# Patient Record
Sex: Female | Born: 1955 | Race: White | Hispanic: No | Marital: Married | Smoking: Former smoker
Health system: Southern US, Community
[De-identification: ages and names within clinical notes are randomized; demographics above are authoritative.]

## PROBLEM LIST (undated history)

## (undated) DIAGNOSIS — R911 Solitary pulmonary nodule: Secondary | ICD-10-CM

## (undated) DIAGNOSIS — C801 Malignant (primary) neoplasm, unspecified: Secondary | ICD-10-CM

## (undated) HISTORY — DX: Solitary pulmonary nodule: R91.1

---

## 2021-03-26 ENCOUNTER — Encounter: Payer: Self-pay | Admitting: Emergency Medicine

## 2021-03-26 ENCOUNTER — Ambulatory Visit
Admission: EM | Admit: 2021-03-26 | Discharge: 2021-03-26 | Disposition: A | Discharge status: Home / Self Care | Payer: 59 | Attending: Family Medicine | Admitting: Family Medicine

## 2021-03-26 ENCOUNTER — Ambulatory Visit (INDEPENDENT_AMBULATORY_CARE_PROVIDER_SITE_OTHER): Payer: 59

## 2021-03-26 DIAGNOSIS — J189 Pneumonia, unspecified organism: Secondary | ICD-10-CM

## 2021-03-26 DIAGNOSIS — R059 Cough, unspecified: Secondary | ICD-10-CM | POA: Diagnosis not present

## 2021-03-26 MED ORDER — AZITHROMYCIN 250 MG PO TABS: ORAL_TABLET | ORAL | 0 refills | Status: DC

## 2021-03-26 MED ORDER — AMOXICILLIN-POT CLAVULANATE 875-125 MG PO TABS: 1.0000 | ORAL_TABLET | Freq: Two times a day (BID) | ORAL | 0 refills | Status: DC

## 2021-03-26 MED ORDER — BENZONATATE 200 MG PO CAPS: 200.0000 mg | ORAL_CAPSULE | Freq: Three times a day (TID) | ORAL | 0 refills | Status: DC | PRN

## 2021-03-26 NOTE — Discharge Instructions (Signed)
Antibiotic as prescribed.  Recommend follow up in 3-4 weeks.  Take care  Dr. Lacinda Axon

## 2021-03-26 NOTE — ED Triage Notes (Signed)
Patient c/o cough that started 1 month ago. She denies any other symptoms.

## 2021-03-28 NOTE — ED Provider Notes (Signed)
[facility]    CSN: [id number] Arrival date & time: 03/26/21  1544      History   Chief Complaint Chief Complaint  Patient presents with  . Cough   HPI  65 year old female presents with cough.  Patient reports a 1 month history of cough.  She states that she produces some clear sputum.  No fever.  No shortness of breath.  She states that she has tried over-the-counter medications and has had no relief in her cough.  She cannot seem to get it to go away.  She is a smoker.  No documented history of COPD.  Does not use any inhalers currently.  Denies any other symptoms.  No other complaints.  Home Medications    Prior to Admission medications   Medication Sig Start Date End Date Taking? Authorizing Provider  amoxicillin-clavulanate (AUGMENTIN) 875-125 MG tablet Take 1 tablet by mouth 2 (two) times daily. 03/26/21  Yes Ewing Fandino G, DO  azithromycin (ZITHROMAX) 250 MG tablet 2 tablets on day 1, then 1 tablet daily on days 2-5. 03/26/21  Yes Tequan Redmon G, DO  benzonatate (TESSALON) 200 MG capsule Take 1 capsule (200 mg total) by mouth 3 (three) times daily as needed for cough. 03/26/21  Yes Coral Spikes, DO   Social History Social History   Tobacco Use  . Smoking status: Current Every Day Smoker  . Smokeless tobacco: Never Used  Vaping Use  . Vaping Use: Never used  Substance Use Topics  . Alcohol use: Never  . Drug use: Never     Allergies   Patient has no known allergies.   Review of Systems Review of Systems  Constitutional: Negative for fever.  Respiratory: Positive for cough.    Physical Exam Triage Vital Signs ED Triage Vitals  Enc Vitals Group     BP 03/26/21 1707 (!) 155/70     Pulse Rate 03/26/21 1707 86     Resp 03/26/21 1707 18     Temp 03/26/21 1707 98.3 F (36.8 C)     Temp Source 03/26/21 1707 Oral     SpO2 03/26/21 1707 97 %     Weight --      Height 03/26/21 1705 4\' 11"  (1.499 m)     Head Circumference --      Peak Flow --       Pain Score 03/26/21 1705 0     Pain Loc --      Pain Edu? --      Excl. in [place]? --    Updated Vital Signs BP (!) 155/70 (BP Location: Left Arm)   Pulse 86   Temp 98.3 F (36.8 C) (Oral)   Resp 18   Ht 4\' 11"  (1.499 m)   SpO2 97%   Visual Acuity Right Eye Distance:   Left Eye Distance:   Bilateral Distance:    Right Eye Near:   Left Eye Near:    Bilateral Near:     Physical Exam Constitutional:      General: She is not in acute distress.    Appearance: Normal appearance. She is not ill-appearing.  HENT:     Head: Normocephalic and atraumatic.  Eyes:     General:        Right eye: No discharge.        Left eye: No discharge.     Conjunctiva/sclera: Conjunctivae normal.  Cardiovascular:     Rate and Rhythm: Normal rate and regular rhythm.  Pulmonary:  Effort: Pulmonary effort is normal.     Breath sounds: No wheezing.  Neurological:     Mental Status: She is alert.  Psychiatric:        Mood and Affect: Mood normal.        Behavior: Behavior normal.    UC Treatments / Results  Labs (all labs ordered are listed, but only abnormal results are displayed) Labs Reviewed - No data to display  EKG   Radiology DG Chest 2 View  Result Date: 03/26/2021 CLINICAL DATA:  Cough for 1 month. EXAM: CHEST - 2 VIEW COMPARISON:  None. FINDINGS: Heart size and mediastinal contours appear normal. There is airspace opacification within the right middle lobe concerning for pneumonia. Left lung is clear. Visualized osseous structures are unremarkable. IMPRESSION: Right middle lobe pneumonia. Followup PA and lateral chest X-ray is recommended in 3-4 weeks following trial of antibiotic therapy to ensure resolution and exclude underlying malignancy. Electronically Signed   By: Kerby Moors M.D.   On: 03/26/2021 17:56    Procedures Procedures (including critical care time)  Medications Ordered in UC Medications - No data to display  Initial Impression / Assessment and Plan / UC  Course  I have reviewed the triage vital signs and the nursing notes.  Pertinent labs & imaging results that were available during my care of the patient were reviewed by me and considered in my medical decision making (see chart for details).    65 year old female presents with cough.  Chest x-ray obtained today.  Independent interpretation: Right middle lobe pneumonia.  Patient placed on Augmentin and azithromycin.  Tessalon Perles for cough.  Advised her that she needs follow-up chest x-ray in 3 to 4 weeks to ensure resolution.  Final Clinical Impressions(s) / UC Diagnoses   Final diagnoses:  Community acquired pneumonia of right middle lobe of lung     Discharge Instructions     Antibiotic as prescribed.  Recommend follow up in 3-4 weeks.  Take care  Dr. Lacinda Axon    ED Prescriptions    Medication Sig Dispense Auth. Provider   amoxicillin-clavulanate (AUGMENTIN) 875-125 MG tablet Take 1 tablet by mouth 2 (two) times daily. 20 tablet Xzayvier Fagin G, DO   azithromycin (ZITHROMAX) 250 MG tablet 2 tablets on day 1, then 1 tablet daily on days 2-5. 6 tablet Braylea Brancato G, DO   benzonatate (TESSALON) 200 MG capsule Take 1 capsule (200 mg total) by mouth 3 (three) times daily as needed for cough. 30 capsule Coral Spikes, DO     PDMP not reviewed this encounter.   Coral Spikes, [place] 03/28/21 [phone]

## 2021-04-12 ENCOUNTER — Ambulatory Visit
Admission: RE | Admit: 2021-04-12 | Discharge: 2021-04-12 | Disposition: A | Discharge status: Home / Self Care | Payer: 59 | Source: Ambulatory Visit | Attending: Physician Assistant | Admitting: Physician Assistant

## 2021-04-12 ENCOUNTER — Ambulatory Visit (INDEPENDENT_AMBULATORY_CARE_PROVIDER_SITE_OTHER): Payer: 59

## 2021-04-12 ENCOUNTER — Ambulatory Visit (INDEPENDENT_AMBULATORY_CARE_PROVIDER_SITE_OTHER)
Admission: RE | Admit: 2021-04-12 | Discharge: 2021-04-12 | Disposition: A | Discharge status: Home / Self Care | Payer: 59 | Source: Ambulatory Visit | Attending: Emergency Medicine | Admitting: Emergency Medicine

## 2021-04-12 ENCOUNTER — Other Ambulatory Visit: Payer: Self-pay

## 2021-04-12 VITALS — BP 137/54 | HR 92 | Temp 98.4°F | Resp 22 | Ht 59.0 in

## 2021-04-12 DIAGNOSIS — R053 Chronic cough: Secondary | ICD-10-CM

## 2021-04-12 DIAGNOSIS — R059 Cough, unspecified: Secondary | ICD-10-CM

## 2021-04-12 DIAGNOSIS — Z720 Tobacco use: Secondary | ICD-10-CM | POA: Diagnosis not present

## 2021-04-12 DIAGNOSIS — R918 Other nonspecific abnormal finding of lung field: Secondary | ICD-10-CM

## 2021-04-12 DIAGNOSIS — J438 Other emphysema: Secondary | ICD-10-CM | POA: Diagnosis not present

## 2021-04-12 MED ORDER — LEVOFLOXACIN 750 MG PO TABS: 750.0000 mg | ORAL_TABLET | Freq: Every day | ORAL | 0 refills | Status: AC

## 2021-04-12 MED ORDER — ALBUTEROL SULFATE HFA 108 (90 BASE) MCG/ACT IN AERS: 1.0000 | INHALATION_SPRAY | Freq: Four times a day (QID) | RESPIRATORY_TRACT | 0 refills | Status: DC | PRN

## 2021-04-12 NOTE — ED Notes (Signed)
PA# for CT scan [id number]

## 2021-04-12 NOTE — Discharge Instructions (Signed)
You have a lung mass.  This may be infectious but could be something else such as a cancer.  Additionally you do have emphysema.  This is a lung disease due to smoking.  You need to stop smoking.  You need to see a pulmonologist.  I have placed a stat referral for you to see a pulmonologist.  There is a contact number on there.  If you have not heard anything by Wednesday you should call that number.  I have sent in a stronger antibiotic to treat for potential infection.  Have also sent an inhaler for you to use if you feel short of breath.  If any severe acute worsening of symptoms such as acute worsening of your cough, blood in the sputum, chest pain, shortness of breath or wheezing or fatigue you need to call 911 or go to the ER.

## 2021-04-12 NOTE — ED Provider Notes (Signed)
[facility]    CSN: [id number] Arrival date & time: 04/12/21  1457      History   Chief Complaint Chief Complaint  Patient presents with  . Cough    HPI Cathy Glover is a 65 y.o. female presenting for approximately 1.9-month history of cough.  Cough is occasionally productive.  Patient seen at [facility] urgent care on 03/26/2021 and had a chest x-ray which showed right middle lobe consolidation.  Treated with Augmentin and azithromycin for suspected pneumonia.  Patient says that she does not feel any better after completing antibiotics and does feel little worse.  Over the past couple of days she is started to have some voice hoarseness.  She denies any fevers or fatigue.  No chest pain or tightness.  No wheezing or breathing difficulty.  Patient denies any history of COPD or asthma.  No history of heart disease.  Patient is a current longtime smoker.  States that she smoked about half a pack a day for nearly 30 years.  She never had any COVID exposure.  Patient says she does not have any known medical problems, but she also does not have a PCP or received regular physicals.  She denies any other symptoms or complaints at this time.  HPI  History reviewed. No pertinent past medical history.  There are no problems to display for this patient.   History reviewed. No pertinent surgical history.  OB History   No obstetric history on file.      Home Medications    Prior to Admission medications   Medication Sig Start Date End Date Taking? Authorizing Provider  albuterol (VENTOLIN HFA) 108 (90 Base) MCG/ACT inhaler Inhale 1-2 puffs into the lungs every 6 (six) hours as needed for wheezing or shortness of breath. 04/12/21 04/12/22 Yes Danton Clap, PA-C  levofloxacin (LEVAQUIN) 750 MG tablet Take 1 tablet (750 mg total) by mouth daily for 5 days. 04/12/21 04/17/21 Yes Danton Clap, PA-C  benzonatate (TESSALON) 200 MG capsule Take 1 capsule (200 mg total) by mouth 3 (three)  times daily as needed for cough. 03/26/21   Coral Spikes, DO    Family History No family history on file.  Social History Social History   Tobacco Use  . Smoking status: Current Every Day Smoker  . Smokeless tobacco: Never Used  Vaping Use  . Vaping Use: Never used  Substance Use Topics  . Alcohol use: Never  . Drug use: Never     Allergies   Patient has no known allergies.   Review of Systems Review of Systems  Constitutional: Negative for chills, diaphoresis, fatigue and fever.  HENT: Positive for voice change. Negative for congestion, ear pain, rhinorrhea, sinus pressure, sinus pain and sore throat.   Respiratory: Positive for cough. Negative for shortness of breath and wheezing.   Cardiovascular: Negative for chest pain.  Gastrointestinal: Negative for abdominal pain, nausea and vomiting.  Musculoskeletal: Negative for arthralgias and myalgias.  Skin: Negative for rash.  Neurological: Negative for weakness and headaches.  Hematological: Negative for adenopathy.     Physical Exam Triage Vital Signs ED Triage Vitals  Enc Vitals Group     BP 04/12/21 1514 (!) 137/54     Pulse Rate 04/12/21 1514 92     Resp 04/12/21 1514 (!) 22     Temp 04/12/21 1514 98.4 F (36.9 C)     Temp Source 04/12/21 1514 Oral     SpO2 04/12/21 1514 93 %  Weight --      Height 04/12/21 1512 4\' 11"  (1.499 m)     Head Circumference --      Peak Flow --      Pain Score 04/12/21 1512 0     Pain Loc --      Pain Edu? --      Excl. in [place]? --    No data found.  Updated Vital Signs BP (!) 137/54 (BP Location: Right Arm)   Pulse 92   Temp 98.4 F (36.9 C) (Oral)   Resp (!) 22   Ht 4\' 11"  (1.499 m)   SpO2 93%       Physical Exam Vitals and nursing note reviewed.  Constitutional:      General: She is not in acute distress.    Appearance: Normal appearance. She is not ill-appearing or toxic-appearing.  HENT:     Head: Normocephalic and atraumatic.     Nose: Nose normal.      Mouth/Throat:     Mouth: Mucous membranes are moist.     Pharynx: Oropharynx is clear.  Eyes:     General: No scleral icterus.       Right eye: No discharge.        Left eye: No discharge.     Conjunctiva/sclera: Conjunctivae normal.  Cardiovascular:     Rate and Rhythm: Normal rate and regular rhythm.     Heart sounds: Normal heart sounds.  Pulmonary:     Effort: Pulmonary effort is normal. No respiratory distress.     Breath sounds: Wheezing (few scattered wheezes throughout) present.  Musculoskeletal:     Cervical back: Neck supple.  Skin:    General: Skin is dry.  Neurological:     General: No focal deficit present.     Mental Status: She is alert. Mental status is at baseline.     Motor: No weakness.     Gait: Gait normal.  Psychiatric:        Mood and Affect: Mood normal.        Behavior: Behavior normal.        Thought Content: Thought content normal.      UC Treatments / Results  Labs (all labs ordered are listed, but only abnormal results are displayed) Labs Reviewed - No data to display  EKG   Radiology DG Chest 2 View  Result Date: 04/12/2021 CLINICAL DATA:  Chronic cough. EXAM: CHEST - 2 VIEW COMPARISON:  Chest x-ray dated Mar 26, 2021. FINDINGS: The heart size and mediastinal contours are within normal limits. Normal pulmonary vascularity. Persistent and mildly worsened consolidation in the right middle lobe. No pleural effusion or pneumothorax. No acute osseous abnormality. IMPRESSION: 1. Mildly worsened consolidation in the right middle lobe. Given persistence, further evaluation with chest CT is recommended. Electronically Signed   By: Titus Dubin M.D.   On: 04/12/2021 15:44   CT Chest Wo Contrast  Result Date: 04/12/2021 CLINICAL DATA:  Respiratory illness. Right middle lobe consolidation. EXAM: CT CHEST WITHOUT CONTRAST TECHNIQUE: Multidetector CT imaging of the chest was performed following the standard protocol without IV contrast. COMPARISON:   None. FINDINGS: Cardiovascular: The heart size is normal. Heart size is within normal limits. Small pericardial effusion noted. Coronary artery calcification is evident. Atherosclerotic calcification is noted in the wall of the thoracic aorta. Mediastinum/Nodes: Mediastinal lymphadenopathy evident including 17 mm right paratracheal node on 47/2. Bulky 2.8 cm short axis subcarinal lymph node evident. Right hilar lymphadenopathy noted. There may be some  lymphadenopathy in left hilum is well. There is no axillary lymphadenopathy. Lungs/Pleura: Centrilobular emphsyema noted. Multifocal ill-defined nodular consolidative disease is identified in the central right upper lobe, central right middle lobe, and left lower lobe. There is associated volume loss and consolidative opacity in the right middle and lower lobes. Index right lower lobe nodular component measures 2.0 x 1.6 cm on image 77/3. No pleural effusion. Upper Abdomen: 19 mm low-density lesion upper pole right kidney measures water density, likely a cyst. 2.1 cm left adrenal nodule has attenuation too high to be characterized as an adenoma. Musculoskeletal: No worrisome lytic or sclerotic osseous abnormality. IMPRESSION: 1. Multifocal ill-defined nodular consolidative disease in the central right upper lobe, central right middle lobe, and left lower lobe. Index right lower lobe nodular component measures up to 2.0 cm. Findings are associated with bulky mediastinal and right hilar lymphadenopathy. Given persistence, multifocal neoplasm (adenocarcinoma) is a concern. Infectious etiology not excluded. Pulmonary consultation recommended and close follow-up warranted. 2. 2.1 cm left adrenal nodule has attenuation too high to be characterized as an adenoma. Close follow-up recommended. 3. Aortic Atherosclerosis (ICD10-I70.0) and Emphysema (ICD10-J43.9). Electronically Signed   By: Misty Stanley M.D.   On: 04/12/2021 17:29    Procedures Procedures (including critical  care time)  Medications Ordered in UC Medications - No data to display  Initial Impression / Assessment and Plan / UC Course  I have reviewed the triage vital signs and the nursing notes.  Pertinent labs & imaging results that were available during my care of the patient were reviewed by me and considered in my medical decision making (see chart for details).    65 year old female returning for continued cough.  Patient seen about 2 weeks ago and diagnosed with pneumonia based on consolidation of the right middle lobe.  Patient says she has not gotten any better and feels little worse.  New onset of voice hoarseness started yesterday.  No other new symptoms.  She is denying any shortness of breath or breathing difficulty.  Oxygen at last visit was 97% oxygen saturation today is 93%.  She is not in any respiratory distress.  Exam reveals a few wheezes here and there throughout chest.  Heart regular rate and rhythm.  She is not ill-appearing.  Repeat chest x-ray today shows mildly worsening consolidation in the right middle lobe.  Radiologist recommends chest CT.  I spoke with radiologist Dr. Nelia Shi and he stated that chest CT without contrast would be a fine test ordered.  I have order chest CT without contrast to further characterize this area of consolidation to assess if this looks like pneumonia, abscess, or possible malignancy.  CT today shows evidence of emphysema and multifocal ill-defined nodular consolidative disease in the right upper, right middle and left lower lobes.  Radiologist states that this is concerning for malignant neoplasm specifically adenocarcinoma.  Infectious etiology is not excluded either.  They recommend follow-up with pulmonologist.  I have reviewed the CT results with patient.  Advised her this could be infection versus malignancy.  I have placed a stat referral to pulmonologist for further work-up.  I have sent in Levaquin in case of infectious etiology and also  albuterol as needed for any shortness of breath but she is denying shortness of breath.  Reviewed ED red flag signs and symptoms with patient.  Encouraged her to stop smoking.  Advised her that it is very important that she follow through with the pulmonology visit.  Advised her to contact pulmonologist next  week if she has not heard anything to let us know so we can place another referral if needed.  Patient is agreeable.   Final Clinical Impressions(s) / UC Diagnoses   Final diagnoses:  Lung mass  Cough  Other emphysema (HCC)  Tobacco abuse disorder     Discharge Instructions     You have a lung mass.  This may be infectious but could be something else such as a cancer.  Additionally you do have emphysema.  This is a lung disease due to smoking.  You need to stop smoking.  You need to see a pulmonologist.  I have placed a stat referral for you to see a pulmonologist.  There is a contact number on there.  If you have not heard anything by Wednesday you should call that number.  I have sent in a stronger antibiotic to treat for potential infection.  Have also sent an inhaler for you to use if you feel short of breath.  If any severe acute worsening of symptoms such as acute worsening of your cough, blood in the sputum, chest pain, shortness of breath or wheezing or fatigue you need to call 911 or go to the ER.    ED Prescriptions    Medication Sig Dispense Auth. Provider   levofloxacin (LEVAQUIN) 750 MG tablet Take 1 tablet (750 mg total) by mouth daily for 5 days. 5 tablet Laurene Footman B, PA-C   albuterol (VENTOLIN HFA) 108 (90 Base) MCG/ACT inhaler Inhale 1-2 puffs into the lungs every 6 (six) hours as needed for wheezing or shortness of breath. 1 g Danton Clap, PA-C     PDMP not reviewed this encounter.   Danton Clap, PA-C 04/12/21 [phone]

## 2021-04-12 NOTE — ED Triage Notes (Signed)
Patient c/o cough that started 1.5 months ago. Reports she was diagnosed with pneumonia on 05/16 and given Augmentin. She states she completed her antibiotic and still continues to cough, no improvement.

## 2021-04-17 ENCOUNTER — Telehealth: Payer: Self-pay | Admitting: Physician Assistant

## 2021-04-17 ENCOUNTER — Encounter: Payer: Self-pay | Admitting: *Deleted

## 2021-04-17 DIAGNOSIS — R918 Other nonspecific abnormal finding of lung field: Secondary | ICD-10-CM

## 2021-04-17 NOTE — Progress Notes (Signed)
Referral received for patient to establish care with oncology for lung mass seen on recent CT scan. Pt has an appt with pulmonary in July. Spoke with Dr. Patsey Berthold who recommends for patient to come in sooner. Appt rescheduled to 6/8 at 1:30pm and pt made aware by clinic staff. Pt will need PET scan and authorization with insurance is currently pending. Once approved, will schedule patient for PET scan and oncology consult.   Attempted to contact pt to update but pt did not answer and there was not any voicemail set up to leave a message. Will try again.

## 2021-04-17 NOTE — Telephone Encounter (Signed)
Patient awaiting pulmonology appointment in July 2022. Placing urgent referral to heme/onc as well.

## 2021-04-18 ENCOUNTER — Other Ambulatory Visit: Payer: Self-pay

## 2021-04-18 ENCOUNTER — Ambulatory Visit (INDEPENDENT_AMBULATORY_CARE_PROVIDER_SITE_OTHER): Payer: 59 | Admitting: Pulmonary Disease

## 2021-04-18 ENCOUNTER — Encounter: Payer: Self-pay | Admitting: Pulmonary Disease

## 2021-04-18 ENCOUNTER — Telehealth: Payer: Self-pay | Admitting: Pulmonary Disease

## 2021-04-18 VITALS — BP 128/60 | HR 82 | Temp 98.4°F | Ht 59.0 in | Wt 111.0 lb

## 2021-04-18 DIAGNOSIS — R590 Localized enlarged lymph nodes: Secondary | ICD-10-CM | POA: Diagnosis not present

## 2021-04-18 DIAGNOSIS — Z87891 Personal history of nicotine dependence: Secondary | ICD-10-CM

## 2021-04-18 DIAGNOSIS — R058 Other specified cough: Secondary | ICD-10-CM | POA: Diagnosis not present

## 2021-04-18 DIAGNOSIS — J181 Lobar pneumonia, unspecified organism: Secondary | ICD-10-CM | POA: Diagnosis not present

## 2021-04-18 NOTE — H&P (View-Only) (Signed)
Subjective:    Patient ID: Cathy Glover, female    DOB: 1956/09/14, 65 y.o.   MRN: [id number] Chief Complaint  Patient presents with   pulmonary consult    CT 04/12/2021. C/o occ sob with exertion and prod cough with clear sputum and wheezing with coughing spell.    HPI  Patient is a 65 year old recent former smoker (quit 1 week ago) with a 25-pack-year history of smoking who presents for evaluation of a lung mass with mediastinal adenopathy.  The patient is referred by the [facility] and the [place].  The patient presents today with her husband who is present during the entirety of the visit.  Per the patient and the husband she developed a cough approximately 2 years ago.  Mostly in the mornings.  They attributed this to "smoker's cough".  The cough has been for the most part nonproductive, no hemoptysis.  Occasionally she will have production of whitish to grayish sputum.  Approximately 6 months ago her cough got worse.  It has not been associated with fevers, chills or sweats.  She has not had any weight loss.  No anorexia.  She was evaluated at the urgent care on May 16 and at that time a chest x-ray showed consolidative process on the right lower lobe.  She was treated with Augmentin and azithromycin as an outpatient and was also given Tessalon Perles.  She initially improved somewhat but then her symptoms worsened again and she was again evaluated on 2 June at the urgent care and [place].  By that time the patient had started to develop some hoarseness as well.  A chest x-ray at that time showed that the consolidation noted previously was more pronounced.  A CT was recommended, CT done the same day showed "ill-defined nodular consolidative disease in the central right upper lobe, middle lobe and left lower lobe.  Additionally the patient have bulky mediastinal adenopathy and right hilar adenopathy.  There is also evidence of an adrenal nodule that is not consistent with adenoma.   Given these findings the likelihood of carcinoma is high.  The patient was then seen at the [facility] emergency room on 7 June because of worsening cough.  She was instructed to keep up her follow-up appointment with [place] today.  The patient states that her cough actually does improve with Mucinex DM.  She is also using as needed albuterol and this helps as well.  She does not endorse any other symptomatology.  She does not have a regular physician and does not keep up with regular medical follow-ups.  We did show the patient and her husband all her imaging and the rationale of need for biopsy.  They are aware of the need for diagnosis ASAP.  Review of Systems A 10 point review of systems was performed and it is as noted above otherwise negative.  Past Medical History:  Diagnosis Date   Pulmonary nodule    No past surgical history on file.  Family History  Problem Relation Age of Onset   Cancer Sister    Social History   Tobacco Use   Smoking status: Former Smoker    Packs/day: 0.50    Years: 50.00    Pack years: 25.00    Quit date: 04/11/2021    Years since quitting: 0.0   Smokeless tobacco: Never Used  Substance Use Topics   Alcohol use: Never   No Known Allergies  Current Meds  Medication Sig   albuterol (VENTOLIN HFA)  108 (90 Base) MCG/ACT inhaler Inhale 1-2 puffs into the lungs every 6 (six) hours as needed for wheezing or shortness of breath.   dextromethorphan-guaiFENesin (MUCINEX DM) 30-600 MG 12hr tablet Take 1 tablet by mouth 2 (two) times daily.    There is no immunization history on file for this patient. We discussed Covid-19 precautions.  I reviewed the vaccine effectiveness and potential side effects in detail to include differences between mRNA vaccines and traditional vaccines (attenuated virus).  Discussion also offered of long-term effectiveness and safety profile which are unclear at this time.  Discussed current CDC guidance that all patients are recommended  COVID-19 vaccinations -as long as they do not have allergy to components of the vaccine.  .    Objective:   Physical Exam BP 128/60 (BP Location: Left Arm, Cuff Size: Normal)   Pulse 82   Temp 98.4 F (36.9 C) (Temporal)   Ht 4\' 11"  (1.499 m)   Wt 111 lb (50.3 kg)   SpO2 94%   BMI 22.42 kg/m  GENERAL: Well-developed, well-nourished woman, no acute distress.  Looks older than stated age.  Fully ambulatory.  Occasionally befuddled. HEAD: Normocephalic, atraumatic.  EYES: Pupils equal, round, reactive to light.  No scleral icterus.  MOUTH: Nose/mouth/throat not examined due to masking requirements for COVID 19. NECK: Supple. No thyromegaly. Trachea midline. No JVD.  No adenopathy. PULMONARY: Good air entry bilaterally.  Coarse, otherwise, no adventitious sounds. CARDIOVASCULAR: S1 and S2. Regular rate and rhythm.  No rubs, murmurs or gallops heard. ABDOMEN: Benign. MUSCULOSKELETAL: No joint deformity, no clubbing, no edema.  NEUROLOGIC: No focal deficit, no gait disturbance, speech is fluent. SKIN: Intact,warm,dry.  On limited exam no rashes. PSYCH: Flat affect, occasionally befuddled, normal behavior.  Representative images of the CT performed 12 April 2021:      Assessment & Plan:     ICD-10-CM   1. Mediastinal adenopathy  R59.0    Patient has bulky mediastinal adenopathy which is carcinoma until proven otherwise Will need diagnosis with biopsy Patient has been scheduled for EBUS/TBNA     2. 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 Cough present for greater than 3 weeks  R05.8    Likely related to chest process as above    4. Former moderate cigarette smoker (10-19 per day)  Z87.891    Recent former smoker Discussed strategies to avoid relapse     Discussion:   The patient has carcinoma until proven otherwise.  The benefits, limitations and complications of  endobronchial ultrasound with biopsy were explained to the patient and her husband.  They agree to go ahead with the procedure.  It has been scheduled for 25 April 2021.  Benefits, limitations and potential complications of the procedure were discussed with the patient/family  including, but not limited to bleeding, hemoptysis, respiratory failure requiring intubation and/or prolongued mechanical ventilation, infection, pneumothorax (collapse of lung) requiring chest tube placement, stroke or even death.  Patient and husband agree for the patient to procedure.   We will coordinate patient's follow-up with oncology.  Renold Don, MD [place] [facility]   *This note was dictated using voice recognition software/Dragon.  Despite best efforts to proofread, errors can occur which can change the meaning.  Any change was purely unintentional.

## 2021-04-18 NOTE — Telephone Encounter (Signed)
EBUS scheduled for 04/25/2021 at 12:30. [id number]  EB:[id number] adenopathy   Rodena Piety, please see bronch info

## 2021-04-18 NOTE — Progress Notes (Signed)
Subjective:    Patient ID: Cathy Glover, female    DOB: 05-23-1956, 65 y.o.   MRN: [id number] Chief Complaint  Patient presents with   pulmonary consult    CT 04/12/2021. C/o occ sob with exertion and prod cough with clear sputum and wheezing with coughing spell.    HPI  Patient is a 65 year old recent former smoker (quit 1 week ago) with a 25-pack-year history of smoking who presents for evaluation of a lung mass with mediastinal adenopathy.  The patient is referred by the [facility] and the [place].  The patient presents today with her husband who is present during the entirety of the visit.  Per the patient and the husband she developed a cough approximately 2 years ago.  Mostly in the mornings.  They attributed this to "smoker's cough".  The cough has been for the most part nonproductive, no hemoptysis.  Occasionally she will have production of whitish to grayish sputum.  Approximately 6 months ago her cough got worse.  It has not been associated with fevers, chills or sweats.  She has not had any weight loss.  No anorexia.  She was evaluated at the urgent care on May 16 and at that time a chest x-ray showed consolidative process on the right lower lobe.  She was treated with Augmentin and azithromycin as an outpatient and was also given Tessalon Perles.  She initially improved somewhat but then her symptoms worsened again and she was again evaluated on 2 June at the urgent care and [place].  By that time the patient had started to develop some hoarseness as well.  A chest x-ray at that time showed that the consolidation noted previously was more pronounced.  A CT was recommended, CT done the same day showed "ill-defined nodular consolidative disease in the central right upper lobe, middle lobe and left lower lobe.  Additionally the patient have bulky mediastinal adenopathy and right hilar adenopathy.  There is also evidence of an adrenal nodule that is not consistent with adenoma.   Given these findings the likelihood of carcinoma is high.  The patient was then seen at the [facility] emergency room on 7 June because of worsening cough.  She was instructed to keep up her follow-up appointment with [place] today.  The patient states that her cough actually does improve with Mucinex DM.  She is also using as needed albuterol and this helps as well.  She does not endorse any other symptomatology.  She does not have a regular physician and does not keep up with regular medical follow-ups.  We did show the patient and her husband all her imaging and the rationale of need for biopsy.  They are aware of the need for diagnosis ASAP.  Review of Systems A 10 point review of systems was performed and it is as noted above otherwise negative.  Past Medical History:  Diagnosis Date   Pulmonary nodule    No past surgical history on file.  Family History  Problem Relation Age of Onset   Cancer Sister    Social History   Tobacco Use   Smoking status: Former Smoker    Packs/day: 0.50    Years: 50.00    Pack years: 25.00    Quit date: 04/11/2021    Years since quitting: 0.0   Smokeless tobacco: Never Used  Substance Use Topics   Alcohol use: Never   No Known Allergies  Current Meds  Medication Sig   albuterol (VENTOLIN HFA)  108 (90 Base) MCG/ACT inhaler Inhale 1-2 puffs into the lungs every 6 (six) hours as needed for wheezing or shortness of breath.   dextromethorphan-guaiFENesin (MUCINEX DM) 30-600 MG 12hr tablet Take 1 tablet by mouth 2 (two) times daily.    There is no immunization history on file for this patient. We discussed Covid-19 precautions.  I reviewed the vaccine effectiveness and potential side effects in detail to include differences between mRNA vaccines and traditional vaccines (attenuated virus).  Discussion also offered of long-term effectiveness and safety profile which are unclear at this time.  Discussed current CDC guidance that all patients are recommended  COVID-19 vaccinations -as long as they do not have allergy to components of the vaccine.  .    Objective:   Physical Exam BP 128/60 (BP Location: Left Arm, Cuff Size: Normal)   Pulse 82   Temp 98.4 F (36.9 C) (Temporal)   Ht 4\' 11"  (1.499 m)   Wt 111 lb (50.3 kg)   SpO2 94%   BMI 22.42 kg/m  GENERAL: Well-developed, well-nourished woman, no acute distress.  Looks older than stated age.  Fully ambulatory.  Occasionally befuddled. HEAD: Normocephalic, atraumatic.  EYES: Pupils equal, round, reactive to light.  No scleral icterus.  MOUTH: Nose/mouth/throat not examined due to masking requirements for COVID 19. NECK: Supple. No thyromegaly. Trachea midline. No JVD.  No adenopathy. PULMONARY: Good air entry bilaterally.  Coarse, otherwise, no adventitious sounds. CARDIOVASCULAR: S1 and S2. Regular rate and rhythm.  No rubs, murmurs or gallops heard. ABDOMEN: Benign. MUSCULOSKELETAL: No joint deformity, no clubbing, no edema.  NEUROLOGIC: No focal deficit, no gait disturbance, speech is fluent. SKIN: Intact,warm,dry.  On limited exam no rashes. PSYCH: Flat affect, occasionally befuddled, normal behavior.  Representative images of the CT performed 12 April 2021:      Assessment & Plan:     ICD-10-CM   1. Mediastinal adenopathy  R59.0    Patient has bulky mediastinal adenopathy which is carcinoma until proven otherwise Will need diagnosis with biopsy Patient has been scheduled for EBUS/TBNA     2. 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 Cough present for greater than 3 weeks  R05.8    Likely related to chest process as above    4. Former moderate cigarette smoker (10-19 per day)  Z87.891    Recent former smoker Discussed strategies to avoid relapse     Discussion:   The patient has carcinoma until proven otherwise.  The benefits, limitations and complications of  endobronchial ultrasound with biopsy were explained to the patient and her husband.  They agree to go ahead with the procedure.  It has been scheduled for 25 April 2021.  Benefits, limitations and potential complications of the procedure were discussed with the patient/family  including, but not limited to bleeding, hemoptysis, respiratory failure requiring intubation and/or prolongued mechanical ventilation, infection, pneumothorax (collapse of lung) requiring chest tube placement, stroke or even death.  Patient and husband agree for the patient to procedure.   We will coordinate patient's follow-up with oncology.  Renold Don, MD [place] [facility]   *This note was dictated using voice recognition software/Dragon.  Despite best efforts to proofread, errors can occur which can change the meaning.  Any change was purely unintentional.

## 2021-04-18 NOTE — Patient Instructions (Signed)
We are scheduling your procedure for a biopsy on 15 June  You may take Mucinex DM extra strength (1200 mg) 1 tablet twice a day as needed for cough.  If you have the regular strength tablets (600 mg) you can actually take 2 tablets twice a day.

## 2021-04-19 ENCOUNTER — Encounter: Payer: Self-pay | Admitting: Pulmonary Disease

## 2021-04-19 NOTE — Telephone Encounter (Signed)
Prior Auth Not Required for codes [phone], [id number] Refer # [id number]

## 2021-04-20 ENCOUNTER — Encounter: Payer: Self-pay | Admitting: *Deleted

## 2021-04-20 ENCOUNTER — Other Ambulatory Visit: Payer: Self-pay

## 2021-04-20 ENCOUNTER — Encounter
Admission: RE | Admit: 2021-04-20 | Discharge: 2021-04-20 | Disposition: A | Discharge status: Home / Self Care | Payer: 59 | Source: Ambulatory Visit | Attending: Pulmonary Disease | Admitting: Pulmonary Disease

## 2021-04-20 NOTE — Patient Instructions (Signed)
Your procedure is scheduled on: 04/25/21 Report to [facility]. To find out your arrival time please call [phone] between 1PM - 3PM on 04/24/21.  Remember: Instructions that are not followed completely may result in serious medical risk, up to and including death, or upon the discretion of your surgeon and anesthesiologist your surgery may need to be rescheduled.     _X__ 1. Do not eat food or drink any liquids after midnight the night before your procedure.                 No gum chewing or hard candies.   __X__2.  On the morning of surgery brush your teeth with toothpaste and water, you                 may rinse your mouth with mouthwash if you wish.  Do not swallow any              toothpaste of mouthwash.     _X__ 3.  No Alcohol for 24 hours before or after surgery.   _X__ 4.  Do Not Smoke or use e-cigarettes For 24 Hours Prior to Your Surgery.                 Do not use any chewable tobacco products for at least 6 hours prior to                 surgery.  ____  5.  Bring all medications with you on the day of surgery if instructed.   __X__  6.  Notify your doctor if there is any change in your medical condition      (cold, fever, infections).     Do not wear jewelry, make-up, hairpins, clips or nail polish. Do not wear lotions, powders, or perfumes.  Do not shave 48 hours prior to surgery. Men may shave face and neck. Do not bring valuables to the hospital.    [facility] is not responsible for any belongings or valuables.  Contacts, dentures/partials or body piercings may not be worn into surgery. Bring a case for your contacts, glasses or hearing aids, a denture cup will be supplied. Leave your suitcase in the car. After surgery it may be brought to your room. For patients admitted to the hospital, discharge time is determined by your treatment team.   Patients discharged the day of surgery will not be allowed to drive  home.   Please read over the following fact sheets that you were given:     __X__ Take these medicines the morning of surgery with A SIP OF WATER:    1. none  2.   3.   4.  5.  6.  ____ Fleet Enema (as directed)   ____ Use CHG Soap/SAGE wipes as directed  __X__ Use inhalers on the day of surgery [place]  ____ Stop metformin/Janumet/Farxiga 2 days prior to surgery    ____ Take 1/2 of usual insulin dose the night before surgery. No insulin the morning          of surgery.   ____ Stop Blood Thinners Coumadin/Plavix/Xarelto/Pleta/Pradaxa/Eliquis/Effient/Aspirin  on   Or contact your Surgeon, Cardiologist or Medical Doctor regarding  ability to stop your blood thinners  __X__ Stop Anti-inflammatories 7 days before surgery such as Advil, Ibuprofen, Motrin,  BC or Goodies Powder, Naprosyn, Naproxen, Aleve, Aspirin    __X__ Stop all herbal supplements, fish oil  or vitamin E until after surgery.    ____ Bring C-Pap to the hospital.

## 2021-04-20 NOTE — Progress Notes (Signed)
Phone call made to patient to review new patient appt with Dr. Rogue Bussing on 6/21. All questions answered during phone call. Contact info given and instructed to call with any further questions or needs. Pt verbalized understanding.

## 2021-04-20 NOTE — Telephone Encounter (Signed)
Phone pre admit 04/20/2021 at 2:30 and covid test 04/23/2021 at 8:25. Patient is aware of dates/times and voiced her understanding.  Nothing further needed at this time.

## 2021-04-23 ENCOUNTER — Other Ambulatory Visit
Admission: RE | Admit: 2021-04-23 | Discharge: 2021-04-23 | Disposition: A | Discharge status: Home / Self Care | Payer: 59 | Source: Ambulatory Visit | Attending: Pulmonary Disease | Admitting: Pulmonary Disease

## 2021-04-23 ENCOUNTER — Other Ambulatory Visit: Payer: 59

## 2021-04-23 ENCOUNTER — Other Ambulatory Visit: Payer: Self-pay

## 2021-04-23 ENCOUNTER — Other Ambulatory Visit: Admission: RE | Admit: 2021-04-23 | Payer: 59 | Source: Ambulatory Visit

## 2021-04-23 DIAGNOSIS — Z20822 Contact with and (suspected) exposure to covid-19: Secondary | ICD-10-CM | POA: Diagnosis not present

## 2021-04-23 DIAGNOSIS — Z01818 Encounter for other preprocedural examination: Secondary | ICD-10-CM | POA: Insufficient documentation

## 2021-04-23 LAB — SARS CORONAVIRUS 2 (TAT 6-24 HRS): SARS Coronavirus 2: NEGATIVE

## 2021-04-24 ENCOUNTER — Telehealth: Payer: Self-pay | Admitting: Pulmonary Disease

## 2021-04-24 NOTE — Telephone Encounter (Signed)
LMTCB  Will route message to [facility] since patient see's Dr Patsey Berthold.

## 2021-04-24 NOTE — Telephone Encounter (Signed)
Received call from [name] (husband) regardi-ng her medications in the morning prior to her procedure.  Advised he would need to call [phone] and ask to speak to someone in Endoscopy.  He verbalized understanding.  Nothing further needed.

## 2021-04-24 NOTE — Telephone Encounter (Signed)
Spoke to patient's spouse, Ricky(DPR). [name] is questioning which meds patient can take prior to bronch on 04/25/2021. Recommended that patient contact PAT regarding this. Provided Ricky with contact number.  [name] is aware that patient can not have anything to eat or drink after midnight tonight. He voiced his understanding and had no further questions. Nothing further needed at this time.

## 2021-04-25 ENCOUNTER — Encounter
Admission: RE | Disposition: A | Discharge status: Home / Self Care | Payer: Self-pay | Source: Home / Self Care | Attending: Pulmonary Disease

## 2021-04-25 ENCOUNTER — Ambulatory Visit: Payer: 59 | Admitting: Certified Registered"

## 2021-04-25 ENCOUNTER — Encounter: Payer: Self-pay | Admitting: Pulmonary Disease

## 2021-04-25 ENCOUNTER — Other Ambulatory Visit: Payer: Self-pay

## 2021-04-25 ENCOUNTER — Ambulatory Visit
Admission: RE | Admit: 2021-04-25 | Discharge: 2021-04-25 | Disposition: A | Discharge status: Home / Self Care | Payer: 59 | Attending: Pulmonary Disease | Admitting: Pulmonary Disease

## 2021-04-25 DIAGNOSIS — Z809 Family history of malignant neoplasm, unspecified: Secondary | ICD-10-CM | POA: Insufficient documentation

## 2021-04-25 DIAGNOSIS — C771 Secondary and unspecified malignant neoplasm of intrathoracic lymph nodes: Secondary | ICD-10-CM | POA: Diagnosis not present

## 2021-04-25 DIAGNOSIS — J9859 Other diseases of mediastinum, not elsewhere classified: Secondary | ICD-10-CM

## 2021-04-25 DIAGNOSIS — R918 Other nonspecific abnormal finding of lung field: Secondary | ICD-10-CM | POA: Diagnosis not present

## 2021-04-25 DIAGNOSIS — C3431 Malignant neoplasm of lower lobe, right bronchus or lung: Secondary | ICD-10-CM | POA: Insufficient documentation

## 2021-04-25 DIAGNOSIS — Z87891 Personal history of nicotine dependence: Secondary | ICD-10-CM | POA: Insufficient documentation

## 2021-04-25 DIAGNOSIS — Z7951 Long term (current) use of inhaled steroids: Secondary | ICD-10-CM | POA: Diagnosis not present

## 2021-04-25 HISTORY — PX: VIDEO BRONCHOSCOPY WITH ENDOBRONCHIAL ULTRASOUND: SHX6177

## 2021-04-25 SURGERY — BRONCHOSCOPY, WITH EBUS
Anesthesia: General

## 2021-04-25 MED ORDER — LACTATED RINGERS IV SOLN: INTRAVENOUS | Status: DC

## 2021-04-25 MED ORDER — ONDANSETRON HCL 4 MG/2ML IJ SOLN: 4.0000 mg | Freq: Once | INTRAMUSCULAR | Status: DC | PRN

## 2021-04-25 MED ORDER — DEXAMETHASONE SODIUM PHOSPHATE 10 MG/ML IJ SOLN
INTRAMUSCULAR | Status: DC | PRN
  Administered 2021-04-25: 10 mg via INTRAVENOUS

## 2021-04-25 MED ORDER — FAMOTIDINE 20 MG PO TABS
ORAL_TABLET | ORAL | Status: AC
  Administered 2021-04-25: 20 mg via ORAL
  Filled 2021-04-25: qty 1

## 2021-04-25 MED ORDER — IPRATROPIUM-ALBUTEROL 0.5-2.5 (3) MG/3ML IN SOLN
RESPIRATORY_TRACT | Status: AC
  Administered 2021-04-25: 3 mL via RESPIRATORY_TRACT
  Filled 2021-04-25: qty 3

## 2021-04-25 MED ORDER — CHLORHEXIDINE GLUCONATE 0.12 % MT SOLN: 15.0000 mL | Freq: Once | OROMUCOSAL | Status: AC

## 2021-04-25 MED ORDER — ACETAMINOPHEN 10 MG/ML IV SOLN
INTRAVENOUS | Status: DC | PRN
  Administered 2021-04-25: 1000 mg via INTRAVENOUS

## 2021-04-25 MED ORDER — LIDOCAINE HCL (CARDIAC) PF 100 MG/5ML IV SOSY
PREFILLED_SYRINGE | INTRAVENOUS | Status: DC | PRN
  Administered 2021-04-25: 50 mg via INTRAVENOUS

## 2021-04-25 MED ORDER — CHLORHEXIDINE GLUCONATE 0.12 % MT SOLN
OROMUCOSAL | Status: AC
  Administered 2021-04-25: 15 mL via OROMUCOSAL
  Filled 2021-04-25: qty 15

## 2021-04-25 MED ORDER — PHENYLEPHRINE HCL (PRESSORS) 10 MG/ML IV SOLN
INTRAVENOUS | Status: DC | PRN
  Administered 2021-04-25: 100 ug via INTRAVENOUS

## 2021-04-25 MED ORDER — FENTANYL CITRATE (PF) 100 MCG/2ML IJ SOLN
INTRAMUSCULAR | Status: DC | PRN
  Administered 2021-04-25: 50 ug via INTRAVENOUS
  Administered 2021-04-25 (×2): 25 ug via INTRAVENOUS

## 2021-04-25 MED ORDER — PROPOFOL 10 MG/ML IV BOLUS
INTRAVENOUS | Status: DC | PRN
  Administered 2021-04-25: 70 mg via INTRAVENOUS

## 2021-04-25 MED ORDER — PROPOFOL 500 MG/50ML IV EMUL
INTRAVENOUS | Status: AC
  Filled 2021-04-25: qty 50

## 2021-04-25 MED ORDER — FENTANYL CITRATE (PF) 100 MCG/2ML IJ SOLN
INTRAMUSCULAR | Status: AC
  Filled 2021-04-25: qty 2

## 2021-04-25 MED ORDER — SODIUM CHLORIDE 0.9 % IV SOLN: Freq: Once | INTRAVENOUS | Status: DC

## 2021-04-25 MED ORDER — FENTANYL CITRATE (PF) 100 MCG/2ML IJ SOLN: 25.0000 ug | INTRAMUSCULAR | Status: DC | PRN

## 2021-04-25 MED ORDER — ORAL CARE MOUTH RINSE: 15.0000 mL | Freq: Once | OROMUCOSAL | Status: AC

## 2021-04-25 MED ORDER — IPRATROPIUM-ALBUTEROL 0.5-2.5 (3) MG/3ML IN SOLN: 3.0000 mL | Freq: Once | RESPIRATORY_TRACT | Status: AC

## 2021-04-25 MED ORDER — ACETAMINOPHEN 10 MG/ML IV SOLN
INTRAVENOUS | Status: AC
  Filled 2021-04-25: qty 100

## 2021-04-25 MED ORDER — SUGAMMADEX SODIUM 200 MG/2ML IV SOLN
INTRAVENOUS | Status: DC | PRN
  Administered 2021-04-25: 200 mg via INTRAVENOUS

## 2021-04-25 MED ORDER — FAMOTIDINE 20 MG PO TABS: 20.0000 mg | ORAL_TABLET | Freq: Once | ORAL | Status: AC

## 2021-04-25 MED ORDER — LIDOCAINE HCL (PF) 2 % IJ SOLN
INTRAMUSCULAR | Status: AC
  Filled 2021-04-25: qty 4

## 2021-04-25 MED ORDER — ROCURONIUM BROMIDE 100 MG/10ML IV SOLN
INTRAVENOUS | Status: DC | PRN
  Administered 2021-04-25: 30 mg via INTRAVENOUS

## 2021-04-25 MED ORDER — ONDANSETRON HCL 4 MG/2ML IJ SOLN
INTRAMUSCULAR | Status: DC | PRN
  Administered 2021-04-25: 4 mg via INTRAVENOUS

## 2021-04-25 MED ORDER — HYDROCOD POLST-CPM POLST ER 10-8 MG/5ML PO SUER
5.0000 mL | Freq: Once | ORAL | Status: AC
  Administered 2021-04-25: 5 mL via ORAL
  Filled 2021-04-25: qty 5

## 2021-04-25 MED ORDER — AMOXICILLIN-POT CLAVULANATE 875-125 MG PO TABS: 1.0000 | ORAL_TABLET | Freq: Two times a day (BID) | ORAL | 0 refills | Status: AC

## 2021-04-25 MED ORDER — GLYCOPYRROLATE 0.2 MG/ML IJ SOLN
INTRAMUSCULAR | Status: DC | PRN
  Administered 2021-04-25: .1 mg via INTRAVENOUS

## 2021-04-25 NOTE — Op Note (Signed)
PROCEDURE:  Bronchoscopy with biopsies and brushings and BAL  Endobronchial ultrasound (EBUS) with TBNA   PROCEDURE DATE: 04/25/2021  TIME:  NAME:  Cathy Glover  DOB:06/08/1956  MRN: [id number] LOC:  ARPO/None    HOSP DAY: N/A CODE STATUS:    Full  Indications/Preliminary Diagnosis: Mediastinal adenopathy/mass, consolidative process in right lung, rule out CA  Consent: (Place X beside choice/s below)  The benefits, risks and possible complications of the procedure were        explained to:  _X__ patient  _X__ patient's family (husband)  ___ other:___________  who verbalized understanding and gave:  ___ verbal  ___ written  _X__ verbal and written  ___ telephone  ___ other:________ consent.      Unable to obtain consent; procedure performed on emergent basis.     Other:    Benefits, limitations and potential complications of the procedure were discussed with the patient/family  including, but not limited to bleeding, hemoptysis, respiratory failure requiring intubation and/or prolongued mechanical ventilation, infection, pneumothorax (collapse of lung) requiring chest tube placement, stroke or even death.  Patient and husband understood and agreed to proceed.   Type of anesthesia: General endotracheal  Surgeon: Renold Don, MD Assistant/Scrub: Liborio Nixon, RRT Anesthesiologist/CRNA: Alvin Critchley, MD/Tawana Fletcher-Harris, CRNA Cytotechnology: [facility]   PROCEDURE DETAILS: The patient was brought to Procedure Room 2 in the OR area (Bronchoscopy Suite).  Timeout was performed and correct patient, ID, procedure, laterality confirmed.  Patient was inducted under general anesthesia by the anesthesiology team.  She was intubated with a #8 ET tube without difficulty.  Next adapter was placed over the ET tube.  The Olympus video therapeutic bronchoscope was then advanced through the existing ET tube via the Portex adapter.  The visible trachea was normal.  Carina appeared somewhat  full.  Anatomic tour of the lung was performed.  Left tracheobronchial tree was normal without lesions or mucosal abnormalities.  Bronchoscope was then brought to the RIGHT.  The secondary carina into the right upper lobe appeared thickened and distorted.  No endobronchial lesions were noted on the right upper lobe bronchi.  Advancing the bronchoscope to the right middle lobe and right lower lobe, it was evident that the right middle lobe was narrowed.  The right lower lobe had evidence of endobronchial mass that appeared to arise submucosally.  The mass appeared to be quite vascular.  At this point 3 mL of 1-10,000 epinephrine solution was instilled to blanch the mass.  Bronchial brushings were done x2.  ROSE was performed on the first pass of brushings.  This showed lesional cells.  Once brushings were completed, biopsies were performed x6.  Lesional cells were noted on the first biopsy specimen.  During biopsies mild purulent secretions were noted indicating postobstructive process.  Once biopsies were completed, bronchoalveolar lavage was performed 40 mL of saline were instilled yielding approximately 8 mL of aliquot.  This was placed in CytoLyt for further analysis.  At this point the bronchoscope was then removed and replaced by an Olympus endobronchial ultrasound scope.  The mediastinum was examined there was evidence of precarinal adenopathy.  In the subcarinal area lymph nodes were not distinct and the appearance was that of a large mass.  The subcarinal space was approached from the right.  A total of 4 19-gauge Olympus EBUS needle TBNA passes were performed.  ROSE revealed lesional cells on first TBNA pass.  Once this was completed, the EBUS scope was then replaced again by the Olympus therapeutic scope,  examination of the airway showed excellent hemostasis.  The patient then received 9 mL of lidocaine via bronchial lavage and the bronchoscope was retrieved procedure was then terminated.  The patient was  allowed to emerge from general anesthesia and she was extubated in the procedure room without difficulty.  She was transferred to PACU in satisfactory condition.  No overt complications were evident.  She tolerated the procedure well.   SPECIMENS (Sites): (Place X beside choice below)  Specimens Description   No Specimens Obtained     Washings   X Lavage RLL, 8 ml  X Biopsies RLL, X 6  X TBNA Subcarinal X 4  X Brushings RLL, X 2   Sputum    FINDINGS:  See details of the procedure above and photos below.   Main carina    Thickened secondary carina on the right upper lobe   Narrowed right middle lobe, narrowed right lower lobe with endobronchial mass visible     Detailed look of the right lower lobe mass   Post biopsies, good hemostasis   ESTIMATED BLOOD LOSS: Less than 2 mL COMPLICATIONS/RESOLUTION: Apparent complications   IMPRESSION:POST-PROCEDURE DX:  Right lower lobe mass, consistent with malignancy Mediastinal mass, consistent with malignancy Purulent secretions consistent with postobstructive process Impending complete obstruction of right lung  RECOMMENDATION/PLAN:  Augmentin 875 mg twice daily x 10 days Await pathology reports Follow-up with oncology and pulmonary medicine Will need radiation therapy ASAP   C. Derrill Kay, MD [place] [facility]   *This note was dictated using voice recognition software/Dragon.  Despite best efforts to proofread, errors can occur which can change the meaning.  Any change was purely unintentional.

## 2021-04-25 NOTE — Transfer of Care (Signed)
Immediate Anesthesia Transfer of Care Note  Patient: Cathy Glover  Procedure(s) Performed: VIDEO BRONCHOSCOPY WITH ENDOBRONCHIAL ULTRASOUND  Patient Location: PACU  Anesthesia Type:General  Level of Consciousness: awake, drowsy and patient cooperative  Airway & Oxygen Therapy: Patient Spontanous Breathing and Patient connected to face mask oxygen  Post-op Assessment: Report given to RN and Post -op Vital signs reviewed and stable  Post vital signs: Reviewed and stable  Last Vitals:  Vitals Value Taken Time  BP 88/59 04/25/21 1345  Temp 36.6 C 04/25/21 1342  Pulse 108 04/25/21 1350  Resp 17 04/25/21 1350  SpO2 94 % 04/25/21 1350  Vitals shown include unvalidated device data.  Last Pain:  Vitals:   04/25/21 1345  TempSrc:   PainSc: 0-No pain         Complications: No notable events documented.

## 2021-04-25 NOTE — Anesthesia Preprocedure Evaluation (Signed)
Anesthesia Evaluation  Patient identified by MRN, date of birth, ID band Patient awake    Reviewed: Allergy & Precautions, NPO status , Patient's Chart, lab work & pertinent test results  Airway Mallampati: II  TM Distance: <3 FB     Dental  (+) Upper Dentures, Lower Dentures   Pulmonary COPD,  COPD inhaler, former smoker,    Pulmonary exam normal        Cardiovascular negative cardio ROS Normal cardiovascular exam     Neuro/Psych negative neurological ROS  negative psych ROS   GI/Hepatic Neg liver ROS,   Endo/Other  negative endocrine ROS  Renal/GU negative Renal ROS  negative genitourinary   Musculoskeletal negative musculoskeletal ROS (+)   Abdominal   Peds negative pediatric ROS (+)  Hematology negative hematology ROS (+)   Anesthesia Other Findings   Reproductive/Obstetrics                             Anesthesia Physical Anesthesia Plan  ASA: 3  Anesthesia Plan: General   Post-op Pain Management:    Induction: Intravenous  PONV Risk Score and Plan:   Airway Management Planned: Oral ETT  Additional Equipment:   Intra-op Plan:   Post-operative Plan: Extubation in OR  Informed Consent: I have reviewed the patients History and Physical, chart, labs and discussed the procedure including the risks, benefits and alternatives for the proposed anesthesia with the patient or authorized representative who has indicated his/her understanding and acceptance.     Dental advisory given  Plan Discussed with: CRNA and Surgeon  Anesthesia Plan Comments:         Anesthesia Quick Evaluation

## 2021-04-25 NOTE — Telephone Encounter (Signed)
I got a call from [place] with the preservice center stating that code [phone] needed a PA done through the patient's insurance. I called again and PA was required. Got a call from [facility] on 04/24/21 stating for code [id number] approved Auth # [id number] valid 04/25/21 to 07/24/21

## 2021-04-25 NOTE — Discharge Instructions (Signed)

## 2021-04-25 NOTE — Interval H&P Note (Signed)
History and Physical Interval Note:  04/25/2021 11:13 AM  Cathy Glover  has presented today for surgery, with the diagnosis of LUNG NODULE AND MEDIASTINAL.  The various methods of treatment have been discussed with the patient and family. After consideration of risks, benefits and other options for treatment, the patient has consented to  Procedure(s): [place] (N/A) as a surgical intervention.  The patient's history has been reviewed, patient examined, no change in status, stable for surgery.  I have reviewed the patient's chart and labs.  Questions were answered to the patient's satisfaction.     Renold Don, MD [facility]

## 2021-04-25 NOTE — Anesthesia Procedure Notes (Signed)
Procedure Name: Awake intubation Date/Time: 04/25/2021 12:58 PM Performed by: Kelton Pillar, CRNA Pre-anesthesia Checklist: Patient identified, Emergency Drugs available, Suction available and Patient being monitored Patient Re-evaluated:Patient Re-evaluated prior to induction Oxygen Delivery Method: Circle system utilized Preoxygenation: Pre-oxygenation with 100% oxygen Induction Type: IV induction Ventilation: Mask ventilation without difficulty Laryngoscope Size: McGraph and 3 Grade View: Grade I Tube type: Oral Tube size: 8.0 mm Number of attempts: 1 Airway Equipment and Method: Stylet Placement Confirmation: ETT inserted through vocal cords under direct vision, positive ETCO2, CO2 detector and breath sounds checked- equal and bilateral Secured at: 21 cm Tube secured with: Tape Dental Injury: Teeth and Oropharynx as per pre-operative assessment

## 2021-04-26 ENCOUNTER — Encounter: Payer: Self-pay | Admitting: Pulmonary Disease

## 2021-04-27 ENCOUNTER — Other Ambulatory Visit: Payer: Self-pay | Admitting: Pathology

## 2021-04-27 LAB — CYTOLOGY - NON PAP

## 2021-04-27 LAB — SURGICAL PATHOLOGY

## 2021-04-29 NOTE — Anesthesia Postprocedure Evaluation (Signed)
Anesthesia Post Note  Patient: Cathy Glover  Procedure(s) Performed: VIDEO BRONCHOSCOPY WITH ENDOBRONCHIAL ULTRASOUND  Patient location during evaluation: PACU Anesthesia Type: General Level of consciousness: awake and alert and oriented Pain management: pain level controlled Vital Signs Assessment: post-procedure vital signs reviewed and stable Respiratory status: spontaneous breathing Cardiovascular status: blood pressure returned to baseline Anesthetic complications: no   No notable events documented.   Last Vitals:  Vitals:   04/25/21 1430 04/25/21 1452  BP: 104/63 127/84  Pulse: (!) 107 (!) 108  Resp: (!) 23 18  Temp: 36.6 C   SpO2: 95% 92%    Last Pain:  Vitals:   04/25/21 1430  TempSrc:   PainSc: 0-No pain                 Clydia Nieves

## 2021-05-01 ENCOUNTER — Encounter: Payer: Self-pay | Admitting: Internal Medicine

## 2021-05-01 ENCOUNTER — Ambulatory Visit
Admission: RE | Admit: 2021-05-01 | Discharge: 2021-05-01 | Disposition: A | Discharge status: Home / Self Care | Payer: 59 | Source: Ambulatory Visit | Attending: Radiation Oncology | Admitting: Radiation Oncology

## 2021-05-01 ENCOUNTER — Telehealth: Payer: Self-pay | Admitting: *Deleted

## 2021-05-01 ENCOUNTER — Other Ambulatory Visit: Payer: Self-pay

## 2021-05-01 ENCOUNTER — Encounter: Payer: Self-pay | Admitting: Radiation Oncology

## 2021-05-01 ENCOUNTER — Inpatient Hospital Stay: Payer: 59

## 2021-05-01 ENCOUNTER — Encounter: Payer: Self-pay | Admitting: *Deleted

## 2021-05-01 ENCOUNTER — Inpatient Hospital Stay: Payer: 59 | Attending: Internal Medicine | Admitting: Internal Medicine

## 2021-05-01 VITALS — BP 135/80 | HR 97 | Temp 97.4°F | Resp 20 | Ht 59.0 in | Wt 108.0 lb

## 2021-05-01 VITALS — BP 135/80 | HR 97 | Temp 97.4°F | Resp 20 | Wt 108.6 lb

## 2021-05-01 DIAGNOSIS — C3490 Malignant neoplasm of unspecified part of unspecified bronchus or lung: Secondary | ICD-10-CM

## 2021-05-01 DIAGNOSIS — Z79899 Other long term (current) drug therapy: Secondary | ICD-10-CM | POA: Diagnosis not present

## 2021-05-01 DIAGNOSIS — Z809 Family history of malignant neoplasm, unspecified: Secondary | ICD-10-CM | POA: Diagnosis not present

## 2021-05-01 DIAGNOSIS — R531 Weakness: Secondary | ICD-10-CM | POA: Diagnosis not present

## 2021-05-01 DIAGNOSIS — C3431 Malignant neoplasm of lower lobe, right bronchus or lung: Secondary | ICD-10-CM | POA: Diagnosis not present

## 2021-05-01 DIAGNOSIS — J449 Chronic obstructive pulmonary disease, unspecified: Secondary | ICD-10-CM | POA: Diagnosis not present

## 2021-05-01 DIAGNOSIS — Z87891 Personal history of nicotine dependence: Secondary | ICD-10-CM | POA: Diagnosis not present

## 2021-05-01 DIAGNOSIS — F17210 Nicotine dependence, cigarettes, uncomplicated: Secondary | ICD-10-CM | POA: Diagnosis not present

## 2021-05-01 DIAGNOSIS — R918 Other nonspecific abnormal finding of lung field: Secondary | ICD-10-CM

## 2021-05-01 DIAGNOSIS — Z95828 Presence of other vascular implants and grafts: Secondary | ICD-10-CM

## 2021-05-01 LAB — CBC WITH DIFFERENTIAL/PLATELET
Abs Immature Granulocytes: 0.02 10*3/uL (ref 0.00–0.07)
Basophils Absolute: 0 10*3/uL (ref 0.0–0.1)
Basophils Relative: 0 %
Eosinophils Absolute: 0.2 10*3/uL (ref 0.0–0.5)
Eosinophils Relative: 4 %
HCT: 37.2 % (ref 36.0–46.0)
Hemoglobin: 12.8 g/dL (ref 12.0–15.0)
Immature Granulocytes: 0 %
Lymphocytes Relative: 18 %
Lymphs Abs: 1 10*3/uL (ref 0.7–4.0)
MCH: 31.9 pg (ref 26.0–34.0)
MCHC: 34.4 g/dL (ref 30.0–36.0)
MCV: 92.8 fL (ref 80.0–100.0)
Monocytes Absolute: 0.3 10*3/uL (ref 0.1–1.0)
Monocytes Relative: 6 %
Neutro Abs: 3.8 10*3/uL (ref 1.7–7.7)
Neutrophils Relative %: 72 %
Platelets: 405 10*3/uL — ABNORMAL HIGH (ref 150–400)
RBC: 4.01 MIL/uL (ref 3.87–5.11)
RDW: 12.4 % (ref 11.5–15.5)
WBC: 5.3 10*3/uL (ref 4.0–10.5)
nRBC: 0 % (ref 0.0–0.2)

## 2021-05-01 LAB — COMPREHENSIVE METABOLIC PANEL
ALT: 29 U/L (ref 0–44)
AST: 28 U/L (ref 15–41)
Albumin: 3.1 g/dL — ABNORMAL LOW (ref 3.5–5.0)
Alkaline Phosphatase: 117 U/L (ref 38–126)
Anion gap: 10 (ref 5–15)
BUN: 9 mg/dL (ref 8–23)
CO2: 27 mmol/L (ref 22–32)
Calcium: 8.9 mg/dL (ref 8.9–10.3)
Chloride: 95 mmol/L — ABNORMAL LOW (ref 98–111)
Creatinine, Ser: 0.55 mg/dL (ref 0.44–1.00)
GFR, Estimated: >60 mL/min (ref >60)
Glucose, Bld: 128 mg/dL — ABNORMAL HIGH (ref 70–99)
Potassium: 3.5 mmol/L (ref 3.5–5.1)
Sodium: 132 mmol/L — ABNORMAL LOW (ref 135–145)
Total Bilirubin: 0.7 mg/dL (ref 0.3–1.2)
Total Protein: 8.2 g/dL — ABNORMAL HIGH (ref 6.5–8.1)

## 2021-05-01 LAB — LACTATE DEHYDROGENASE: LDH: 245 U/L — ABNORMAL HIGH (ref 98–192)

## 2021-05-01 MED ORDER — ALBUTEROL SULFATE HFA 108 (90 BASE) MCG/ACT IN AERS: 1.0000 | INHALATION_SPRAY | Freq: Four times a day (QID) | RESPIRATORY_TRACT | 0 refills | Status: DC | PRN

## 2021-05-01 MED ORDER — PROCHLORPERAZINE MALEATE 10 MG PO TABS: 10.0000 mg | ORAL_TABLET | Freq: Four times a day (QID) | ORAL | 0 refills | Status: DC | PRN

## 2021-05-01 MED ORDER — PREDNISONE 20 MG PO TABS: ORAL_TABLET | ORAL | 0 refills | Status: DC

## 2021-05-01 MED ORDER — ONDANSETRON HCL 8 MG PO TABS: 8.0000 mg | ORAL_TABLET | Freq: Three times a day (TID) | ORAL | 0 refills | Status: DC | PRN

## 2021-05-01 MED ORDER — LIDOCAINE-PRILOCAINE 2.5-2.5 % EX CREA: 1.0000 "application " | TOPICAL_CREAM | CUTANEOUS | 0 refills | Status: DC | PRN

## 2021-05-01 NOTE — Telephone Encounter (Signed)
Port placement arranged for Fri 05/11/21 @ 8:30a  Arrive @7 :30a   Attempted to reach patient 05/01/21 at 1425- no answer. Unable to leave vm

## 2021-05-01 NOTE — Progress Notes (Signed)
[place] NOTE  Patient Care Team: Pcp, No as PCP - General Telford Nab, RN as Oncology Nurse Navigator  CHIEF COMPLAINTS/PURPOSE OF CONSULTATION: lung nodule/mass  #  Oncology History Overview Note  # June 2022-  A. LUNG, RIGHT LOWER LOBE; BIOPSY: [Dr.Gonzalez] SMALL CELL CARCINOMA.    Primary cancer of right lower lobe of lung ([place])  05/01/2021 Initial Diagnosis   Primary cancer of right lower lobe of lung ([place])   05/01/2021 Cancer Staging   Staging form: Lung, AJCC 8th Edition - Clinical: Stage IIIB (cT3, cN2, cM0) - Signed by Cammie Sickle, MD on 05/01/2021  Stage prefix: Initial diagnosis       HISTORY OF PRESENTING ILLNESS-   Cathy Glover 65 y.o.  female history of smoking is here for further evaluation and recommendations for newly diagnosed small cell lung cancer.  Patient noted to have worsening cough for the last few months.  Patient was initially treated with antibiotics without any significant provement.  Patient subsequently underwent bronchoscopy/biopsy right lower lobe lung nodule positive for small cell lung cancer.  Patient is accompanied by her husband to discuss the treatment option.  Patient was evaluated by radiation oncology this morning.  Patient has continued shortness of breath continued cough.  She also has wheezing.  She is running out of albuterol inhaler.  Review of Systems  Constitutional:  Positive for malaise/fatigue and weight loss. Negative for chills, diaphoresis and fever.  HENT:  Negative for nosebleeds and sore throat.   Eyes:  Negative for double vision.  Respiratory:  Positive for cough, shortness of breath and wheezing. Negative for hemoptysis and sputum production.   Cardiovascular:  Negative for chest pain, palpitations, orthopnea and leg swelling.  Gastrointestinal:  Negative for abdominal pain, blood in stool, constipation, diarrhea, heartburn, melena, nausea and vomiting.  Genitourinary:  Negative  for dysuria, frequency and urgency.  Musculoskeletal:  Negative for back pain and joint pain.  Skin: Negative.  Negative for itching and rash.  Neurological:  Negative for dizziness, tingling, focal weakness, weakness and headaches.  Endo/Heme/Allergies:  Does not bruise/bleed easily.  Psychiatric/Behavioral:  Negative for depression. The patient is not nervous/anxious and does not have insomnia.     MEDICAL HISTORY:  Past Medical History:  Diagnosis Date   Pulmonary nodule     SURGICAL HISTORY: Past Surgical History:  Procedure Laterality Date   VIDEO BRONCHOSCOPY WITH ENDOBRONCHIAL ULTRASOUND N/A 04/25/2021   Procedure: VIDEO BRONCHOSCOPY WITH ENDOBRONCHIAL ULTRASOUND;  Surgeon: Tyler Pita, MD;  Location: [facility] ORS;  Service: Cardiopulmonary;  Laterality: N/A;    SOCIAL HISTORY: Social History   Socioeconomic History   Marital status: Married    Spouse name: Not on file   Number of children: Not on file   Years of education: Not on file   Highest education level: Not on file  Occupational History   Not on file  Tobacco Use   Smoking status: Former    Packs/day: 0.50    Years: 50.00    Pack years: 25.00    Types: Cigarettes    Quit date: 04/11/2021    Years since quitting: 0.0   Smokeless tobacco: Never  Vaping Use   Vaping Use: Never used  Substance and Sexual Activity   Alcohol use: Never   Drug use: Never   Sexual activity: Not on file  Other Topics Concern   Not on file  Social History Narrative   Lives in [place] with husband; 2 grown sons; quit smoking- June  2022. No alcohol.    Social Determinants of Health   Financial Resource Strain: Not on file  Food Insecurity: Not on file  Transportation Needs: Not on file  Physical Activity: Not on file  Stress: Not on file  Social Connections: Not on file  Intimate Partner Violence: Not on file    FAMILY HISTORY: Family History  Problem Relation Age of Onset   Cancer Sister     ALLERGIES:  has No  Known Allergies.  MEDICATIONS:  Current Outpatient Medications  Medication Sig Dispense Refill   amoxicillin-clavulanate (AUGMENTIN) 875-125 MG tablet Take 1 tablet by mouth 2 (two) times daily for 10 days. 20 tablet 0   dextromethorphan-guaiFENesin (MUCINEX DM) 30-600 MG 12hr tablet Take 1 tablet by mouth 2 (two) times daily.     predniSONE (DELTASONE) 20 MG tablet Take  3 pills once a day x 4 days; and then 2 pills once a day x 4 days; and then one pill a day. Once a day with food in AM> 30 tablet 0   albuterol (VENTOLIN HFA) 108 (90 Base) MCG/ACT inhaler Inhale 1-2 puffs into the lungs every 6 (six) hours as needed for wheezing or shortness of breath. 8 g 0   lidocaine-prilocaine (EMLA) cream Apply 1 application topically as needed. 30 g 0   ondansetron (ZOFRAN) 8 MG tablet Take 1 tablet (8 mg total) by mouth every 8 (eight) hours as needed for nausea or vomiting. 20 tablet 0   prochlorperazine (COMPAZINE) 10 MG tablet Take 1 tablet (10 mg total) by mouth every 6 (six) hours as needed for nausea or vomiting. 30 tablet 0   No current facility-administered medications for this visit.      Marland Kitchen  PHYSICAL EXAMINATION: ECOG PERFORMANCE STATUS: 1 - Symptomatic but completely ambulatory  Vitals:   05/01/21 1023  BP: 135/80  Pulse: 97  Resp: 20  Temp: (!) 97.4 F (36.3 C)   Filed Weights   05/01/21 1023  Weight: 108 lb (49 kg)    Physical Exam Vitals and nursing note reviewed.  Constitutional:      Comments: Ambulating: Independently.   Accompanied: with husband.   HENT:     Head: Normocephalic and atraumatic.     Mouth/Throat:     Pharynx: Oropharynx is clear.  Eyes:     Extraocular Movements: Extraocular movements intact.     Pupils: Pupils are equal, round, and reactive to light.  Cardiovascular:     Rate and Rhythm: Normal rate and regular rhythm.  Pulmonary:     Comments: Decreased breath sounds bilaterally.  Abdominal:     Palpations: Abdomen is soft.   Musculoskeletal:        General: Normal range of motion.     Cervical back: Normal range of motion.  Skin:    General: Skin is warm.  Neurological:     General: No focal deficit present.     Mental Status: She is alert and oriented to person, place, and time.  Psychiatric:        Behavior: Behavior normal.        Judgment: Judgment normal.    LABORATORY DATA:  I have reviewed the data as listed Lab Results  Component Value Date   WBC 5.3 05/01/2021   HGB 12.8 05/01/2021   HCT 37.2 05/01/2021   MCV 92.8 05/01/2021   PLT 405 (H) 05/01/2021   Recent Labs    05/01/21 1147  NA 132*  K 3.5  CL 95*  CO2 27  GLUCOSE 128*  BUN 9  CREATININE 0.55  CALCIUM 8.9  GFRNONAA >60  PROT 8.2*  ALBUMIN 3.1*  AST 28  ALT 29  ALKPHOS 117  BILITOT 0.7    RADIOGRAPHIC STUDIES: I have personally reviewed the radiological images as listed and agreed with the findings in the report. DG Chest 2 View  Result Date: 04/12/2021 CLINICAL DATA:  Chronic cough. EXAM: CHEST - 2 VIEW COMPARISON:  Chest x-ray dated Mar 26, 2021. FINDINGS: The heart size and mediastinal contours are within normal limits. Normal pulmonary vascularity. Persistent and mildly worsened consolidation in the right middle lobe. No pleural effusion or pneumothorax. No acute osseous abnormality. IMPRESSION: 1. Mildly worsened consolidation in the right middle lobe. Given persistence, further evaluation with chest CT is recommended. Electronically Signed   By: Titus Dubin M.D.   On: 04/12/2021 15:44   CT Chest Wo Contrast  Result Date: 04/12/2021 CLINICAL DATA:  Respiratory illness. Right middle lobe consolidation. EXAM: CT CHEST WITHOUT CONTRAST TECHNIQUE: Multidetector CT imaging of the chest was performed following the standard protocol without IV contrast. COMPARISON:  None. FINDINGS: Cardiovascular: The heart size is normal. Heart size is within normal limits. Small pericardial effusion noted. Coronary artery calcification  is evident. Atherosclerotic calcification is noted in the wall of the thoracic aorta. Mediastinum/Nodes: Mediastinal lymphadenopathy evident including 17 mm right paratracheal node on 47/2. Bulky 2.8 cm short axis subcarinal lymph node evident. Right hilar lymphadenopathy noted. There may be some lymphadenopathy in left hilum is well. There is no axillary lymphadenopathy. Lungs/Pleura: Centrilobular emphsyema noted. Multifocal ill-defined nodular consolidative disease is identified in the central right upper lobe, central right middle lobe, and left lower lobe. There is associated volume loss and consolidative opacity in the right middle and lower lobes. Index right lower lobe nodular component measures 2.0 x 1.6 cm on image 77/3. No pleural effusion. Upper Abdomen: 19 mm low-density lesion upper pole right kidney measures water density, likely a cyst. 2.1 cm left adrenal nodule has attenuation too high to be characterized as an adenoma. Musculoskeletal: No worrisome lytic or sclerotic osseous abnormality. IMPRESSION: 1. Multifocal ill-defined nodular consolidative disease in the central right upper lobe, central right middle lobe, and left lower lobe. Index right lower lobe nodular component measures up to 2.0 cm. Findings are associated with bulky mediastinal and right hilar lymphadenopathy. Given persistence, multifocal neoplasm (adenocarcinoma) is a concern. Infectious etiology not excluded. Pulmonary consultation recommended and close follow-up warranted. 2. 2.1 cm left adrenal nodule has attenuation too high to be characterized as an adenoma. Close follow-up recommended. 3. Aortic Atherosclerosis (ICD10-I70.0) and Emphysema (ICD10-J43.9). Electronically Signed   By: Misty Stanley M.D.   On: 04/12/2021 17:29    ASSESSMENT & PLAN:   Primary cancer of right lower lobe of lung ([place]) #Stage III/limited stage small cell lung cancer based on current imaging.  Recommend a PET scan for further evaluation.  Also  recommend MRI given the concerns for asymptomatic brain mets.  #Discussed in general overall guarded prognosis of small cell lung cancer/even with limited stage ~5 years survival at 20%.  #Recommend aggressive chemoradiation therapy. #Discussed chemo-Radiation therapy for limited stage small cell lung cancer includes carbo etoposide every 3 weeks x 4 cycles.I reviewed the chemotherapy schedule in detail; patient will get radiation Monday to Friday.   # Discuss the option of clinical trial with atzeo with stand chemo -RT.    Discussed the potential side effects including but not limited to-increasing fatigue, nausea vomiting, diarrhea, hair loss, sores  in the mouth, increase risk of infection and also neuropathy.  Also discussed the potential side effects of radiation induced esophagitis.  # Chemotherapy education; port placement. Antiemetics-Zofran and Compazine; EMLA cream sent to pharmacy.   #Smoking: Congratulated patient quitting smoking.   #COPD: Poorly controlled-recommend prednisone taper; also recommend albuterol inhaler.-Prescribed.  Thank you Dr.Gonzalez for allowing me to participate in the care of your pleasant patient. Please do not hesitate to contact me with questions or concerns in the interim.  Discussed with [facility].  # DISPOSITION # labs today-CBC/CMP/LDH today- please order # MRI Brain ASAP- please order # chemo education # IR port referral ASAP # follow up in 07/05-[[place]]; MDl labs- cbc/cmp; carbo-etop; d-2 &3- etop- Dr.B  All questions were answered. The patient knows to call the clinic with any problems, questions or concerns.    Cammie Sickle, MD 05/01/2021 10:52 PM

## 2021-05-01 NOTE — Assessment & Plan Note (Addendum)
#  Stage III/limited stage small cell lung cancer based on current imaging.  Recommend a PET scan for further evaluation.  Also recommend MRI given the concerns for asymptomatic brain mets.  #Discussed in general overall guarded prognosis of small cell lung cancer/even with limited stage ~5 years survival at 20%.  #Recommend aggressive chemoradiation therapy. #Discussed chemo-Radiation therapy for limited stage small cell lung cancer includes carbo etoposide every 3 weeks x 4 cycles.I reviewed the chemotherapy schedule in detail; patient will get radiation Monday to Friday.   # Discuss the option of clinical trial with atzeo with stand chemo -RT.    Discussed the potential side effects including but not limited to-increasing fatigue, nausea vomiting, diarrhea, hair loss, sores in the mouth, increase risk of infection and also neuropathy.  Also discussed the potential side effects of radiation induced esophagitis.  # Chemotherapy education; port placement. Antiemetics-Zofran and Compazine; EMLA cream sent to pharmacy.   #Smoking: Congratulated patient quitting smoking.   #COPD: Poorly controlled-recommend prednisone taper; also recommend albuterol inhaler.-Prescribed.  Thank you Dr.Gonzalez for allowing me to participate in the care of your pleasant patient. Please do not hesitate to contact me with questions or concerns in the interim.  Discussed with [facility].  # DISPOSITION # labs today-CBC/CMP/LDH today- please order # MRI Brain ASAP- please order # chemo education # IR port referral ASAP # follow up in 07/05-[[place]]; MDl labs- cbc/cmp; carbo-etop; d-2 &3- etop- Dr.B

## 2021-05-01 NOTE — Progress Notes (Addendum)
CLINICAL TRIAL SELECTED - Small Cell Lung  Trial: [id number] [id number] - LIMITED STAGE SMALL CELL LUNG CANCER (LS-SCLC): A PHASE III RANDOMIZED STUDY OF CHEMORADIATION VERSUS CHEMORADIATION PLUS ATEZOLIZUMAB  **Trial eligibility and accrual should be confirmed by your research team**  Patient Characteristics: Newly Diagnosed, Preoperative or Nonsurgical Candidate (Clinical Staging), First Line, Limited Stage, Nonsurgical Candidate Therapeutic Status: Newly Diagnosed, Preoperative or Nonsurgical Candidate (Clinical Staging) AJCC T Category: cT3 AJCC N Category: cN2 AJCC M Category: cM0 AJCC 8 Stage Grouping: IIIB Stage Classification: Limited Surgical Candidacy: Nonsurgical Candidate Intent of Therapy: Curative Intent, Discussed with Patient

## 2021-05-01 NOTE — Consult Note (Signed)
NEW PATIENT EVALUATION  Name: Cathy Glover  MRN: [id number]  Date:   05/01/2021     DOB: [birth year]   This 65 y.o. 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  REFERRING PHYSICIAN: No ref. provider found  CHIEF COMPLAINT:  Chief Complaint  Patient presents with   Lung Cancer    DIAGNOSIS: The encounter diagnosis was Lung mass.   PREVIOUS INVESTIGATIONS:  CT scan reviewed PET CT scan ordered Pathology report reviewed Clinical notes reviewed  HPI: Patient is a 65 year old female presented with a persistent cough over the past 2 years.  She has a 25-pack-year smoking history.  She had a chest x-ray at urgent care showing consolidative process in the right lower lobe for which was empirically treated with antibiotics as well as Tessalon Perles.  CT scan was performed showing multifocal ill-defined nodular consolidative disease in the central right upper lobe central right middle lobe and left lower lobe.  She also had bulky mediastinal and right hilar adenopathy.  She also has a 2.1 cm left adrenal nodule which was not characterized.  PET CT scan has been ordered.  She underwent bronchoscopy by Dr. Patsey Berthold with pathology positive for small cell lung cancer.  She is seen today by both medical and radiation oncology for opinion.  She is somewhat weak still has persistent cough no hemoptysis.  PLANNED TREATMENT REGIMEN: Depending on staging possible concurrent chemoradiation  PAST MEDICAL HISTORY:  has a past medical history of Pulmonary nodule.    PAST SURGICAL HISTORY:  Past Surgical History:  Procedure Laterality Date   VIDEO BRONCHOSCOPY WITH ENDOBRONCHIAL ULTRASOUND N/A 04/25/2021   Procedure: VIDEO BRONCHOSCOPY WITH ENDOBRONCHIAL ULTRASOUND;  Surgeon: Tyler Pita, MD;  Location: [facility] ORS;  Service: Cardiopulmonary;  Laterality: N/A;    FAMILY HISTORY: family history includes Cancer in her  sister.  SOCIAL HISTORY:  reports that she quit smoking about 2 weeks ago. Her smoking use included cigarettes. She has a 25.00 pack-year smoking history. She has never used smokeless tobacco. She reports that she does not drink alcohol and does not use drugs.  ALLERGIES: Patient has no known allergies.  MEDICATIONS:  Current Outpatient Medications  Medication Sig Dispense Refill   amoxicillin-clavulanate (AUGMENTIN) 875-125 MG tablet Take 1 tablet by mouth 2 (two) times daily for 10 days. 20 tablet 0   dextromethorphan-guaiFENesin (MUCINEX DM) 30-600 MG 12hr tablet Take 1 tablet by mouth 2 (two) times daily.     albuterol (VENTOLIN HFA) 108 (90 Base) MCG/ACT inhaler Inhale 1-2 puffs into the lungs every 6 (six) hours as needed for wheezing or shortness of breath. 8 g 0   predniSONE (DELTASONE) 20 MG tablet Take  3 pills once a day x 4 days; and then 2 pills once a day x 4 days; and then one pill a day. Once a day with food in AM> 30 tablet 0   No current facility-administered medications for this encounter.    ECOG PERFORMANCE STATUS:  1 - Symptomatic but completely ambulatory  REVIEW OF SYSTEMS: Patient denies any weight loss, fatigue, weakness, fever, chills or night sweats. Patient denies any loss of vision, blurred vision. Patient denies any ringing  of the ears or hearing loss. No irregular heartbeat. Patient denies heart murmur or history of fainting. Patient denies any chest pain or pain radiating to her upper extremities. Patient denies any shortness of breath, difficulty breathing at night, cough or hemoptysis. Patient denies any swelling in  the lower legs. Patient denies any nausea vomiting, vomiting of blood, or coffee ground material in the vomitus. Patient denies any stomach pain. Patient states has had normal bowel movements no significant constipation or diarrhea. Patient denies any dysuria, hematuria or significant nocturia. Patient denies any problems walking, swelling in the  joints or loss of balance. Patient denies any skin changes, loss of hair or loss of weight. Patient denies any excessive worrying or anxiety or significant depression. Patient denies any problems with insomnia. Patient denies excessive thirst, polyuria, polydipsia. Patient denies any swollen glands, patient denies easy bruising or easy bleeding. Patient denies any recent infections, allergies or URI. Patient "s visual fields have not changed significantly in recent time.   PHYSICAL EXAM: BP 135/80   Pulse 97   Temp (!) 97.4 F (36.3 C) (Tympanic)   Resp 20   Wt 108 lb 9.6 oz (49.3 kg)   SpO2 98%   BMI 21.93 kg/m  Wheelchair-bound female slightly frail in NAD.  Well-developed well-nourished patient in NAD. HEENT reveals PERLA, EOMI, discs not visualized.  Oral cavity is clear. No oral mucosal lesions are identified. Neck is clear without evidence of cervical or supraclavicular adenopathy. Lungs are clear to A&P. Cardiac examination is essentially unremarkable with regular rate and rhythm without murmur rub or thrill. Abdomen is benign with no organomegaly or masses noted. Motor sensory and DTR levels are equal and symmetric in the upper and lower extremities. Cranial nerves II through XII are grossly intact. Proprioception is intact. No peripheral adenopathy or edema is identified. No motor or sensory levels are noted. Crude visual fields are within normal range.  LABORATORY DATA: Pathology report reviewed    RADIOLOGY RESULTS: CT scans reviewed PET CT scan for this week.  Patient will also need MRI of brain for complete staging.   IMPRESSION: Small cell lung cancer as yet not completely staged  PLAN: Present time patient needs complete work-up for her small cell lung cancer.  This will include PET CT scan as well as MRI scan of her brain.  Should there be disease confined to her chest would recommend concurrent chemoradiation.  Other uses of radiation for palliation also may be entertained.  I  have tentatively set her up for simulation next week once we have reviewed her complete work-up for staging.  Risks and benefits of treatment including possible radiation esophagitis skin reaction fatigue alteration of blood counts cough all were discussed in detail with the patient and her husband both seem to comprehend my treatment plan well.  Again we will make further recommendations after completion of staging.  I would like to take this opportunity to thank you for allowing me to participate in the care of your patient.Noreene Filbert, MD

## 2021-05-03 ENCOUNTER — Other Ambulatory Visit: Payer: Self-pay

## 2021-05-03 ENCOUNTER — Ambulatory Visit
Admission: RE | Admit: 2021-05-03 | Discharge: 2021-05-03 | Disposition: A | Discharge status: Home / Self Care | Payer: 59 | Source: Ambulatory Visit | Attending: Internal Medicine | Admitting: Internal Medicine

## 2021-05-03 ENCOUNTER — Other Ambulatory Visit: Payer: 59

## 2021-05-03 ENCOUNTER — Telehealth: Payer: Self-pay | Admitting: *Deleted

## 2021-05-03 DIAGNOSIS — I700 Atherosclerosis of aorta: Secondary | ICD-10-CM | POA: Diagnosis not present

## 2021-05-03 DIAGNOSIS — R918 Other nonspecific abnormal finding of lung field: Secondary | ICD-10-CM | POA: Diagnosis present

## 2021-05-03 DIAGNOSIS — R590 Localized enlarged lymph nodes: Secondary | ICD-10-CM | POA: Insufficient documentation

## 2021-05-03 DIAGNOSIS — J984 Other disorders of lung: Secondary | ICD-10-CM | POA: Diagnosis not present

## 2021-05-03 LAB — GLUCOSE, CAPILLARY: Glucose-Capillary: 137 mg/dL — ABNORMAL HIGH (ref 70–99)

## 2021-05-03 MED ORDER — FLUDEOXYGLUCOSE F - 18 (FDG) INJECTION
5.6000 | Freq: Once | INTRAVENOUS | Status: AC | PRN
  Administered 2021-05-03: 6.17 via INTRAVENOUS

## 2021-05-03 NOTE — Telephone Encounter (Signed)
Spoke with pt's husband - confirmed with patient the details for the port a cath placement apt. He saw the apt on mychart and received my message previously

## 2021-05-04 ENCOUNTER — Ambulatory Visit
Admission: RE | Admit: 2021-05-04 | Discharge: 2021-05-04 | Disposition: A | Discharge status: Home / Self Care | Payer: 59 | Source: Ambulatory Visit | Attending: Internal Medicine | Admitting: Internal Medicine

## 2021-05-04 DIAGNOSIS — C3490 Malignant neoplasm of unspecified part of unspecified bronchus or lung: Secondary | ICD-10-CM | POA: Insufficient documentation

## 2021-05-04 MED ORDER — GADOBUTROL 1 MMOL/ML IV SOLN
4.0000 mL | Freq: Once | INTRAVENOUS | Status: AC | PRN
  Administered 2021-05-04: 4 mL via INTRAVENOUS

## 2021-05-04 NOTE — Progress Notes (Signed)
Tumor Board Documentation  Cathy Glover was presented by Dr Rogue Bussing at our Tumor Board on 05/03/2021, which included representatives from radiation oncology, medical oncology, surgical, pharmacy, radiology, genetics, pathology, research, navigation, internal medicine, palliative care, pulmonology.  Cathy Glover currently presents as a new patient, for Cathy Glover, for new positive pathology with history of the following treatments: active survellience, surgical intervention(s).  Additionally, we reviewed previous medical and familial history, history of present illness, and recent lab results along with all available histopathologic and imaging studies. The tumor board considered available treatment options and made the following recommendations: Additional screening, Radiation therapy (primary modality) (MRI Brain)    The following procedures/referrals were also placed: No orders of the defined types were placed in this encounter.   Clinical Trial Status: not discussed   Staging used: To be determined AJCC Staging:       Group: Small Cell Carcinoma Right Lower Lobe of Lung   National site-specific guidelines   were discussed with respect to the case.  Tumor board is a meeting of clinicians from various specialty areas who evaluate and discuss patients for whom a multidisciplinary approach is being considered. Final determinations in the plan of care are those of the provider(s). The responsibility for follow up of recommendations given during tumor board is that of the provider.   Today's extended care, comprehensive team conference, Cathy Glover was not present for the discussion and was not examined.   Multidisciplinary Tumor Board is a multidisciplinary case peer review process.  Decisions discussed in the Multidisciplinary Tumor Board reflect the opinions of the specialists present at the conference without having examined the patient.  Ultimately, treatment and diagnostic decisions rest with  the primary provider(s) and the patient.

## 2021-05-05 ENCOUNTER — Other Ambulatory Visit: Payer: Self-pay | Admitting: Oncology

## 2021-05-05 ENCOUNTER — Telehealth: Payer: Self-pay | Admitting: Internal Medicine

## 2021-05-05 MED ORDER — DEXAMETHASONE 4 MG PO TABS: 8.0000 mg | ORAL_TABLET | Freq: Two times a day (BID) | ORAL | 0 refills | Status: DC

## 2021-05-05 NOTE — Telephone Encounter (Signed)
On 6/24-informed by Dr. 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  Started on steroids.  Dr. Donella Stade agrees to see the patient on 6/27.  H-please make a referral; and schedule on 6/27. C-please call family with further details.  GB

## 2021-05-07 ENCOUNTER — Inpatient Hospital Stay
Admission: RE | Admit: 2021-05-07 | Discharge: 2021-05-07 | Disposition: A | Discharge status: Home / Self Care | Payer: 59 | Source: Ambulatory Visit | Admitting: Radiation Oncology

## 2021-05-07 ENCOUNTER — Encounter: Payer: Self-pay | Admitting: *Deleted

## 2021-05-07 ENCOUNTER — Ambulatory Visit
Admission: RE | Admit: 2021-05-07 | Discharge: 2021-05-07 | Disposition: A | Discharge status: Home / Self Care | Payer: 59 | Source: Ambulatory Visit | Attending: Radiation Oncology | Admitting: Radiation Oncology

## 2021-05-07 DIAGNOSIS — C3431 Malignant neoplasm of lower lobe, right bronchus or lung: Secondary | ICD-10-CM | POA: Diagnosis not present

## 2021-05-07 DIAGNOSIS — Z510 Encounter for antineoplastic radiation therapy: Secondary | ICD-10-CM | POA: Insufficient documentation

## 2021-05-07 DIAGNOSIS — C7931 Secondary malignant neoplasm of brain: Secondary | ICD-10-CM | POA: Diagnosis present

## 2021-05-07 NOTE — Progress Notes (Signed)
Radiation Oncology Follow up Note  Name: Cathy Glover   Date:   05/07/2021 MRN:  [id number] DOB: 1956/08/26    This 65 y.o. female presents to the clinic today for evaluation of brain metastasis and patient with extensive stage small cell lung cancer.  REFERRING PROVIDER: No ref. provider found  HPI: Patient is a 65 year old female originally consulted back 621 for small cell lung cancer..  She was not yet fully staged had an MRI scan of her brain which showed a 3.7 x 3 cm enhancing mass craniocaudal consistent with brain metastasis with regional vasogenic edema.  She has been having no focal neurologic deficits no change in visual fields she states she does have some pressure on her right ear.  At this time evaluating her for whole brain radiation therapy plus boost.  COMPLICATIONS OF TREATMENT: none  FOLLOW UP COMPLIANCE: keeps appointments   PHYSICAL EXAM:  There were no vitals taken for this visit. Crude visual fields are within normal range no focal neurologic deficits are appreciated.  Well-developed well-nourished patient in NAD. HEENT reveals PERLA, EOMI, discs not visualized.  Oral cavity is clear. No oral mucosal lesions are identified. Neck is clear without evidence of cervical or supraclavicular adenopathy. Lungs are clear to A&P. Cardiac examination is essentially unremarkable with regular rate and rhythm without murmur rub or thrill. Abdomen is benign with no organomegaly or masses noted. Motor sensory and DTR levels are equal and symmetric in the upper and lower extremities. Cranial nerves II through XII are grossly intact. Proprioception is intact. No peripheral adenopathy or edema is identified. No motor or sensory levels are noted. Crude visual fields are within normal range.  RADIOLOGY RESULTS: MRI scan reviewed compatible with above-stated findings  PLAN: At this time with like to go ahead with whole brain radiation therapy.  Would plan on delivering 2500 cGy in 10  fractions boosting the large brain metastasis another 20 Gray in 4 fractions.  Risks and benefits of treatment including skin reaction fatigue alteration of blood counts hair loss possible cognitive decline all were discussed in detail with the patient and her husband.  They both comprehend my recommendations well.  We will simulate her today and start her treatments hopefully prior to 4 July weekend.  I would like to take this opportunity to thank you for allowing me to participate in the care of your patient.Noreene Filbert, MD

## 2021-05-07 NOTE — Progress Notes (Signed)
Met with patient and her husband during [place] today. All questions answered during visit. Pt and husband given resources regarding diagnosis and supportive services available. Pt given new print out of appts and review upcoming appts with pt's husband. Contact info given and instructed to call with any further questions or needs. Pt and her husband verbalized understanding.

## 2021-05-08 ENCOUNTER — Telehealth: Payer: Self-pay | Admitting: Internal Medicine

## 2021-05-08 ENCOUNTER — Inpatient Hospital Stay: Payer: 59

## 2021-05-08 NOTE — Progress Notes (Signed)
Patient on schedule for port placement 05/11/2021, called and spoke with patient on phone with instructions given. Made aware to be here @ 0730, NPO after [place] prior to procedure and driver post procedure/recovery/discharge. Stated understanding.

## 2021-05-08 NOTE — Telephone Encounter (Signed)
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 Proceed with dex/ WBRT.  Follow up- keep appt as planned until July 5th/-Chemo*. GB

## 2021-05-09 ENCOUNTER — Ambulatory Visit: Payer: 59

## 2021-05-09 ENCOUNTER — Ambulatory Visit
Admission: RE | Admit: 2021-05-09 | Discharge: 2021-05-09 | Disposition: A | Discharge status: Home / Self Care | Payer: 59 | Source: Ambulatory Visit | Attending: Radiation Oncology | Admitting: Radiation Oncology

## 2021-05-09 ENCOUNTER — Telehealth: Payer: Self-pay | Admitting: *Deleted

## 2021-05-09 DIAGNOSIS — C7931 Secondary malignant neoplasm of brain: Secondary | ICD-10-CM | POA: Diagnosis not present

## 2021-05-09 NOTE — Telephone Encounter (Signed)
Called patient yesterday to inform her that due to insurance authorization we have had to delay the start of her radiation until Tuesday 05/15/21.  She is still to come in on Wednesday 05/09/21 for her dry run and then first tx on 05/15/21.

## 2021-05-10 ENCOUNTER — Ambulatory Visit: Payer: 59

## 2021-05-10 ENCOUNTER — Inpatient Hospital Stay: Payer: 59

## 2021-05-10 ENCOUNTER — Other Ambulatory Visit: Payer: Self-pay | Admitting: Radiology

## 2021-05-10 ENCOUNTER — Other Ambulatory Visit: Payer: Self-pay | Admitting: Internal Medicine

## 2021-05-10 DIAGNOSIS — Z7189 Other specified counseling: Secondary | ICD-10-CM

## 2021-05-10 NOTE — Progress Notes (Signed)
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  Planning whole brain radiation and chemotherapy  Spoke with patient via phone to introduce self and service at [facility].  Patient reports that her appetite is good.  Had saltine crackers this am before taking medication.  Sometimes will eat bologna sandwich for breakfast.  "I have to eat when I take my medicine or I will get sick."  Skips lunch.  Supper husband usually goes and picks something up.  Ate rotiserre chicken last night no other sides.  Sometimes eats ice cream.  Has not tried oral nutrition supplements   Medications: dexamethasone, compazine, zofran, predinsone  Labs: reviewed  Anthropometrics:   Height: 4'11" Weight: 108 lb 9.6 oz on 6/21 UBW: 110-111 lb per patient (111 lb on 6/8 BMI: 21  3% weight loss in ~ month, concerning   Estimated Energy Needs  Kcals: 1200-1500 Protein: 60-75 g Fluid: 1.2  L  NUTRITION DIAGNOSIS: Unintentional weight loss related to cancer as evidenced by 3% weight loss in the last month, concerning   INTERVENTION:  Discussed importance of good nutrition during treatment Discussed importance of adding high calorie, high protein foods in diet. Discussed ways to do that with patient. Encouraged trying oral nutrition supplement 1-2 times per day between meals Contact information given to patient    MONITORING, EVALUATION, GOAL: weight trends, intake   NEXT VISIT: Thursday, July 21 after radiation  Cathy Glover, [place], [place] Registered Dietitian [phone] (mobile)

## 2021-05-10 NOTE — Progress Notes (Signed)
DISCONTINUE SELECTED CLINICAL TRIAL - Small Cell Lung  Trial: [id number] LU005 - LIMITED STAGE SMALL CELL LUNG CANCER (LS-SCLC): A PHASE III RANDOMIZED STUDY OF CHEMORADIATION VERSUS CHEMORADIATION PLUS ATEZOLIZUMAB  **Trial eligibility and accrual should be confirmed by your research team**  REASON: Other Reason PRIOR TREATMENT: Trial: [id number] LU005 - LIMITED STAGE SMALL CELL LUNG CANCER (LS-SCLC): A PHASE III RANDOMIZED STUDY OF CHEMORADIATION VERSUS CHEMORADIATION PLUS ATEZOLIZUMAB TREATMENT RESPONSE: Unable to Evaluate  START ON PATHWAY REGIMEN - Small Cell Lung     Cycles 1 through 4, every 21 days:     Atezolizumab      Carboplatin      Etoposide    Cycles 5 and beyond, every 21 days:     Atezolizumab   **Always confirm dose/schedule in your pharmacy ordering system**  Patient Characteristics: Newly Diagnosed, Preoperative or Nonsurgical Candidate (Clinical Staging), First Line, Extensive Stage Therapeutic Status: Newly Diagnosed, Preoperative or Nonsurgical Candidate (Clinical Staging) AJCC T Category: cTX AJCC N Category: cNX AJCC M Category: cM1c AJCC 8 Stage Grouping: IVB Stage Classification: Extensive  Intent of Therapy: Non-Curative / Palliative Intent, Discussed with Patient

## 2021-05-11 ENCOUNTER — Ambulatory Visit: Payer: 59

## 2021-05-11 ENCOUNTER — Ambulatory Visit
Admission: RE | Admit: 2021-05-11 | Discharge: 2021-05-11 | Disposition: A | Discharge status: Home / Self Care | Payer: 59 | Source: Ambulatory Visit | Attending: Internal Medicine | Admitting: Internal Medicine

## 2021-05-11 ENCOUNTER — Other Ambulatory Visit: Payer: Self-pay

## 2021-05-11 DIAGNOSIS — J449 Chronic obstructive pulmonary disease, unspecified: Secondary | ICD-10-CM | POA: Insufficient documentation

## 2021-05-11 DIAGNOSIS — C3431 Malignant neoplasm of lower lobe, right bronchus or lung: Secondary | ICD-10-CM

## 2021-05-11 DIAGNOSIS — Z87891 Personal history of nicotine dependence: Secondary | ICD-10-CM | POA: Diagnosis not present

## 2021-05-11 DIAGNOSIS — C3491 Malignant neoplasm of unspecified part of right bronchus or lung: Secondary | ICD-10-CM | POA: Insufficient documentation

## 2021-05-11 DIAGNOSIS — C3490 Malignant neoplasm of unspecified part of unspecified bronchus or lung: Secondary | ICD-10-CM

## 2021-05-11 HISTORY — DX: Malignant (primary) neoplasm, unspecified: C80.1

## 2021-05-11 HISTORY — PX: IR IMAGING GUIDED PORT INSERTION: IMG5740

## 2021-05-11 MED ORDER — FENTANYL CITRATE (PF) 100 MCG/2ML IJ SOLN
INTRAMUSCULAR | Status: AC | PRN
  Administered 2021-05-11 (×2): 25 ug via INTRAVENOUS

## 2021-05-11 MED ORDER — SODIUM CHLORIDE 0.9 % IV SOLN: INTRAVENOUS | Status: DC

## 2021-05-11 MED ORDER — MIDAZOLAM HCL 2 MG/2ML IJ SOLN
INTRAMUSCULAR | Status: AC
  Filled 2021-05-11: qty 2

## 2021-05-11 MED ORDER — MIDAZOLAM HCL 2 MG/2ML IJ SOLN
INTRAMUSCULAR | Status: AC | PRN
  Administered 2021-05-11: 0.5 mg via INTRAVENOUS

## 2021-05-11 MED ORDER — HEPARIN SOD (PORK) LOCK FLUSH 100 UNIT/ML IV SOLN
INTRAVENOUS | Status: AC
  Filled 2021-05-11: qty 5

## 2021-05-11 MED ORDER — FENTANYL CITRATE (PF) 100 MCG/2ML IJ SOLN
INTRAMUSCULAR | Status: AC
  Filled 2021-05-11: qty 2

## 2021-05-11 NOTE — Procedures (Signed)
Interventional Radiology Procedure Note  Procedure: Placement of a right IJ approach single lumen PowerPort.  Tip is positioned at the superior cavoatrial junction and catheter is ready for immediate use.  Complications: None Recommendations:  - Ok to shower tomorrow - Do not submerge for 7 days - Routine line care   Signed,  Matthias Bogus S. Kambre Messner, DO   

## 2021-05-11 NOTE — Progress Notes (Signed)
Pharmacist Chemotherapy Monitoring - Initial Assessment    Anticipated start date: 05/15/21   The following has been reviewed per standard work regarding the patient's treatment regimen: The patient's diagnosis, treatment plan and drug doses, and organ/hematologic function Lab orders and baseline tests specific to treatment regimen  The treatment plan start date, drug sequencing, and pre-medications Prior authorization status  Patient's documented medication list, including drug-drug interaction screen and prescriptions for anti-emetics and supportive care specific to the treatment regimen The drug concentrations, fluid compatibility, administration routes, and timing of the medications to be used The patient's access for treatment and lifetime cumulative dose history, if applicable  The patient's medication allergies and previous infusion related reactions, if applicable   Changes made to treatment plan:  N/A  Follow up needed:  N/A   Mieshia Pepitone K, [place], 05/11/2021  11:21 AM 7/5

## 2021-05-11 NOTE — Consult Note (Signed)
Chief Complaint: Patient was seen in consultation today for Port-A-Cath placement  Referring Physician(s): Cammie Sickle  Supervising Physician: Corrie Mckusick  Patient Status: [facility] - Out-pt  History of Present Illness: Cathy Glover is a 65 y.o. female, ex-smoker, with history of COPD and newly diagnosed small cell carcinoma of the right lung who presents today for Port-A-Cath placement prior to planned chemoradiation.  Past Medical History:  Diagnosis Date   Cancer [facility])    Pulmonary nodule     Past Surgical History:  Procedure Laterality Date   VIDEO BRONCHOSCOPY WITH ENDOBRONCHIAL ULTRASOUND N/A 04/25/2021   Procedure: VIDEO BRONCHOSCOPY WITH ENDOBRONCHIAL ULTRASOUND;  Surgeon: Tyler Pita, MD;  Location: [facility] ORS;  Service: Cardiopulmonary;  Laterality: N/A;    Allergies: Patient has no known allergies.  Medications: Prior to Admission medications   Medication Sig Start Date End Date Taking? Authorizing Provider  albuterol (VENTOLIN HFA) 108 (90 Base) MCG/ACT inhaler Inhale 1-2 puffs into the lungs every 6 (six) hours as needed for wheezing or shortness of breath. 05/01/21  Yes Cammie Sickle, MD  dexamethasone (DECADRON) 4 MG tablet Take 2 tablets (8 mg total) by mouth 2 (two) times daily. 05/05/21  Yes Lloyd Huger, MD  dextromethorphan-guaiFENesin [facility] DM) 30-600 MG 12hr tablet Take 1 tablet by mouth 2 (two) times daily.   Yes [provider]  lidocaine-prilocaine (EMLA) cream Apply 1 application topically as needed. 05/01/21   Cammie Sickle, MD  ondansetron (ZOFRAN) 8 MG tablet Take 1 tablet (8 mg total) by mouth every 8 (eight) hours as needed for nausea or vomiting. 05/01/21   Cammie Sickle, MD  predniSONE (DELTASONE) 20 MG tablet Take  3 pills once a day x 4 days; and then 2 pills once a day x 4 days; and then one pill a day. Once a day with food in AM> Patient not taking: Reported on 05/11/2021 05/01/21    Cammie Sickle, MD  prochlorperazine (COMPAZINE) 10 MG tablet Take 1 tablet (10 mg total) by mouth every 6 (six) hours as needed for nausea or vomiting. 05/01/21   Cammie Sickle, MD     Family History  Problem Relation Age of Onset   Cancer Sister     Social History   Socioeconomic History   Marital status: Married    Spouse name: Not on file   Number of children: Not on file   Years of education: Not on file   Highest education level: Not on file  Occupational History   Not on file  Tobacco Use   Smoking status: Former    Packs/day: 0.50    Years: 50.00    Pack years: 25.00    Types: Cigarettes    Quit date: 04/11/2021    Years since quitting: 0.0   Smokeless tobacco: Never   Tobacco comments:    Quit one month ago  Vaping Use   Vaping Use: Never used  Substance and Sexual Activity   Alcohol use: Never   Drug use: Never   Sexual activity: Not on file  Other Topics Concern   Not on file  Social History Narrative   Lives in [place] with husband; 2 grown sons; quit smoking- June 2022. No alcohol.    Social Determinants of Health   Financial Resource Strain: Not on file  Food Insecurity: Not on file  Transportation Needs: Not on file  Physical Activity: Not on file  Stress: Not on file  Social Connections: Not on file  ECOG Status:  Review of Systems currently denies fever, headache, chest pain, abdominal/back pain, nausea, vomiting or bleeding.  She does have some occasional coughing and dyspnea with exertion.  Vital Signs: BP 126/62   Pulse 80   Resp 18   Ht 4\' 11"  (1.499 m)   Wt 108 lb (49 kg)   SpO2 97%   BMI 21.81 kg/m   Physical Exam awake, alert.  Chest with distant breath sounds bilaterally.  Heart with regular rate and rhythm.  Abdomen soft, positive bowel sounds, nontender.  No lower extremity edema  Imaging: DG Chest 2 View  Result Date: 04/12/2021 CLINICAL DATA:  Chronic cough. EXAM: CHEST - 2 VIEW COMPARISON:  Chest x-ray  dated Mar 26, 2021. FINDINGS: The heart size and mediastinal contours are within normal limits. Normal pulmonary vascularity. Persistent and mildly worsened consolidation in the right middle lobe. No pleural effusion or pneumothorax. No acute osseous abnormality. IMPRESSION: 1. Mildly worsened consolidation in the right middle lobe. Given persistence, further evaluation with chest CT is recommended. Electronically Signed   By: Titus Dubin M.D.   On: 04/12/2021 15:44   CT Chest Wo Contrast  Result Date: 04/12/2021 CLINICAL DATA:  Respiratory illness. Right middle lobe consolidation. EXAM: CT CHEST WITHOUT CONTRAST TECHNIQUE: Multidetector CT imaging of the chest was performed following the standard protocol without IV contrast. COMPARISON:  None. FINDINGS: Cardiovascular: The heart size is normal. Heart size is within normal limits. Small pericardial effusion noted. Coronary artery calcification is evident. Atherosclerotic calcification is noted in the wall of the thoracic aorta. Mediastinum/Nodes: Mediastinal lymphadenopathy evident including 17 mm right paratracheal node on 47/2. Bulky 2.8 cm short axis subcarinal lymph node evident. Right hilar lymphadenopathy noted. There may be some lymphadenopathy in left hilum is well. There is no axillary lymphadenopathy. Lungs/Pleura: Centrilobular emphsyema noted. Multifocal ill-defined nodular consolidative disease is identified in the central right upper lobe, central right middle lobe, and left lower lobe. There is associated volume loss and consolidative opacity in the right middle and lower lobes. Index right lower lobe nodular component measures 2.0 x 1.6 cm on image 77/3. No pleural effusion. Upper Abdomen: 19 mm low-density lesion upper pole right kidney measures water density, likely a cyst. 2.1 cm left adrenal nodule has attenuation too high to be characterized as an adenoma. Musculoskeletal: No worrisome lytic or sclerotic osseous abnormality. IMPRESSION:  1. Multifocal ill-defined nodular consolidative disease in the central right upper lobe, central right middle lobe, and left lower lobe. Index right lower lobe nodular component measures up to 2.0 cm. Findings are associated with bulky mediastinal and right hilar lymphadenopathy. Given persistence, multifocal neoplasm (adenocarcinoma) is a concern. Infectious etiology not excluded. Pulmonary consultation recommended and close follow-up warranted. 2. 2.1 cm left adrenal nodule has attenuation too high to be characterized as an adenoma. Close follow-up recommended. 3. Aortic Atherosclerosis (ICD10-I70.0) and Emphysema (ICD10-J43.9). Electronically Signed   By: Misty Stanley M.D.   On: 04/12/2021 17:29   MR Brain W Wo Contrast  Result Date: 05/05/2021 CLINICAL DATA:  Small cell lung cancer staging. EXAM: MRI HEAD WITHOUT AND WITH CONTRAST TECHNIQUE: Multiplanar, multiecho pulse sequences of the brain and surrounding structures were obtained without and with intravenous contrast. CONTRAST:  28mL GADAVIST GADOBUTROL 1 MMOL/ML IV SOLN COMPARISON:  None. FINDINGS: Brain: Solitary enhancing mass with heterogeneity attributed to necrosis. The mass measures up to 3 cm in diameter on axial slices and 3.7 cm craniocaudal. The mass is centered at the temporoparietal convexity and there is extensive  regional vasogenic edema. There is dural contact without evident dural epicenter. No incidental infarct, hemorrhage, or hydrocephalus. Vascular: Normal flow voids and vascular enhancements Skull and upper cervical spine: Normal marrow signal Sinuses/Orbits: Negative IMPRESSION: Solitary right cerebral mass measuring 3.7 cm at the temporoparietal convexity. Vasogenic edema is extensive and there is local mass effect with 4 mm of midline shift. Electronically Signed   By: Monte Fantasia M.D.   On: 05/05/2021 07:49   NM PET Image Initial (PI) Skull Base To Thigh  Result Date: 05/04/2021 CLINICAL DATA:  Initial treatment strategy  for lung mass. EXAM: NUCLEAR MEDICINE PET SKULL BASE TO THIGH TECHNIQUE: 6.2 mCi F-18 FDG was injected intravenously. Full-ring PET imaging was performed from the skull base to thigh after the radiotracer. CT data was obtained and used for attenuation correction and anatomic localization. Fasting blood glucose: 137 mg/dl COMPARISON:  Chest CT 04/12/2021 FINDINGS: Mediastinal blood pool activity: SUV max 2.1 Liver activity: SUV max NA NECK: Hypermetabolic left supraclavicular lymph node measuring 11 mm short axis with SUV max = 5.4. Incidental CT findings: none CHEST: Bulky soft tissue in the right hilum is markedly hypermetabolic with SUV max = 85.2. Right lower lobe pulmonary nodule measuring 2 cm on previous chest CT is hypermetabolic with SUV max = 5.3. Nodular hypermetabolism along the anterior pleura of the right lung base may reflect pleural disease. 13 mm short axis lymph node in the anterior mediastinum is hypermetabolic with SUV max = 9.3. 9 mm soft tissue nodule in the inferolateral aspect of the left breast (image 89/3) shows low level FDG uptake with SUV max = 2.0. Incidental CT findings: Coronary artery calcification is evident. Atherosclerotic calcification is noted in the wall of the thoracic aorta. Small pericardial effusion evident. As on prior chest CT, there is right middle lobe collapse/consolidation with nodular consolidative opacities in the right upper and lower lobes. ABDOMEN/PELVIS: 1.9 cm left adrenal nodule is hypermetabolic with SUV max = 5.2. Focal hypermetabolism is seen in the region of the right adrenal gland although this area is not well demonstrated on noncontrast CT imaging. This is probably secondary to a right adrenal lesion, a medial right liver lesion cannot be excluded. 2.6 x 1.5 cm right common iliac nodal conglomeration is hypermetabolic with SUV max = 77.8. Additional hypermetabolic lymph nodes track along the right external and internal iliac chains. 14 mm short axis  right external iliac node on 184/3 demonstrates SUV max = 9.2. Small focus of hypermetabolism along the left pelvic sidewall is felt to be within the distal ureter. Incidental CT findings: Atherosclerotic calcification noted in the abdominal aorta. Small cyst noted upper pole right kidney SKELETON: No focal hypermetabolic activity to suggest skeletal metastasis. Incidental CT findings: none IMPRESSION: 1. Hypermetabolic multinodular disease in the right lung with a bulky hypermetabolic soft tissue lesion in the right hilum. This could represent a central primary bronchogenic neoplasm with metastatic disease in the right lung or right lung primary metastatic to the right hilum. There is associated hypermetabolic lymphadenopathy in the left supraclavicular space, left subpectoral space, left axilla, mediastinum, both adrenal glands, right common iliac chain, and right pelvic sidewall. 2.  Aortic Atherosclerois (ICD10-170.0) Electronically Signed   By: Misty Stanley M.D.   On: 05/04/2021 09:50    Labs:  CBC: Recent Labs    05/01/21 1147  WBC 5.3  HGB 12.8  HCT 37.2  PLT 405*    COAGS: No results for input(s): INR, APTT in the last 8760 hours.  BMP:  Recent Labs    05/01/21 1147  NA 132*  K 3.5  CL 95*  CO2 27  GLUCOSE 128*  BUN 9  CALCIUM 8.9  CREATININE 0.55  GFRNONAA >60    LIVER FUNCTION TESTS: Recent Labs    05/01/21 1147  BILITOT 0.7  AST 28  ALT 29  ALKPHOS 117  PROT 8.2*  ALBUMIN 3.1*    TUMOR MARKERS: No results for input(s): AFPTM, CEA, CA199, CHROMGRNA in the last 8760 hours.  Assessment and Plan: 65 y.o. female, ex-smoker, with history of COPD and newly diagnosed small cell carcinoma of the right lung who presents today for Port-A-Cath placement prior to planned chemoradiation.Risks and benefits of image guided port-a-catheter placement was discussed with the patient including, but not limited to bleeding, infection, pneumothorax, or fibrin sheath development  and need for additional procedures.  All of the patient's questions were answered, patient is agreeable to proceed. Consent signed and in chart.    Thank you for this interesting consult.  I greatly enjoyed meeting Cathy Glover and look forward to participating in their care.  A copy of this report was sent to the requesting provider on this date.  Electronically Signed: D. Rowe Robert, PA-C 05/11/2021, 8:17 AM   I spent a total of  25 minutes   in face to face in clinical consultation, greater than 50% of which was counseling/coordinating care for Port-A-Cath placement

## 2021-05-15 ENCOUNTER — Inpatient Hospital Stay: Payer: 59 | Attending: Internal Medicine

## 2021-05-15 ENCOUNTER — Other Ambulatory Visit: Payer: Self-pay

## 2021-05-15 ENCOUNTER — Encounter: Payer: Self-pay | Admitting: Internal Medicine

## 2021-05-15 ENCOUNTER — Ambulatory Visit: Payer: 59

## 2021-05-15 ENCOUNTER — Inpatient Hospital Stay (HOSPITAL_BASED_OUTPATIENT_CLINIC_OR_DEPARTMENT_OTHER): Payer: 59 | Admitting: Internal Medicine

## 2021-05-15 ENCOUNTER — Ambulatory Visit
Admission: RE | Admit: 2021-05-15 | Discharge: 2021-05-15 | Disposition: A | Discharge status: Home / Self Care | Payer: 59 | Source: Ambulatory Visit | Attending: Radiation Oncology | Admitting: Radiation Oncology

## 2021-05-15 DIAGNOSIS — R197 Diarrhea, unspecified: Secondary | ICD-10-CM | POA: Insufficient documentation

## 2021-05-15 DIAGNOSIS — C3431 Malignant neoplasm of lower lobe, right bronchus or lung: Secondary | ICD-10-CM | POA: Insufficient documentation

## 2021-05-15 DIAGNOSIS — C778 Secondary and unspecified malignant neoplasm of lymph nodes of multiple regions: Secondary | ICD-10-CM | POA: Insufficient documentation

## 2021-05-15 DIAGNOSIS — Z510 Encounter for antineoplastic radiation therapy: Secondary | ICD-10-CM | POA: Insufficient documentation

## 2021-05-15 DIAGNOSIS — C7931 Secondary malignant neoplasm of brain: Secondary | ICD-10-CM | POA: Diagnosis present

## 2021-05-15 DIAGNOSIS — Z87891 Personal history of nicotine dependence: Secondary | ICD-10-CM | POA: Diagnosis not present

## 2021-05-15 DIAGNOSIS — C7970 Secondary malignant neoplasm of unspecified adrenal gland: Secondary | ICD-10-CM | POA: Insufficient documentation

## 2021-05-15 DIAGNOSIS — Z79899 Other long term (current) drug therapy: Secondary | ICD-10-CM | POA: Insufficient documentation

## 2021-05-15 DIAGNOSIS — Z5112 Encounter for antineoplastic immunotherapy: Secondary | ICD-10-CM | POA: Diagnosis present

## 2021-05-15 DIAGNOSIS — J449 Chronic obstructive pulmonary disease, unspecified: Secondary | ICD-10-CM | POA: Diagnosis not present

## 2021-05-15 DIAGNOSIS — E860 Dehydration: Secondary | ICD-10-CM | POA: Insufficient documentation

## 2021-05-15 DIAGNOSIS — C3490 Malignant neoplasm of unspecified part of unspecified bronchus or lung: Secondary | ICD-10-CM

## 2021-05-15 DIAGNOSIS — E871 Hypo-osmolality and hyponatremia: Secondary | ICD-10-CM | POA: Diagnosis not present

## 2021-05-15 LAB — COMPREHENSIVE METABOLIC PANEL
ALT: 23 U/L (ref 0–44)
AST: 18 U/L (ref 15–41)
Albumin: 3.1 g/dL — ABNORMAL LOW (ref 3.5–5.0)
Alkaline Phosphatase: 78 U/L (ref 38–126)
Anion gap: 8 (ref 5–15)
BUN: 26 mg/dL — ABNORMAL HIGH (ref 8–23)
CO2: 25 mmol/L (ref 22–32)
Calcium: 8.5 mg/dL — ABNORMAL LOW (ref 8.9–10.3)
Chloride: 99 mmol/L (ref 98–111)
Creatinine, Ser: 0.57 mg/dL (ref 0.44–1.00)
GFR, Estimated: >60 mL/min (ref >60)
Glucose, Bld: 131 mg/dL — ABNORMAL HIGH (ref 70–99)
Potassium: 4.4 mmol/L (ref 3.5–5.1)
Sodium: 132 mmol/L — ABNORMAL LOW (ref 135–145)
Total Bilirubin: 0.7 mg/dL (ref 0.3–1.2)
Total Protein: 7.3 g/dL (ref 6.5–8.1)

## 2021-05-15 LAB — CBC WITH DIFFERENTIAL/PLATELET
Abs Immature Granulocytes: 0.09 10*3/uL — ABNORMAL HIGH (ref 0.00–0.07)
Basophils Absolute: 0 10*3/uL (ref 0.0–0.1)
Basophils Relative: 0 %
Eosinophils Absolute: 0 10*3/uL (ref 0.0–0.5)
Eosinophils Relative: 0 %
HCT: 40.1 % (ref 36.0–46.0)
Hemoglobin: 13.9 g/dL (ref 12.0–15.0)
Immature Granulocytes: 1 %
Lymphocytes Relative: 6 %
Lymphs Abs: 0.8 10*3/uL (ref 0.7–4.0)
MCH: 31.7 pg (ref 26.0–34.0)
MCHC: 34.7 g/dL (ref 30.0–36.0)
MCV: 91.6 fL (ref 80.0–100.0)
Monocytes Absolute: 0.5 10*3/uL (ref 0.1–1.0)
Monocytes Relative: 4 %
Neutro Abs: 10.8 10*3/uL — ABNORMAL HIGH (ref 1.7–7.7)
Neutrophils Relative %: 89 %
Platelets: 369 10*3/uL (ref 150–400)
RBC: 4.38 MIL/uL (ref 3.87–5.11)
RDW: 13.1 % (ref 11.5–15.5)
WBC: 12.2 10*3/uL — ABNORMAL HIGH (ref 4.0–10.5)
nRBC: 0 % (ref 0.0–0.2)

## 2021-05-15 MED ORDER — SODIUM CHLORIDE 0.9 % IV SOLN
100.0000 mg/m2 | Freq: Once | INTRAVENOUS | Status: AC
  Administered 2021-05-15: 140 mg via INTRAVENOUS
  Filled 2021-05-15: qty 7

## 2021-05-15 MED ORDER — SODIUM CHLORIDE 0.9 % IV SOLN
150.0000 mg | Freq: Once | INTRAVENOUS | Status: AC
Start: 2021-05-15 — End: 2021-05-15
  Administered 2021-05-15: 150 mg via INTRAVENOUS
  Filled 2021-05-15: qty 5

## 2021-05-15 MED ORDER — PALONOSETRON HCL INJECTION 0.25 MG/5ML
0.2500 mg | Freq: Once | INTRAVENOUS | Status: AC
  Administered 2021-05-15: 0.25 mg via INTRAVENOUS
  Filled 2021-05-15: qty 5

## 2021-05-15 MED ORDER — SODIUM CHLORIDE 0.9 % IV SOLN
Freq: Once | INTRAVENOUS | Status: AC
Start: 2021-05-15 — End: 2021-05-15
  Filled 2021-05-15: qty 250

## 2021-05-15 MED ORDER — SODIUM CHLORIDE 0.9 % IV SOLN
330.0000 mg | Freq: Once | INTRAVENOUS | Status: AC
  Administered 2021-05-15: 330 mg via INTRAVENOUS
  Filled 2021-05-15: qty 33

## 2021-05-15 MED ORDER — SODIUM CHLORIDE 0.9 % IV SOLN
10.0000 mg | Freq: Once | INTRAVENOUS | Status: AC
  Administered 2021-05-15: 10 mg via INTRAVENOUS
  Filled 2021-05-15: qty 1

## 2021-05-15 MED ORDER — HEPARIN SOD (PORK) LOCK FLUSH 100 UNIT/ML IV SOLN
500.0000 [IU] | Freq: Once | INTRAVENOUS | Status: AC | PRN
  Administered 2021-05-15: 500 [IU]
  Filled 2021-05-15: qty 5

## 2021-05-15 NOTE — Patient Instructions (Signed)
[facility] [place] [facility]  Discharge Instructions: Thank you for choosing  Cancer Center to provide your oncology and hematology care.  If you have a lab appointment with the Cancer Center, please go directly to the Cancer Center and check in at the registration area.  Wear comfortable clothing and clothing appropriate for easy access to any Portacath or PICC line.   We strive to give you quality time with your provider. You may need to reschedule your appointment if you arrive late (15 or more minutes).  Arriving late affects you and other patients whose appointments are after yours.  Also, if you miss three or more appointments without notifying the office, you may be dismissed from the clinic at the provider's discretion.      For prescription refill requests, have your pharmacy contact our office and allow 72 hours for refills to be completed.    Today you received the following chemotherapy and/or immunotherapy agents       To help prevent nausea and vomiting after your treatment, we encourage you to take your nausea medication as directed.  BELOW ARE SYMPTOMS THAT SHOULD BE REPORTED IMMEDIATELY: *FEVER GREATER THAN 100.4 F (38 C) OR HIGHER *CHILLS OR SWEATING *NAUSEA AND VOMITING THAT IS NOT CONTROLLED WITH YOUR NAUSEA MEDICATION *UNUSUAL SHORTNESS OF BREATH *UNUSUAL BRUISING OR BLEEDING *URINARY PROBLEMS (pain or burning when urinating, or frequent urination) *BOWEL PROBLEMS (unusual diarrhea, constipation, pain near the anus) TENDERNESS IN MOUTH AND THROAT WITH OR WITHOUT PRESENCE OF ULCERS (sore throat, sores in mouth, or a toothache) UNUSUAL RASH, SWELLING OR PAIN  UNUSUAL VAGINAL DISCHARGE OR ITCHING   Items with * indicate a potential emergency and should be followed up as soon as possible or go to the Emergency Department if any problems should occur.  Please show the CHEMOTHERAPY ALERT CARD or IMMUNOTHERAPY ALERT CARD at check-in to the Emergency  Department and triage nurse.  Should you have questions after your visit or need to cancel or reschedule your appointment, please contact [facility] [place] [facility]  [phone] and follow the prompts.  Office hours are 8:00 a.m. to 4:30 p.m. Monday - Friday. Please note that voicemails left after 4:00 p.m. may not be returned until the following business day.  We are closed weekends and major holidays. You have access to a nurse at all times for urgent questions. Please call the main number to the clinic [phone] and follow the prompts.  For any non-urgent questions, you may also contact your provider using MyChart. We now offer e-Visits for anyone 18 and older to request care online for non-urgent symptoms. For details visit [email or web address].   Also download the MyChart app! Go to the app store, search "MyChart", open the app, select , and log in with your MyChart username and password.  Due to Covid, a mask is required upon entering the hospital/clinic. If you do not have a mask, one will be given to you upon arrival. For doctor visits, patients may have 1 support person aged 18 or older with them. For treatment visits, patients cannot have anyone with them due to current Covid guidelines and our immunocompromised population.  

## 2021-05-15 NOTE — Progress Notes (Signed)
[facility] NOTE  Patient Care Team: Pcp, No as PCP - General Telford Nab, RN as Oncology Nurse Navigator  CHIEF COMPLAINTS/PURPOSE OF CONSULTATION: lung nodule/mass  #  Oncology History Overview Note  # June 2022-  A. LUNG, RIGHT LOWER LOBE; BIOPSY: [Dr.Gonzalez] SMALL CELL CARCINOMA.    Primary cancer of right lower lobe of lung ([place])  05/01/2021 Initial Diagnosis   Primary cancer of right lower lobe of lung ([place])   05/01/2021 Cancer Staging   Staging form: Lung, AJCC 8th Edition - Clinical: Stage IVB (cT3, cN2, pM1c) - Signed by Cammie Sickle, MD on 05/10/2021  Stage prefix: Initial diagnosis    05/15/2021 -  Chemotherapy    Patient is on Treatment Plan: LUNG SCLC CARBOPLATIN + ETOPOSIDE + ATEZOLIZUMAB INDUCTION Q21D / ATEZOLIZUMAB MAINTENANCE Q21D          HISTORY OF PRESENTING ILLNESS-   Cathy Glover 65 y.o.  female patient with history of smoking [currently quit] metastatic/stage IV small cell lung cancer is here to proceed with chemotherapy/review PET scan MRI brain.  In the interim patient was evaluated by radiation oncology-noted to have brain metastases.  She is starting radiation today.  Needed patient also had a Mediport placed.  Chronic mild shortness of breath.  Appetite is good on steroids.  No headaches.  No significant bleeding.  Review of Systems  Constitutional:  Positive for malaise/fatigue and weight loss. Negative for chills, diaphoresis and fever.  HENT:  Negative for nosebleeds and sore throat.   Eyes:  Negative for double vision.  Respiratory:  Positive for cough, shortness of breath and wheezing. Negative for hemoptysis and sputum production.   Cardiovascular:  Negative for chest pain, palpitations, orthopnea and leg swelling.  Gastrointestinal:  Negative for abdominal pain, blood in stool, constipation, diarrhea, heartburn, melena, nausea and vomiting.  Genitourinary:  Negative for dysuria, frequency and  urgency.  Musculoskeletal:  Negative for back pain and joint pain.  Skin: Negative.  Negative for itching and rash.  Neurological:  Negative for dizziness, tingling, focal weakness, weakness and headaches.  Endo/Heme/Allergies:  Does not bruise/bleed easily.  Psychiatric/Behavioral:  Negative for depression. The patient is not nervous/anxious and does not have insomnia.     MEDICAL HISTORY:  Past Medical History:  Diagnosis Date   Cancer [facility])    Pulmonary nodule     SURGICAL HISTORY: Past Surgical History:  Procedure Laterality Date   IR IMAGING GUIDED PORT INSERTION  05/11/2021   VIDEO BRONCHOSCOPY WITH ENDOBRONCHIAL ULTRASOUND N/A 04/25/2021   Procedure: VIDEO BRONCHOSCOPY WITH ENDOBRONCHIAL ULTRASOUND;  Surgeon: Tyler Pita, MD;  Location: [facility] ORS;  Service: Cardiopulmonary;  Laterality: N/A;    SOCIAL HISTORY: Social History   Socioeconomic History   Marital status: Married    Spouse name: Not on file   Number of children: Not on file   Years of education: Not on file   Highest education level: Not on file  Occupational History   Not on file  Tobacco Use   Smoking status: Former    Packs/day: 0.50    Years: 50.00    Pack years: 25.00    Types: Cigarettes    Quit date: 04/11/2021    Years since quitting: 0.0   Smokeless tobacco: Never   Tobacco comments:    Quit one month ago  Vaping Use   Vaping Use: Never used  Substance and Sexual Activity   Alcohol use: Never   Drug use: Never   Sexual activity: Not on  file  Other Topics Concern   Not on file  Social History Narrative   Lives in [place] with husband; 2 grown sons; quit smoking- June 2022. No alcohol.    Social Determinants of Health   Financial Resource Strain: Not on file  Food Insecurity: Not on file  Transportation Needs: Not on file  Physical Activity: Not on file  Stress: Not on file  Social Connections: Not on file  Intimate Partner Violence: Not on file    FAMILY HISTORY: Family  History  Problem Relation Age of Onset   Cancer Sister     ALLERGIES:  has No Known Allergies.  MEDICATIONS:  Current Outpatient Medications  Medication Sig Dispense Refill   albuterol (VENTOLIN HFA) 108 (90 Base) MCG/ACT inhaler Inhale 1-2 puffs into the lungs every 6 (six) hours as needed for wheezing or shortness of breath. 8 g 0   dexamethasone (DECADRON) 4 MG tablet Take 2 tablets (8 mg total) by mouth 2 (two) times daily. 120 tablet 0   dextromethorphan-guaiFENesin (MUCINEX DM) 30-600 MG 12hr tablet Take 1 tablet by mouth 2 (two) times daily.     lidocaine-prilocaine (EMLA) cream Apply 1 application topically as needed. 30 g 0   loperamide (IMODIUM A-D) 2 MG tablet Take 2 mg by mouth as needed for diarrhea or loose stools.     ondansetron (ZOFRAN) 8 MG tablet Take 1 tablet (8 mg total) by mouth every 8 (eight) hours as needed for nausea or vomiting. (Patient not taking: Reported on 05/15/2021) 20 tablet 0   prochlorperazine (COMPAZINE) 10 MG tablet Take 1 tablet (10 mg total) by mouth every 6 (six) hours as needed for nausea or vomiting. (Patient not taking: Reported on 05/15/2021) 30 tablet 0   No current facility-administered medications for this visit.      Marland Kitchen  PHYSICAL EXAMINATION: ECOG PERFORMANCE STATUS: 1 - Symptomatic but completely ambulatory  Vitals:   05/15/21 0921  BP: 127/77  Pulse: 84  Resp: 16  Temp: 97.7 F (36.5 C)  SpO2: 96%   Filed Weights   05/15/21 0921  Weight: 103 lb 13.4 oz (47.1 kg)    Physical Exam Vitals and nursing note reviewed.  Constitutional:      Comments: Ambulating: In a wheelchair.  Accompanied: with husband.   HENT:     Head: Normocephalic and atraumatic.     Mouth/Throat:     Pharynx: Oropharynx is clear.  Eyes:     Extraocular Movements: Extraocular movements intact.     Pupils: Pupils are equal, round, and reactive to light.  Cardiovascular:     Rate and Rhythm: Normal rate and regular rhythm.  Pulmonary:     Comments:  Decreased breath sounds bilaterally.  Abdominal:     Palpations: Abdomen is soft.  Musculoskeletal:        General: Normal range of motion.     Cervical back: Normal range of motion.  Skin:    General: Skin is warm.  Neurological:     General: No focal deficit present.     Mental Status: She is alert and oriented to person, place, and time.  Psychiatric:        Behavior: Behavior normal.        Judgment: Judgment normal.    LABORATORY DATA:  I have reviewed the data as listed Lab Results  Component Value Date   WBC 12.2 (H) 05/15/2021   HGB 13.9 05/15/2021   HCT 40.1 05/15/2021   MCV 91.6 05/15/2021   PLT 369  05/15/2021   Recent Labs    05/01/21 1147 05/15/21 0907  NA 132* 132*  K 3.5 4.4  CL 95* 99  CO2 27 25  GLUCOSE 128* 131*  BUN 9 26*  CREATININE 0.55 0.57  CALCIUM 8.9 8.5*  GFRNONAA >60 >60  PROT 8.2* 7.3  ALBUMIN 3.1* 3.1*  AST 28 18  ALT 29 23  ALKPHOS 117 78  BILITOT 0.7 0.7    RADIOGRAPHIC STUDIES: I have personally reviewed the radiological images as listed and agreed with the findings in the report. MR Brain W Wo Contrast  Result Date: 05/05/2021 CLINICAL DATA:  Small cell lung cancer staging. EXAM: MRI HEAD WITHOUT AND WITH CONTRAST TECHNIQUE: Multiplanar, multiecho pulse sequences of the brain and surrounding structures were obtained without and with intravenous contrast. CONTRAST:  34mL GADAVIST GADOBUTROL 1 MMOL/ML IV SOLN COMPARISON:  None. FINDINGS: Brain: Solitary enhancing mass with heterogeneity attributed to necrosis. The mass measures up to 3 cm in diameter on axial slices and 3.7 cm craniocaudal. The mass is centered at the temporoparietal convexity and there is extensive regional vasogenic edema. There is dural contact without evident dural epicenter. No incidental infarct, hemorrhage, or hydrocephalus. Vascular: Normal flow voids and vascular enhancements Skull and upper cervical spine: Normal marrow signal Sinuses/Orbits: Negative  IMPRESSION: Solitary right cerebral mass measuring 3.7 cm at the temporoparietal convexity. Vasogenic edema is extensive and there is local mass effect with 4 mm of midline shift. Electronically Signed   By: Monte Fantasia M.D.   On: 05/05/2021 07:49   NM PET Image Initial (PI) Skull Base To Thigh  Result Date: 05/04/2021 CLINICAL DATA:  Initial treatment strategy for lung mass. EXAM: NUCLEAR MEDICINE PET SKULL BASE TO THIGH TECHNIQUE: 6.2 mCi F-18 FDG was injected intravenously. Full-ring PET imaging was performed from the skull base to thigh after the radiotracer. CT data was obtained and used for attenuation correction and anatomic localization. Fasting blood glucose: 137 mg/dl COMPARISON:  Chest CT 04/12/2021 FINDINGS: Mediastinal blood pool activity: SUV max 2.1 Liver activity: SUV max NA NECK: Hypermetabolic left supraclavicular lymph node measuring 11 mm short axis with SUV max = 5.4. Incidental CT findings: none CHEST: Bulky soft tissue in the right hilum is markedly hypermetabolic with SUV max = 31.5. Right lower lobe pulmonary nodule measuring 2 cm on previous chest CT is hypermetabolic with SUV max = 5.3. Nodular hypermetabolism along the anterior pleura of the right lung base may reflect pleural disease. 13 mm short axis lymph node in the anterior mediastinum is hypermetabolic with SUV max = 9.3. 9 mm soft tissue nodule in the inferolateral aspect of the left breast (image 89/3) shows low level FDG uptake with SUV max = 2.0. Incidental CT findings: Coronary artery calcification is evident. Atherosclerotic calcification is noted in the wall of the thoracic aorta. Small pericardial effusion evident. As on prior chest CT, there is right middle lobe collapse/consolidation with nodular consolidative opacities in the right upper and lower lobes. ABDOMEN/PELVIS: 1.9 cm left adrenal nodule is hypermetabolic with SUV max = 5.2. Focal hypermetabolism is seen in the region of the right adrenal gland although  this area is not well demonstrated on noncontrast CT imaging. This is probably secondary to a right adrenal lesion, a medial right liver lesion cannot be excluded. 2.6 x 1.5 cm right common iliac nodal conglomeration is hypermetabolic with SUV max = 40.0. Additional hypermetabolic lymph nodes track along the right external and internal iliac chains. 14 mm short axis right external iliac node  on 184/3 demonstrates SUV max = 9.2. Small focus of hypermetabolism along the left pelvic sidewall is felt to be within the distal ureter. Incidental CT findings: Atherosclerotic calcification noted in the abdominal aorta. Small cyst noted upper pole right kidney SKELETON: No focal hypermetabolic activity to suggest skeletal metastasis. Incidental CT findings: none IMPRESSION: 1. Hypermetabolic multinodular disease in the right lung with a bulky hypermetabolic soft tissue lesion in the right hilum. This could represent a central primary bronchogenic neoplasm with metastatic disease in the right lung or right lung primary metastatic to the right hilum. There is associated hypermetabolic lymphadenopathy in the left supraclavicular space, left subpectoral space, left axilla, mediastinum, both adrenal glands, right common iliac chain, and right pelvic sidewall. 2.  Aortic Atherosclerois (ICD10-170.0) Electronically Signed   By: Misty Stanley M.D.   On: 05/04/2021 09:50   IR IMAGING GUIDED PORT INSERTION  Result Date: 05/11/2021 INDICATION: 65 year old female referred for port catheter EXAM: IMAGE GUIDED PORT CATHETER MEDICATIONS: None ANESTHESIA/SEDATION: Moderate (conscious) sedation was employed during this procedure. A total of Versed 0.5 mg and Fentanyl 50 mcg was administered intravenously. Moderate Sedation Time: 20 minutes. The patient's level of consciousness and vital signs were monitored continuously by radiology nursing throughout the procedure under my direct supervision. FLUOROSCOPY TIME:  Fluoroscopy Time: 0 minutes  12 seconds (0.53 mGy). COMPLICATIONS: None PROCEDURE: The procedure, risks, benefits, and alternatives were explained to the patient. Questions regarding the procedure were encouraged and answered. The patient understands and consents to the procedure. Ultrasound survey was performed with images stored and sent to PACs. The right neck and chest was prepped with chlorhexidine, and draped in the usual sterile fashion using maximum barrier technique (cap and mask, sterile gown, sterile gloves, large sterile sheet, hand hygiene and cutaneous antiseptic). Local anesthesia was attained by infiltration with 1% lidocaine without epinephrine. Ultrasound demonstrated patency of the right internal jugular vein, and this was documented with an image. Under real-time ultrasound guidance, this vein was accessed with a 21 gauge micropuncture needle and image documentation was performed. A small dermatotomy was made at the access site with an 11 scalpel. A 0.018" wire was advanced into the SVC and used to estimate the length of the internal catheter. The access needle exchanged for a 44F micropuncture vascular sheath. The 0.018" wire was then removed and a 0.035" wire advanced into the IVC. An appropriate location for the subcutaneous reservoir was selected below the clavicle and an incision was made through the skin and underlying soft tissues. The subcutaneous tissues were then dissected using a combination of blunt and sharp surgical technique and a pocket was formed. A single lumen power injectable portacatheter was then tunneled through the subcutaneous tissues from the pocket to the dermatotomy and the port reservoir placed within the subcutaneous pocket. The venous access site was then serially dilated and a peel away vascular sheath placed over the wire. The wire was removed and the port catheter advanced into position under fluoroscopic guidance. The catheter tip is positioned in the cavoatrial junction. This was documented  with a spot image. The portacatheter was then tested and found to flush and aspirate well. The port was flushed with saline followed by 100 units/mL heparinized saline. The pocket was then closed in two layers using first subdermal inverted interrupted absorbable sutures followed by a running subcuticular suture. The epidermis was then sealed with Dermabond. The dermatotomy at the venous access site was also seal with Dermabond. Patient tolerated the procedure well and remained hemodynamically stable  throughout. No complications encountered and no significant blood loss encountered IMPRESSION: Status post right IJ port catheter. Signed, Dulcy Fanny. Dellia Nims, RPVI [facility] Electronically Signed   By: Corrie Mckusick D.O.   On: 05/11/2021 10:24    ASSESSMENT & PLAN:   Primary cancer of right lower lobe of lung ([place]) #Stage IV/extensive stage small cell lung cancer-with metastasis to the right hilum/supraclavicular/subpectoral/axillary adenopathy; adrenal metastases and pelvic adenopathy.  June 2022 brain MRI-metastasis  # Given the bulky systemic disease-proceed with carboplatin etoposide #1-day 1 only [because of ongoing whole brain radiation-concern for toxicity; see below].  Patient/husband understand treatments are palliative not curative.  Again reviewed the potential side effects including but not limited to hair loss nausea vomiting diarrhea risk of infections etc.  Reminded antiemetics as needed.  # Brain metastasis-3.5 cm temporoparietal mass/surrounding edema-on dexamethasone [per RT]; 1 whole brain RT- started on 7/05- until 7/22.   # COPD [quit smoking] continue albuterol inhaler; will need to add subsequent inhalers.  # I reviewed the blood work- with the patient in detail; also reviewed the imaging independently [as summarized above]; and with the patient in detail.   # 40 minutes face-to-face with the patient discussing the above  plan of care; more than 50% of time spent on prognosis/ natural history; counseling and coordination.  # DISPOSITION # chemo today # cancel chemo- 7/06 & 7/07.  # follow up in 1 week- MD; labs- cbc/bmp; No chemo # follow up in 3 weeks-[[place]]; MD labs- cbc/cmp; day-1 carbo-etop-Atezo; d-2 &3- etop- Dr.B  All questions were answered. The patient knows to call the clinic with any problems, questions or concerns.    Cammie Sickle, MD 05/15/2021 10:28 PM

## 2021-05-15 NOTE — Assessment & Plan Note (Addendum)
#  Stage IV/extensive stage small cell lung cancer-with metastasis to the right hilum/supraclavicular/subpectoral/axillary adenopathy; adrenal metastases and pelvic adenopathy.  June 2022 brain MRI-metastasis  # Given the bulky systemic disease-proceed with carboplatin etoposide #1-day 1 only [because of ongoing whole brain radiation-concern for toxicity; see below].  Patient/husband understand treatments are palliative not curative.  Again reviewed the potential side effects including but not limited to hair loss nausea vomiting diarrhea risk of infections etc.  Reminded antiemetics as needed.  # Brain metastasis-3.5 cm temporoparietal mass/surrounding edema-on dexamethasone [per RT]; 1 whole brain RT- started on 7/05- until 7/22.   # COPD [quit smoking] continue albuterol inhaler; will need to add subsequent inhalers.  # I reviewed the blood work- with the patient in detail; also reviewed the imaging independently [as summarized above]; and with the patient in detail.   # 40 minutes face-to-face with the patient discussing the above plan of care; more than 50% of time spent on prognosis/ natural history; counseling and coordination.  # DISPOSITION # chemo today # cancel chemo- 7/06 & 7/07.  # follow up in 1 week- MD; labs- cbc/bmp; No chemo # follow up in 3 weeks-[[place]]; MD labs- cbc/cmp; day-1 carbo-etop-Atezo; d-2 &3- etop- Dr.B

## 2021-05-16 ENCOUNTER — Ambulatory Visit: Payer: 59

## 2021-05-16 ENCOUNTER — Encounter: Payer: Self-pay | Admitting: *Deleted

## 2021-05-16 ENCOUNTER — Telehealth: Payer: Self-pay

## 2021-05-16 ENCOUNTER — Ambulatory Visit
Admission: RE | Admit: 2021-05-16 | Discharge: 2021-05-16 | Disposition: A | Discharge status: Home / Self Care | Payer: 59 | Source: Ambulatory Visit | Attending: Radiation Oncology | Admitting: Radiation Oncology

## 2021-05-16 DIAGNOSIS — C7931 Secondary malignant neoplasm of brain: Secondary | ICD-10-CM | POA: Diagnosis not present

## 2021-05-16 NOTE — Progress Notes (Signed)
Met with patient and her husband during radiation treatment today. Pt received first cycle of chemotherapy yesterday and stated that everything went well. Pt states she is feeling well today. All questions answered during visit. Instructed pt and husband to call with any further questions or needs. Pt verbalized understanding.

## 2021-05-16 NOTE — Telephone Encounter (Signed)
Telephone call to patient for follow up after receiving first infusion.   Patient husband states infusion went great.  States eating good and drinking plenty of fluids.   Denies any nausea or vomiting.  Encouraged patient to call for any concerns or questions.

## 2021-05-17 ENCOUNTER — Ambulatory Visit: Payer: 59

## 2021-05-17 ENCOUNTER — Ambulatory Visit
Admission: RE | Admit: 2021-05-17 | Discharge: 2021-05-17 | Disposition: A | Discharge status: Home / Self Care | Payer: 59 | Source: Ambulatory Visit | Attending: Radiation Oncology | Admitting: Radiation Oncology

## 2021-05-17 DIAGNOSIS — C7931 Secondary malignant neoplasm of brain: Secondary | ICD-10-CM | POA: Diagnosis not present

## 2021-05-18 ENCOUNTER — Ambulatory Visit
Admission: RE | Admit: 2021-05-18 | Discharge: 2021-05-18 | Disposition: A | Discharge status: Home / Self Care | Payer: 59 | Source: Ambulatory Visit | Attending: Radiation Oncology | Admitting: Radiation Oncology

## 2021-05-18 DIAGNOSIS — C7931 Secondary malignant neoplasm of brain: Secondary | ICD-10-CM | POA: Diagnosis not present

## 2021-05-21 ENCOUNTER — Ambulatory Visit
Admission: RE | Admit: 2021-05-21 | Discharge: 2021-05-21 | Disposition: A | Discharge status: Home / Self Care | Payer: 59 | Source: Ambulatory Visit | Attending: Radiation Oncology | Admitting: Radiation Oncology

## 2021-05-21 DIAGNOSIS — C7931 Secondary malignant neoplasm of brain: Secondary | ICD-10-CM | POA: Diagnosis not present

## 2021-05-22 ENCOUNTER — Other Ambulatory Visit: Payer: Self-pay | Admitting: *Deleted

## 2021-05-22 ENCOUNTER — Ambulatory Visit
Admission: RE | Admit: 2021-05-22 | Discharge: 2021-05-22 | Disposition: A | Discharge status: Home / Self Care | Payer: 59 | Source: Ambulatory Visit | Attending: Radiation Oncology | Admitting: Radiation Oncology

## 2021-05-22 ENCOUNTER — Ambulatory Visit: Payer: 59 | Admitting: Internal Medicine

## 2021-05-22 ENCOUNTER — Other Ambulatory Visit: Payer: 59

## 2021-05-22 DIAGNOSIS — C3431 Malignant neoplasm of lower lobe, right bronchus or lung: Secondary | ICD-10-CM

## 2021-05-22 DIAGNOSIS — C7931 Secondary malignant neoplasm of brain: Secondary | ICD-10-CM | POA: Diagnosis not present

## 2021-05-23 ENCOUNTER — Encounter: Payer: Self-pay | Admitting: Internal Medicine

## 2021-05-23 ENCOUNTER — Inpatient Hospital Stay (HOSPITAL_BASED_OUTPATIENT_CLINIC_OR_DEPARTMENT_OTHER): Payer: 59 | Admitting: Internal Medicine

## 2021-05-23 ENCOUNTER — Inpatient Hospital Stay: Payer: 59

## 2021-05-23 ENCOUNTER — Encounter: Payer: Self-pay | Admitting: *Deleted

## 2021-05-23 ENCOUNTER — Other Ambulatory Visit: Payer: Self-pay

## 2021-05-23 ENCOUNTER — Inpatient Hospital Stay (HOSPITAL_BASED_OUTPATIENT_CLINIC_OR_DEPARTMENT_OTHER): Payer: 59

## 2021-05-23 ENCOUNTER — Ambulatory Visit
Admission: RE | Admit: 2021-05-23 | Discharge: 2021-05-23 | Disposition: A | Discharge status: Home / Self Care | Payer: 59 | Source: Ambulatory Visit | Attending: Radiation Oncology | Admitting: Radiation Oncology

## 2021-05-23 VITALS — BP 119/76

## 2021-05-23 DIAGNOSIS — C3431 Malignant neoplasm of lower lobe, right bronchus or lung: Secondary | ICD-10-CM

## 2021-05-23 DIAGNOSIS — C7931 Secondary malignant neoplasm of brain: Secondary | ICD-10-CM | POA: Diagnosis not present

## 2021-05-23 DIAGNOSIS — Z5112 Encounter for antineoplastic immunotherapy: Secondary | ICD-10-CM | POA: Diagnosis not present

## 2021-05-23 DIAGNOSIS — Z79899 Other long term (current) drug therapy: Secondary | ICD-10-CM

## 2021-05-23 DIAGNOSIS — C7970 Secondary malignant neoplasm of unspecified adrenal gland: Secondary | ICD-10-CM

## 2021-05-23 DIAGNOSIS — Z87891 Personal history of nicotine dependence: Secondary | ICD-10-CM

## 2021-05-23 DIAGNOSIS — C778 Secondary and unspecified malignant neoplasm of lymph nodes of multiple regions: Secondary | ICD-10-CM

## 2021-05-23 DIAGNOSIS — J449 Chronic obstructive pulmonary disease, unspecified: Secondary | ICD-10-CM

## 2021-05-23 DIAGNOSIS — E860 Dehydration: Secondary | ICD-10-CM

## 2021-05-23 LAB — CBC WITH DIFFERENTIAL/PLATELET
Abs Immature Granulocytes: 0.05 10*3/uL (ref 0.00–0.07)
Basophils Absolute: 0 10*3/uL (ref 0.0–0.1)
Basophils Relative: 0 %
Eosinophils Absolute: 0 10*3/uL (ref 0.0–0.5)
Eosinophils Relative: 0 %
HCT: 37.1 % (ref 36.0–46.0)
Hemoglobin: 13 g/dL (ref 12.0–15.0)
Immature Granulocytes: 1 %
Lymphocytes Relative: 15 %
Lymphs Abs: 1 10*3/uL (ref 0.7–4.0)
MCH: 31.9 pg (ref 26.0–34.0)
MCHC: 35 g/dL (ref 30.0–36.0)
MCV: 90.9 fL (ref 80.0–100.0)
Monocytes Absolute: 0.2 10*3/uL (ref 0.1–1.0)
Monocytes Relative: 4 %
Neutro Abs: 5.2 10*3/uL (ref 1.7–7.7)
Neutrophils Relative %: 80 %
Platelets: 192 10*3/uL (ref 150–400)
RBC: 4.08 MIL/uL (ref 3.87–5.11)
RDW: 12.7 % (ref 11.5–15.5)
WBC: 6.5 10*3/uL (ref 4.0–10.5)
nRBC: 0 % (ref 0.0–0.2)

## 2021-05-23 LAB — BASIC METABOLIC PANEL
Anion gap: 9 (ref 5–15)
BUN: 24 mg/dL — ABNORMAL HIGH (ref 8–23)
CO2: 24 mmol/L (ref 22–32)
Calcium: 8.4 mg/dL — ABNORMAL LOW (ref 8.9–10.3)
Chloride: 96 mmol/L — ABNORMAL LOW (ref 98–111)
Creatinine, Ser: 0.49 mg/dL (ref 0.44–1.00)
GFR, Estimated: >60 mL/min (ref >60)
Glucose, Bld: 122 mg/dL — ABNORMAL HIGH (ref 70–99)
Potassium: 4.2 mmol/L (ref 3.5–5.1)
Sodium: 129 mmol/L — ABNORMAL LOW (ref 135–145)

## 2021-05-23 MED ORDER — SODIUM CHLORIDE 0.9 % IV SOLN
INTRAVENOUS | Status: AC
  Filled 2021-05-23 (×2): qty 250

## 2021-05-23 MED ORDER — HEPARIN SOD (PORK) LOCK FLUSH 100 UNIT/ML IV SOLN
500.0000 [IU] | Freq: Once | INTRAVENOUS | Status: AC
Start: 2021-05-23 — End: 2021-05-23
  Administered 2021-05-23: 500 [IU] via INTRAVENOUS
  Filled 2021-05-23: qty 5

## 2021-05-23 MED ORDER — SODIUM CHLORIDE 0.9% FLUSH
10.0000 mL | INTRAVENOUS | Status: DC | PRN
Start: 2021-05-23 — End: 2021-05-24
  Administered 2021-05-23: 10 mL via INTRAVENOUS
  Filled 2021-05-23: qty 10

## 2021-05-23 NOTE — Progress Notes (Signed)
Some tingling in fingertips, had some diarrhea and now no BM for a couple days.

## 2021-05-23 NOTE — Assessment & Plan Note (Addendum)
#  Stage IV/extensive stage small cell lung cancer-with metastasis to the right hilum/supraclavicular/subpectoral/axillary adenopathy; adrenal metastases and pelvic adenopathy.  June 2022 brain MRI-metastasis  #  S/p-  with carboplatin etoposide #1-day 1 only [because of ongoing whole brain radiation-concern for toxicity; see below].  After finishing whole brain radiation we will plan to start cycle #2.   # Brain metastasis-3.5 cm temporoparietal mass/surrounding edema-on dexamethasone [per RT]; 1 whole brain RT- started on 7/05- until 7/22.  Stable.  # Diarrhea/constipation: [ last BM- 2 days]- trial of muralx  # COPD [quit smoking] continue albuterol inhaler; will need to add subsequent inhalers.  # hyponatremia- 129; dehydration- plan IVFs; discussed re; increased   # DISPOSITION # IVFs over 1 hour # follow up in 1 week-labs- cbc/bmp; IVfs over 1 hour # follow up as planned -[[place]]; MD labs- cbc/cmp; day-1 carbo-etop-Atezo; d-2 &3- etop- Dr.B

## 2021-05-23 NOTE — Progress Notes (Signed)
[place] NOTE  Patient Care Team: Pcp, No as PCP - General Telford Nab, RN as Oncology Nurse Navigator  CHIEF COMPLAINTS/PURPOSE OF CONSULTATION: Extensive stage small cell lung cancer  #  Oncology History Overview Note  # June 2022-  A. LUNG, RIGHT LOWER LOBE; BIOPSY: [Dr.Gonzalez] SMALL CELL CARCINOMA.    Primary cancer of right lower lobe of lung ([place])  05/01/2021 Initial Diagnosis   Primary cancer of right lower lobe of lung ([place])   05/01/2021 Cancer Staging   Staging form: Lung, AJCC 8th Edition - Clinical: Stage IVB (cT3, cN2, pM1c) - Signed by Cammie Sickle, MD on 05/10/2021  Stage prefix: Initial diagnosis    05/15/2021 -  Chemotherapy    Patient is on Treatment Plan: LUNG SCLC CARBOPLATIN + ETOPOSIDE + ATEZOLIZUMAB INDUCTION Q21D / ATEZOLIZUMAB MAINTENANCE Q21D          HISTORY OF PRESENTING ILLNESS-   Cathy Glover 65 y.o.  female patient with history of smoking [currently quit] metastatic/stage IV small cell lung cancer currently on hold with radiation.;  Patient also underwent chemotherapy cycle 1 carboplatin/etoposide day 1 only approximately 10 days ago.  Patient tolerated chemotherapy fairly well.  Complains of moderate fatigue.  Mild nausea no significant vomiting.  Chronic cough chronic shortness of breath not any worse.   Review of Systems  Constitutional:  Positive for malaise/fatigue and weight loss. Negative for chills, diaphoresis and fever.  HENT:  Negative for nosebleeds and sore throat.   Eyes:  Negative for double vision.  Respiratory:  Positive for cough, shortness of breath and wheezing. Negative for hemoptysis and sputum production.   Cardiovascular:  Negative for chest pain, palpitations, orthopnea and leg swelling.  Gastrointestinal:  Negative for abdominal pain, blood in stool, constipation, diarrhea, heartburn, melena, nausea and vomiting.  Genitourinary:  Negative for dysuria, frequency and urgency.   Musculoskeletal:  Negative for back pain and joint pain.  Skin: Negative.  Negative for itching and rash.  Neurological:  Negative for dizziness, tingling, focal weakness, weakness and headaches.  Endo/Heme/Allergies:  Does not bruise/bleed easily.  Psychiatric/Behavioral:  Negative for depression. The patient is not nervous/anxious and does not have insomnia.     MEDICAL HISTORY:  Past Medical History:  Diagnosis Date   Cancer [facility])    Pulmonary nodule     SURGICAL HISTORY: Past Surgical History:  Procedure Laterality Date   IR IMAGING GUIDED PORT INSERTION  05/11/2021   VIDEO BRONCHOSCOPY WITH ENDOBRONCHIAL ULTRASOUND N/A 04/25/2021   Procedure: VIDEO BRONCHOSCOPY WITH ENDOBRONCHIAL ULTRASOUND;  Surgeon: Tyler Pita, MD;  Location: [facility] ORS;  Service: Cardiopulmonary;  Laterality: N/A;    SOCIAL HISTORY: Social History   Socioeconomic History   Marital status: Married    Spouse name: Not on file   Number of children: Not on file   Years of education: Not on file   Highest education level: Not on file  Occupational History   Not on file  Tobacco Use   Smoking status: Former    Packs/day: 0.50    Years: 50.00    Pack years: 25.00    Types: Cigarettes    Quit date: 04/11/2021    Years since quitting: 0.1   Smokeless tobacco: Never   Tobacco comments:    Quit one month ago  Vaping Use   Vaping Use: Never used  Substance and Sexual Activity   Alcohol use: Never   Drug use: Never   Sexual activity: Not on file  Other Topics Concern  Not on file  Social History Narrative   Lives in [place] with husband; 2 grown sons; quit smoking- June 2022. No alcohol.    Social Determinants of Health   Financial Resource Strain: Not on file  Food Insecurity: Not on file  Transportation Needs: Not on file  Physical Activity: Not on file  Stress: Not on file  Social Connections: Not on file  Intimate Partner Violence: Not on file    FAMILY HISTORY: Family History   Problem Relation Age of Onset   Cancer Sister     ALLERGIES:  has No Known Allergies.  MEDICATIONS:  No current facility-administered medications for this visit.   Current Outpatient Medications  Medication Sig Dispense Refill   albuterol (VENTOLIN HFA) 108 (90 Base) MCG/ACT inhaler Inhale 1-2 puffs into the lungs every 6 (six) hours as needed for wheezing or shortness of breath. 8 g 0   dextromethorphan-guaiFENesin (MUCINEX DM) 30-600 MG 12hr tablet Take 1 tablet by mouth 2 (two) times daily.     lidocaine-prilocaine (EMLA) cream Apply 1 application topically as needed. 30 g 0   Budeson-Glycopyrrol-Formoterol (BREZTRI AEROSPHERE) 160-9-4.8 MCG/ACT AERO Inhale 2 puffs into the lungs in the morning and at bedtime. 5.9 g 0   dexamethasone (DECADRON) 4 MG tablet Take 1 tablet (4 mg total) by mouth 2 (two) times daily. 60 tablet 0   loperamide (IMODIUM A-D) 2 MG tablet Take 2 mg by mouth as needed for diarrhea or loose stools.     ondansetron (ZOFRAN) 8 MG tablet Take 1 tablet (8 mg total) by mouth every 8 (eight) hours as needed for nausea or vomiting. (Patient not taking: No sig reported) 20 tablet 0   prochlorperazine (COMPAZINE) 10 MG tablet Take 1 tablet (10 mg total) by mouth every 6 (six) hours as needed for nausea or vomiting. (Patient not taking: No sig reported) 30 tablet 0   Facility-Administered Medications Ordered in Other Visits  Medication Dose Route Frequency Provider Last Rate Last Admin   acetaminophen (TYLENOL) tablet 650 mg  650 mg Oral Q6H PRN Agbata, Tochukwu, MD       Or   acetaminophen (TYLENOL) suppository 650 mg  650 mg Rectal Q6H PRN Agbata, Tochukwu, MD       budesonide (PULMICORT) nebulizer solution 0.25 mg  0.25 mg Nebulization BID Dorothe Pea, RPH       [START ON 06/18/2021] ceFEPIme (MAXIPIME) 2 g in sodium chloride 0.9 % 100 mL IVPB  2 g Intravenous Q12H Dolan, Carissa E, RPH       dexamethasone (DECADRON) tablet 4 mg  4 mg Oral BID Agbata, Tochukwu, MD    4 mg at 06/17/21 2206   dextromethorphan-guaiFENesin (MUCINEX DM) 30-600 MG per 12 hr tablet 1 tablet  1 tablet Oral BID Agbata, Tochukwu, MD   1 tablet at 06/17/21 2207   iohexol (OMNIPAQUE) 350 MG/ML injection 75 mL  75 mL Intravenous Once PRN Nena Polio, MD       lactated ringers 1,000 mL with potassium chloride 40 mEq infusion   Intravenous Continuous Agbata, Tochukwu, MD 125 mL/hr at 06/17/21 1553 New Bag at 06/17/21 1553   ondansetron (ZOFRAN) tablet 4 mg  4 mg Oral Q6H PRN Agbata, Tochukwu, MD       Or   ondansetron (ZOFRAN) injection 4 mg  4 mg Intravenous Q6H PRN Agbata, Tochukwu, MD       potassium chloride (KLOR-CON) packet 40 mEq  40 mEq Oral Daily Nena Polio, MD   40  mEq at 06/17/21 1410   [START ON 06/18/2021] umeclidinium-vilanterol (ANORO ELLIPTA) 62.5-25 MCG/INH 1 puff  1 puff Inhalation Daily Dorothe Pea, RPH       [START ON 06/18/2021] vancomycin (VANCOREADY) IVPB 750 mg/150 mL  750 mg Intravenous Q24H Dorothe Pea, RPH          .  PHYSICAL EXAMINATION: ECOG PERFORMANCE STATUS: 1 - Symptomatic but completely ambulatory  Vitals:   05/23/21 1449  BP: 120/77  Pulse: 89  Resp: 18  Temp: (!) 96.9 F (36.1 C)  SpO2: 99%   Filed Weights   05/23/21 1449  Weight: 102 lb (46.3 kg)    Physical Exam Vitals and nursing note reviewed.  Constitutional:      Comments: Ambulating: In a wheelchair.  Accompanied: with husband.   HENT:     Head: Normocephalic and atraumatic.     Mouth/Throat:     Pharynx: Oropharynx is clear.  Eyes:     Extraocular Movements: Extraocular movements intact.     Pupils: Pupils are equal, round, and reactive to light.  Cardiovascular:     Rate and Rhythm: Normal rate and regular rhythm.  Pulmonary:     Comments: Decreased breath sounds bilaterally.  Abdominal:     Palpations: Abdomen is soft.  Musculoskeletal:        General: Normal range of motion.     Cervical back: Normal range of motion.  Skin:    General: Skin is  warm.  Neurological:     General: No focal deficit present.     Mental Status: She is alert and oriented to person, place, and time.  Psychiatric:        Behavior: Behavior normal.        Judgment: Judgment normal.    LABORATORY DATA:  I have reviewed the data as listed Lab Results  Component Value Date   WBC 0.8 (LL) 06/17/2021   HGB 9.7 (L) 06/17/2021   HCT 27.1 (L) 06/17/2021   MCV 88.9 06/17/2021   PLT 81 (L) 06/17/2021   Recent Labs    06/05/21 0839 06/05/21 0946 06/12/21 1325 06/17/21 1104  NA 129*  --  128* 126*  K 4.4  --  3.4* 2.7*  CL 96*  --  96* 88*  CO2 22  --  24 25  GLUCOSE 260*  --  169* 212*  BUN 37*  --  27* 12  CREATININE 0.64  --  0.46 0.61  CALCIUM 8.8*  --  8.0* 8.5*  GFRNONAA >60  --  >60 >60  PROT  --  6.4* 6.1* 7.4  ALBUMIN  --  3.1* 2.8* 2.4*  AST  --  25 27 32  ALT  --  38 26 63*  ALKPHOS  --  57 63 134*  BILITOT  --  0.5 0.6 0.9  BILIDIR  --  <0.1  --   --   IBILI  --  NOT CALCULATED  --   --     RADIOGRAPHIC STUDIES: I have personally reviewed the radiological images as listed and agreed with the findings in the report. CT Maxillofacial W Contrast  Result Date: 06/17/2021 CLINICAL DATA:  Maxillofacial pain Swelling in the eyelids. History of metastatic lung cancer. EXAM: CT MAXILLOFACIAL WITH CONTRAST TECHNIQUE: Multidetector CT imaging of the maxillofacial structures was performed with intravenous contrast. Multiplanar CT image reconstructions were also generated. CONTRAST:  29mL OMNIPAQUE IOHEXOL 350 MG/ML SOLN COMPARISON:  Head MRI 05/04/2021.  PET-CT 05/03/2021. FINDINGS: Osseous: No acute fracture  or destructive osseous process. Edentulous. Orbits: Moderate swelling/thickening of the preseptal soft tissues bilaterally, left greater than right. No fluid collection, postseptal inflammation, or intraorbital mass. Grossly intact globes. Sinuses: Small volume fluid in the left sphenoid sinus. Underdeveloped right mastoid air cells which are  partially chronically opacified with persistent fluid in the right middle ear cavity as well. Clear left mastoid air cells. Soft tissues: Soft tissue swelling involving the upper lip. Partially visualized subocclusive thrombus in the right internal jugular vein in the mid and lower neck above a Port-A-Cath. Partially visualized lymphadenopathy in the left lower neck and left supraclavicular region with the largest node covered on today's study measuring 1.1 cm in short axis, overall smaller than on the prior PET-CT. Limited intracranial: Mild vasogenic edema in the right temporoparietal region, less than on the prior MRI and with the known underlying mass not well demonstrated on this maxillofacial CT. IMPRESSION: 1. Bilateral periorbital and upper lip soft tissue swelling. No evidence of postseptal cellulitis or abscess. 2. Partially visualized subocclusive thrombus in the right internal jugular vein above a Port-A-Cath. 3. Partially visualized left lower neck and left supraclavicular lymphadenopathy, likely improved from the prior PET-CT. 4. Decreased right temporoparietal vasogenic edema. Electronically Signed   By: Logan Bores M.D.   On: 06/17/2021 13:04   DG Chest Portable 1 View  Result Date: 06/17/2021 CLINICAL DATA:  Sepsis, lung cancer, ongoing chemotherapy EXAM: PORTABLE CHEST 1 VIEW COMPARISON:  04/12/2021 FINDINGS: The heart size and mediastinal contours are within normal limits. Right chest port catheter. Irregular and nodular opacity projecting the perihilar right lung and right lung base, in keeping with known primary lung malignancy. No new or acute appearing airspace disease. The visualized skeletal structures are unremarkable. IMPRESSION: Irregular and nodular opacity projecting the perihilar right lung and right lung base, in keeping with known primary lung malignancy. No new or acute appearing airspace disease. Electronically Signed   By: Eddie Candle M.D.   On: 06/17/2021 11:46   DG Fluoro  Guide CV Line Right  Result Date: 06/06/2021 CLINICAL DATA:  Small-cell lung carcinoma. No blood return from port a cath, placed 05/11/2021 by Dr. Earleen Newport. EXAM: PORT  CATHETER INJECTION UNDER FLUOROSCOPY TECHNIQUE: The procedure, risks (including but not limited to bleeding, infection, organ damage ), benefits, and alternatives were explained to the patient. Questions regarding the procedure were encouraged and answered. The patient understands and consents to the procedure. Survey fluoroscopic inspection reveals stable position of right IJ power port, tip at the cavoatrial junction. I was able to easily aspirate blood from the access needle which had been previously placed. Injection demonstrates patency of the reservoir and tubing. No leak. Contrast flows freely from the tip of the catheter into the SVC. No evidence of venous thrombosis or significant fibrin sheath. IMPRESSION: 1. Normal port injection.  Okay for routine use. Consider CathFlow per protocol if aspiration problems persist, as small incomplete fibrin sheaths may be inapparent on injection. Electronically Signed   By: Lucrezia Europe M.D.   On: 06/06/2021 11:40    ASSESSMENT & PLAN:   Primary cancer of right lower lobe of lung ([place]) #Stage IV/extensive stage small cell lung cancer-with metastasis to the right hilum/supraclavicular/subpectoral/axillary adenopathy; adrenal metastases and pelvic adenopathy.  June 2022 brain MRI-metastasis  #  S/p-  with carboplatin etoposide #1-day 1 only [because of ongoing whole brain radiation-concern for toxicity; see below].  After finishing whole brain radiation we will plan to start cycle #2.   # Brain metastasis-3.5 cm  temporoparietal mass/surrounding edema-on dexamethasone [per RT]; 1 whole brain RT- started on 7/05- until 7/22.  Stable.  # Diarrhea/constipation: [ last BM- 2 days]- trial of muralx  # COPD [quit smoking] continue albuterol inhaler; will need to add subsequent inhalers.  #  hyponatremia- 129; dehydration- plan IVFs; discussed re; increased   # DISPOSITION # IVFs over 1 hour # follow up in 1 week-labs- cbc/bmp; IVfs over 1 hour # follow up as planned -[[place]]; MD labs- cbc/cmp; day-1 carbo-etop-Atezo; d-2 &3- etop- Dr.B  All questions were answered. The patient knows to call the clinic with any problems, questions or concerns.    Cammie Sickle, MD 06/17/2021 10:08 PM

## 2021-05-24 ENCOUNTER — Ambulatory Visit
Admission: RE | Admit: 2021-05-24 | Discharge: 2021-05-24 | Disposition: A | Discharge status: Home / Self Care | Payer: 59 | Source: Ambulatory Visit | Attending: Radiation Oncology | Admitting: Radiation Oncology

## 2021-05-24 ENCOUNTER — Encounter: Payer: Self-pay | Admitting: Internal Medicine

## 2021-05-24 ENCOUNTER — Other Ambulatory Visit: Payer: Self-pay

## 2021-05-24 ENCOUNTER — Ambulatory Visit: Payer: 59

## 2021-05-24 ENCOUNTER — Encounter: Payer: Self-pay | Admitting: Pulmonary Disease

## 2021-05-24 ENCOUNTER — Ambulatory Visit (INDEPENDENT_AMBULATORY_CARE_PROVIDER_SITE_OTHER): Payer: 59 | Admitting: Pulmonary Disease

## 2021-05-24 VITALS — BP 122/78 | HR 91 | Temp 97.7°F | Ht 59.0 in | Wt 104.0 lb

## 2021-05-24 DIAGNOSIS — R059 Cough, unspecified: Secondary | ICD-10-CM | POA: Diagnosis not present

## 2021-05-24 DIAGNOSIS — C3491 Malignant neoplasm of unspecified part of right bronchus or lung: Secondary | ICD-10-CM | POA: Diagnosis not present

## 2021-05-24 DIAGNOSIS — C7931 Secondary malignant neoplasm of brain: Secondary | ICD-10-CM | POA: Diagnosis not present

## 2021-05-24 DIAGNOSIS — J449 Chronic obstructive pulmonary disease, unspecified: Secondary | ICD-10-CM

## 2021-05-24 MED ORDER — BREZTRI AEROSPHERE 160-9-4.8 MCG/ACT IN AERO: 2.0000 | INHALATION_SPRAY | Freq: Two times a day (BID) | RESPIRATORY_TRACT | 0 refills | Status: AC

## 2021-05-24 NOTE — Progress Notes (Signed)
Subjective:    Patient ID: Cathy Glover, female    DOB: Nov 23, 1955, 65 y.o.   MRN: [id number] Chief Complaint  Patient presents with   Follow-up    Recent bx--cough has improved but is still present--occ dry cough and sob with exertion.    HPI  Patient is a 65 year old former smoker (quit 1 month) who presents for follow-up after bronchoscopy performed 25 April 2021.  This is a scheduled visit.  She presents today with her husband.  She was diagnosed with small cell carcinoma of the lung.   She had extensive disease on the right. She is stage IV by recent MRI and PET/CT.  She presented initially with a cough of 2 years duration which had gotten worse 6 months prior to initial evaluation.  Since her bronchoscopy she has noted that she has had some improvement of her cough but continues to have some issues with this.  She did have significant obstruction of the right middle lobe bronchus and right bronchus intermedius.  She has been noted to have a brain metastasis and is receiving radiation therapy for this.  She has received 1 chemotherapy treatment but this is on hold until the issue into the brain is completed.  She continues to have some issues with cough however this markedly improved from prior.  She is not using albuterol much but albuterol does help with her cough.  He has not had any fevers, chills or sweats.  No chest pain.  Despite all of her issues she is in good spirits.  Review of Systems A 10 point review of systems was performed and it is as noted above otherwise negative.  Patient Active Problem List   Diagnosis Date Noted   Goals of care, counseling/discussion 05/10/2021   Primary cancer of right lower lobe of lung ([place]) 05/01/2021   Social History   Tobacco Use   Smoking status: Former    Packs/day: 0.50    Years: 50.00    Pack years: 25.00    Types: Cigarettes    Quit date: 04/11/2021    Years since quitting: 0.1   Smokeless tobacco: Never   Tobacco comments:     Quit one month ago  Substance Use Topics   Alcohol use: Never   No Known Allergies  Current Meds  Medication Sig   albuterol (VENTOLIN HFA) 108 (90 Base) MCG/ACT inhaler Inhale 1-2 puffs into the lungs every 6 (six) hours as needed for wheezing or shortness of breath.   Budeson-Glycopyrrol-Formoterol (BREZTRI AEROSPHERE) 160-9-4.8 MCG/ACT AERO Inhale 2 puffs into the lungs in the morning and at bedtime.   dexamethasone (DECADRON) 4 MG tablet Take 2 tablets (8 mg total) by mouth 2 (two) times daily.   dextromethorphan-guaiFENesin (MUCINEX DM) 30-600 MG 12hr tablet Take 1 tablet by mouth 2 (two) times daily.   lidocaine-prilocaine (EMLA) cream Apply 1 application topically as needed.   loperamide (IMODIUM A-D) 2 MG tablet Take 2 mg by mouth as needed for diarrhea or loose stools.   ondansetron (ZOFRAN) 8 MG tablet Take 1 tablet (8 mg total) by mouth every 8 (eight) hours as needed for nausea or vomiting.   prochlorperazine (COMPAZINE) 10 MG tablet Take 1 tablet (10 mg total) by mouth every 6 (six) hours as needed for nausea or vomiting.    There is no immunization history on file for this patient.     Objective:   Physical Exam BP 122/78 (BP Location: Left Arm, Cuff Size: Normal)   Pulse 91  Temp 97.7 F (36.5 C) (Temporal)   Ht 4\' 11"  (1.499 m)   Wt 104 lb (47.2 kg)   SpO2 97%   BMI 21.01 kg/m  GENERAL: Well-developed, well-nourished woman, no acute distress.  Looks older than stated age.  Presents in transport chair.  Flat affect. HEAD: Normocephalic, atraumatic.  EYES: Pupils equal, round, reactive to light.  No scleral icterus.  MOUTH: Nose/mouth/throat not examined due to masking requirements for COVID 19. NECK: Supple. No thyromegaly. Trachea midline. No JVD.  No adenopathy. PULMONARY: Good air entry bilaterally.  Coarse, otherwise, no adventitious sounds. CARDIOVASCULAR: S1 and S2. Regular rate and rhythm.  No rubs, murmurs or gallops heard. ABDOMEN:  Benign. MUSCULOSKELETAL: No joint deformity, no clubbing, no edema.  NEUROLOGIC: No focal deficit, no gait disturbance, speech is fluent. SKIN: Intact,warm,dry.  On limited exam no rashes. PSYCH: Flat affect, normal behavior     Assessment & Plan:     ICD-10-CM   1. COPD mixed type ([place])  J44.9    We will give her a trial of Breztri 2 puffs twice a day Samples given    2. Small cell carcinoma of lung, right ([facility])  C34.91    Stage IV Getting radiation/chemotherapy Management per oncology    3. Cough  R05.9    Likely due to atelectasis from endobronchial obstruction This should improve with chemotherapy     Meds ordered this encounter  Medications   Budeson-Glycopyrrol-Formoterol (BREZTRI AEROSPHERE) 160-9-4.8 MCG/ACT AERO    Sig: Inhale 2 puffs into the lungs in the morning and at bedtime.    Dispense:  5.9 g    Refill:  0    Order Specific Question:   Lot Number?    Answer:   [id number]    Order Specific Question:   Expiration Date?    Answer:   10/12/2023    Order Specific Question:   Manufacturer?    Answer:   [employer] [[id number]]    Order Specific Question:   Quantity    Answer:   4   See the patient in follow-up in 2 months time she is to contact us prior to that time should any new difficulties arise.   Renold Don, MD [facility]   *This note was dictated using voice recognition software/Dragon.  Despite best efforts to proofread, errors can occur which can change the meaning.  Any change was purely unintentional.

## 2021-05-24 NOTE — Patient Instructions (Signed)
We are giving a trial of Breztri 2 puffs twice a day this is an inhaler that should help you with your cough and breathing.  Make sure you rinse your mouth well after you use it.  You may use a little baking soda in the water to help with rinsing.  We will see you in follow-up in 2 months time call sooner should any new problems arise.

## 2021-05-25 ENCOUNTER — Ambulatory Visit
Admission: RE | Admit: 2021-05-25 | Discharge: 2021-05-25 | Disposition: A | Discharge status: Home / Self Care | Payer: 59 | Source: Ambulatory Visit | Attending: Radiation Oncology | Admitting: Radiation Oncology

## 2021-05-25 ENCOUNTER — Ambulatory Visit: Payer: 59

## 2021-05-25 ENCOUNTER — Encounter: Payer: Self-pay | Admitting: Internal Medicine

## 2021-05-25 DIAGNOSIS — C7931 Secondary malignant neoplasm of brain: Secondary | ICD-10-CM | POA: Diagnosis not present

## 2021-05-25 NOTE — Progress Notes (Signed)
Met with pt's husband in the lobby while pt getting lab work drawn. No questions or needs at this time. Instructed to call with any further questions or needs. Pt's husband verbalized understanding.

## 2021-05-28 ENCOUNTER — Ambulatory Visit: Payer: 59

## 2021-05-28 ENCOUNTER — Ambulatory Visit
Admission: RE | Admit: 2021-05-28 | Discharge: 2021-05-28 | Disposition: A | Discharge status: Home / Self Care | Payer: 59 | Source: Ambulatory Visit | Attending: Radiation Oncology | Admitting: Radiation Oncology

## 2021-05-28 ENCOUNTER — Ambulatory Visit: Admission: RE | Admit: 2021-05-28 | Payer: 59 | Source: Ambulatory Visit

## 2021-05-28 DIAGNOSIS — C7931 Secondary malignant neoplasm of brain: Secondary | ICD-10-CM | POA: Diagnosis not present

## 2021-05-29 ENCOUNTER — Ambulatory Visit
Admission: RE | Admit: 2021-05-29 | Discharge: 2021-05-29 | Disposition: A | Discharge status: Home / Self Care | Payer: 59 | Source: Ambulatory Visit | Attending: Radiation Oncology | Admitting: Radiation Oncology

## 2021-05-29 ENCOUNTER — Ambulatory Visit: Payer: 59

## 2021-05-29 DIAGNOSIS — C7931 Secondary malignant neoplasm of brain: Secondary | ICD-10-CM | POA: Diagnosis not present

## 2021-05-30 ENCOUNTER — Ambulatory Visit
Admission: RE | Admit: 2021-05-30 | Discharge: 2021-05-30 | Disposition: A | Discharge status: Home / Self Care | Payer: 59 | Source: Ambulatory Visit | Attending: Radiation Oncology | Admitting: Radiation Oncology

## 2021-05-30 ENCOUNTER — Ambulatory Visit: Payer: 59

## 2021-05-30 DIAGNOSIS — C7931 Secondary malignant neoplasm of brain: Secondary | ICD-10-CM | POA: Diagnosis not present

## 2021-05-31 ENCOUNTER — Inpatient Hospital Stay: Payer: 59

## 2021-05-31 ENCOUNTER — Ambulatory Visit
Admission: RE | Admit: 2021-05-31 | Discharge: 2021-05-31 | Disposition: A | Discharge status: Home / Self Care | Payer: 59 | Source: Ambulatory Visit | Attending: Radiation Oncology | Admitting: Radiation Oncology

## 2021-05-31 VITALS — BP 155/76 | HR 85

## 2021-05-31 DIAGNOSIS — C3431 Malignant neoplasm of lower lobe, right bronchus or lung: Secondary | ICD-10-CM

## 2021-05-31 DIAGNOSIS — Z5112 Encounter for antineoplastic immunotherapy: Secondary | ICD-10-CM | POA: Diagnosis not present

## 2021-05-31 DIAGNOSIS — C7931 Secondary malignant neoplasm of brain: Secondary | ICD-10-CM | POA: Diagnosis not present

## 2021-05-31 LAB — CBC WITH DIFFERENTIAL/PLATELET
Abs Immature Granulocytes: 0.19 10*3/uL — ABNORMAL HIGH (ref 0.00–0.07)
Basophils Absolute: 0 10*3/uL (ref 0.0–0.1)
Basophils Relative: 1 %
Eosinophils Absolute: 0 10*3/uL (ref 0.0–0.5)
Eosinophils Relative: 0 %
HCT: 37.1 % (ref 36.0–46.0)
Hemoglobin: 12.7 g/dL (ref 12.0–15.0)
Immature Granulocytes: 5 %
Lymphocytes Relative: 16 %
Lymphs Abs: 0.7 10*3/uL (ref 0.7–4.0)
MCH: 31.4 pg (ref 26.0–34.0)
MCHC: 34.2 g/dL (ref 30.0–36.0)
MCV: 91.6 fL (ref 80.0–100.0)
Monocytes Absolute: 0.2 10*3/uL (ref 0.1–1.0)
Monocytes Relative: 5 %
Neutro Abs: 3 10*3/uL (ref 1.7–7.7)
Neutrophils Relative %: 73 %
Platelets: 171 10*3/uL (ref 150–400)
RBC: 4.05 MIL/uL (ref 3.87–5.11)
RDW: 13.2 % (ref 11.5–15.5)
WBC: 4.1 10*3/uL (ref 4.0–10.5)
nRBC: 0 % (ref 0.0–0.2)

## 2021-05-31 LAB — BASIC METABOLIC PANEL
Anion gap: 11 (ref 5–15)
BUN: 24 mg/dL — ABNORMAL HIGH (ref 8–23)
CO2: 20 mmol/L — ABNORMAL LOW (ref 22–32)
Calcium: 8.3 mg/dL — ABNORMAL LOW (ref 8.9–10.3)
Chloride: 99 mmol/L (ref 98–111)
Creatinine, Ser: 0.61 mg/dL (ref 0.44–1.00)
GFR, Estimated: >60 mL/min (ref >60)
Glucose, Bld: 148 mg/dL — ABNORMAL HIGH (ref 70–99)
Potassium: 4.1 mmol/L (ref 3.5–5.1)
Sodium: 130 mmol/L — ABNORMAL LOW (ref 135–145)

## 2021-05-31 MED ORDER — HEPARIN SOD (PORK) LOCK FLUSH 100 UNIT/ML IV SOLN
INTRAVENOUS | Status: AC
  Filled 2021-05-31: qty 5

## 2021-05-31 MED ORDER — HEPARIN SOD (PORK) LOCK FLUSH 100 UNIT/ML IV SOLN
500.0000 [IU] | Freq: Once | INTRAVENOUS | Status: DC | PRN
  Filled 2021-05-31: qty 5

## 2021-05-31 MED ORDER — SODIUM CHLORIDE 0.9% FLUSH
10.0000 mL | Freq: Once | INTRAVENOUS | Status: AC
  Administered 2021-05-31: 10 mL via INTRAVENOUS
  Filled 2021-05-31: qty 10

## 2021-05-31 MED ORDER — HEPARIN SOD (PORK) LOCK FLUSH 100 UNIT/ML IV SOLN
500.0000 [IU] | Freq: Once | INTRAVENOUS | Status: AC
Start: 2021-05-31 — End: 2021-05-31
  Administered 2021-05-31: 500 [IU] via INTRAVENOUS
  Filled 2021-05-31: qty 5

## 2021-05-31 MED ORDER — SODIUM CHLORIDE 0.9 % IV SOLN
Freq: Once | INTRAVENOUS | Status: AC
  Filled 2021-05-31: qty 250

## 2021-05-31 NOTE — Progress Notes (Signed)
Nutrition Follow-up:    Patient with stage IV small cell lung cancer with metastases to brain.  Patient to complete whole brain radiation tomorrow 7/22.  Patient receiving chemotherapy as well.  Met with patient during fluids today.  Patient reports that her appetite is good.  Denies nausea.  Husband helps prepare meals.  Does not like oral nutrition supplements.  Says that she usually has a sandwich for breakfast.  Sometimes eats lunch.  Supper is usually meat and couple side items. Likes to drink milk.      Medications: reviewed  Labs: reviewed  Anthropometrics:   Weight 104 lb on 7/14  108 lb 9.6 oz on 6/21 110-11 lb per patient   NUTRITION DIAGNOSIS: Unintentional weight loss continues   INTERVENTION:  Reviewed ways to add calories and protein in diet. Handout provided Encouraged patient to increase milk consumption to at least BID for more calories and protein     MONITORING, EVALUATION, GOAL: weight trends, intake   NEXT VISIT: Monday, August 29th following MD appointment  Chrishaun Sasso B. Zenia Resides, [place], [place] Registered Dietitian [phone] (mobile)

## 2021-05-31 NOTE — Patient Instructions (Addendum)

## 2021-06-01 ENCOUNTER — Ambulatory Visit
Admission: RE | Admit: 2021-06-01 | Discharge: 2021-06-01 | Disposition: A | Discharge status: Home / Self Care | Payer: 59 | Source: Ambulatory Visit | Attending: Radiation Oncology | Admitting: Radiation Oncology

## 2021-06-01 DIAGNOSIS — C7931 Secondary malignant neoplasm of brain: Secondary | ICD-10-CM | POA: Diagnosis not present

## 2021-06-04 ENCOUNTER — Other Ambulatory Visit: Payer: Self-pay | Admitting: *Deleted

## 2021-06-04 MED ORDER — DEXAMETHASONE 4 MG PO TABS: 4.0000 mg | ORAL_TABLET | Freq: Two times a day (BID) | ORAL | 0 refills | Status: DC

## 2021-06-04 NOTE — Telephone Encounter (Signed)
Spoke with Dr. Rogue Bussing- will renew dex 4 mg tablet twice daily. #60. New script to pharmacy I called the patient's husband back and left a detailed vm re: the new prescription.

## 2021-06-04 NOTE — Telephone Encounter (Signed)
Dr. Jacinto Reap  - please advise re: dex refill

## 2021-06-04 NOTE — Telephone Encounter (Signed)
Patient Husband called asking for more Dexamethasone if warranted stating that Dr Grayland Ormond was the one who ordered it for patient. Please advise if you want her to continue and reorder if so

## 2021-06-05 ENCOUNTER — Other Ambulatory Visit: Payer: Self-pay

## 2021-06-05 ENCOUNTER — Ambulatory Visit: Payer: 59

## 2021-06-05 ENCOUNTER — Inpatient Hospital Stay: Payer: 59

## 2021-06-05 ENCOUNTER — Encounter: Payer: Self-pay | Admitting: Internal Medicine

## 2021-06-05 ENCOUNTER — Inpatient Hospital Stay (HOSPITAL_BASED_OUTPATIENT_CLINIC_OR_DEPARTMENT_OTHER): Payer: 59 | Admitting: Internal Medicine

## 2021-06-05 VITALS — BP 109/68 | HR 72 | Resp 18

## 2021-06-05 VITALS — BP 123/63 | HR 98 | Temp 96.5°F | Resp 14 | Wt 103.7 lb

## 2021-06-05 DIAGNOSIS — Z5112 Encounter for antineoplastic immunotherapy: Secondary | ICD-10-CM | POA: Diagnosis not present

## 2021-06-05 DIAGNOSIS — C3431 Malignant neoplasm of lower lobe, right bronchus or lung: Secondary | ICD-10-CM | POA: Diagnosis not present

## 2021-06-05 DIAGNOSIS — Z452 Encounter for adjustment and management of vascular access device: Secondary | ICD-10-CM

## 2021-06-05 LAB — CBC WITH DIFFERENTIAL/PLATELET
Abs Immature Granulocytes: 0.22 10*3/uL — ABNORMAL HIGH (ref 0.00–0.07)
Basophils Absolute: 0 10*3/uL (ref 0.0–0.1)
Basophils Relative: 0 %
Eosinophils Absolute: 0 10*3/uL (ref 0.0–0.5)
Eosinophils Relative: 0 %
HCT: 36.8 % (ref 36.0–46.0)
Hemoglobin: 12.9 g/dL (ref 12.0–15.0)
Immature Granulocytes: 4 %
Lymphocytes Relative: 8 %
Lymphs Abs: 0.5 10*3/uL — ABNORMAL LOW (ref 0.7–4.0)
MCH: 31.9 pg (ref 26.0–34.0)
MCHC: 35.1 g/dL (ref 30.0–36.0)
MCV: 90.9 fL (ref 80.0–100.0)
Monocytes Absolute: 0.2 10*3/uL (ref 0.1–1.0)
Monocytes Relative: 4 %
Neutro Abs: 5.1 10*3/uL (ref 1.7–7.7)
Neutrophils Relative %: 84 %
Platelets: 162 10*3/uL (ref 150–400)
RBC: 4.05 MIL/uL (ref 3.87–5.11)
RDW: 14 % (ref 11.5–15.5)
WBC: 6 10*3/uL (ref 4.0–10.5)
nRBC: 0 % (ref 0.0–0.2)

## 2021-06-05 LAB — BASIC METABOLIC PANEL
Anion gap: 11 (ref 5–15)
BUN: 37 mg/dL — ABNORMAL HIGH (ref 8–23)
CO2: 22 mmol/L (ref 22–32)
Calcium: 8.8 mg/dL — ABNORMAL LOW (ref 8.9–10.3)
Chloride: 96 mmol/L — ABNORMAL LOW (ref 98–111)
Creatinine, Ser: 0.64 mg/dL (ref 0.44–1.00)
GFR, Estimated: >60 mL/min (ref >60)
Glucose, Bld: 260 mg/dL — ABNORMAL HIGH (ref 70–99)
Potassium: 4.4 mmol/L (ref 3.5–5.1)
Sodium: 129 mmol/L — ABNORMAL LOW (ref 135–145)

## 2021-06-05 LAB — HEPATIC FUNCTION PANEL
ALT: 38 U/L (ref 0–44)
AST: 25 U/L (ref 15–41)
Albumin: 3.1 g/dL — ABNORMAL LOW (ref 3.5–5.0)
Alkaline Phosphatase: 57 U/L (ref 38–126)
Bilirubin, Direct: <0.1 mg/dL (ref 0.0–0.2)
Total Bilirubin: 0.5 mg/dL (ref 0.3–1.2)
Total Protein: 6.4 g/dL — ABNORMAL LOW (ref 6.5–8.1)

## 2021-06-05 MED ORDER — SODIUM CHLORIDE 0.9 % IV SOLN
100.0000 mg/m2 | Freq: Once | INTRAVENOUS | Status: AC
  Administered 2021-06-05: 140 mg via INTRAVENOUS
  Filled 2021-06-05: qty 7

## 2021-06-05 MED ORDER — SODIUM CHLORIDE 0.9 % IV SOLN
10.0000 mg | Freq: Once | INTRAVENOUS | Status: AC
  Administered 2021-06-05: 10 mg via INTRAVENOUS
  Filled 2021-06-05: qty 1

## 2021-06-05 MED ORDER — HEPARIN SOD (PORK) LOCK FLUSH 100 UNIT/ML IV SOLN
500.0000 [IU] | Freq: Once | INTRAVENOUS | Status: AC
  Administered 2021-06-05: 500 [IU] via INTRAVENOUS
  Filled 2021-06-05: qty 5

## 2021-06-05 MED ORDER — SODIUM CHLORIDE 0.9 % IV SOLN
1200.0000 mg | Freq: Once | INTRAVENOUS | Status: AC
  Administered 2021-06-05: 1200 mg via INTRAVENOUS
  Filled 2021-06-05: qty 20

## 2021-06-05 MED ORDER — SODIUM CHLORIDE 0.9 % IV SOLN
330.0000 mg | Freq: Once | INTRAVENOUS | Status: AC
  Administered 2021-06-05: 330 mg via INTRAVENOUS
  Filled 2021-06-05: qty 33

## 2021-06-05 MED ORDER — SODIUM CHLORIDE 0.9 % IV SOLN
Freq: Once | INTRAVENOUS | Status: AC
  Filled 2021-06-05: qty 250

## 2021-06-05 MED ORDER — PALONOSETRON HCL INJECTION 0.25 MG/5ML
0.2500 mg | Freq: Once | INTRAVENOUS | Status: AC
  Administered 2021-06-05: 0.25 mg via INTRAVENOUS
  Filled 2021-06-05: qty 5

## 2021-06-05 MED ORDER — SODIUM CHLORIDE 0.9 % IV SOLN
150.0000 mg | Freq: Once | INTRAVENOUS | Status: AC
  Administered 2021-06-05: 150 mg via INTRAVENOUS
  Filled 2021-06-05: qty 5

## 2021-06-05 NOTE — Patient Instructions (Signed)
[facility] [place] [facility] [place]  Discharge Instructions: Thank you for choosing [facility] to provide your oncology and hematology care.  If you have a lab appointment with the Cancer Center, please go directly to the Cancer Center and check in at the registration area.  Wear comfortable clothing and clothing appropriate for easy access to any Portacath or PICC line.   We strive to give you quality time with your provider. You may need to reschedule your appointment if you arrive late (15 or more minutes).  Arriving late affects you and other patients whose appointments are after yours.  Also, if you miss three or more appointments without notifying the office, you may be dismissed from the clinic at the provider's discretion.      For prescription refill requests, have your pharmacy contact our office and allow 72 hours for refills to be completed.    Today you received the following chemotherapy and/or immunotherapy agents       To help prevent nausea and vomiting after your treatment, we encourage you to take your nausea medication as directed.  BELOW ARE SYMPTOMS THAT SHOULD BE REPORTED IMMEDIATELY: *FEVER GREATER THAN 100.4 F (38 C) OR HIGHER *CHILLS OR SWEATING *NAUSEA AND VOMITING THAT IS NOT CONTROLLED WITH YOUR NAUSEA MEDICATION *UNUSUAL SHORTNESS OF BREATH *UNUSUAL BRUISING OR BLEEDING *URINARY PROBLEMS (pain or burning when urinating, or frequent urination) *BOWEL PROBLEMS (unusual diarrhea, constipation, pain near the anus) TENDERNESS IN MOUTH AND THROAT WITH OR WITHOUT PRESENCE OF ULCERS (sore throat, sores in mouth, or a toothache) UNUSUAL RASH, SWELLING OR PAIN  UNUSUAL VAGINAL DISCHARGE OR ITCHING   Items with * indicate a potential emergency and should be followed up as soon as possible or go to the Emergency Department if any problems should occur.  Please show the CHEMOTHERAPY ALERT CARD or IMMUNOTHERAPY ALERT CARD at check-in to the Emergency  Department and triage nurse.  Should you have questions after your visit or need to cancel or reschedule your appointment, please contact [facility] [place] [facility]  [phone] and follow the prompts.  Office hours are 8:00 a.m. to 4:30 p.m. Monday - Friday. Please note that voicemails left after 4:00 p.m. may not be returned until the following business day.  We are closed weekends and major holidays. You have access to a nurse at all times for urgent questions. Please call the main number to the clinic [phone] and follow the prompts.  For any non-urgent questions, you may also contact your provider using MyChart. We now offer e-Visits for anyone 18 and older to request care online for non-urgent symptoms. For details visit [email or web address].   Also download the MyChart app! Go to the app store, search "MyChart", open the app, select [facility], and log in with your MyChart username and password.  Due to Covid, a mask is required upon entering the hospital/clinic. If you do not have a mask, one will be given to you upon arrival. For doctor visits, patients may have 1 support person aged 18 or older with them. For treatment visits, patients cannot have anyone with them due to current Covid guidelines and our immunocompromised population.  

## 2021-06-05 NOTE — Progress Notes (Signed)
[facility] NOTE  Patient Care Team: Pcp, No as PCP - General Telford Nab, RN as Oncology Nurse Navigator  CHIEF COMPLAINTS/PURPOSE OF CONSULTATION: lung nodule/mass  #  Oncology History Overview Note  # June 2022-  A. LUNG, RIGHT LOWER LOBE; BIOPSY: [Dr.Gonzalez] SMALL CELL CARCINOMA.    Primary cancer of right lower lobe of lung ([place])  05/01/2021 Initial Diagnosis   Primary cancer of right lower lobe of lung ([place])   05/01/2021 Cancer Staging   Staging form: Lung, AJCC 8th Edition - Clinical: Stage IVB (cT3, cN2, pM1c) - Signed by Cammie Sickle, MD on 05/10/2021  Stage prefix: Initial diagnosis    05/15/2021 -  Chemotherapy    Patient is on Treatment Plan: LUNG SCLC CARBOPLATIN + ETOPOSIDE + ATEZOLIZUMAB INDUCTION Q21D / ATEZOLIZUMAB MAINTENANCE Q21D          HISTORY OF PRESENTING ILLNESS-   Quinnlyn Hearns 65 y.o.  female patient with history of smoking [currently quit] metastatic/stage IV small cell lung cancer is here to proceed with chemotherapy/.  Patient is stable for brain radiation; approximately 4 days ago.  Patient denies any nausea vomiting.  Any fevers or chills.  Shortness of breath on exertion.  Otherwise no worsening cough or hemoptysis.  Review of Systems  Constitutional:  Positive for malaise/fatigue and weight loss. Negative for chills, diaphoresis and fever.  HENT:  Negative for nosebleeds and sore throat.   Eyes:  Negative for double vision.  Respiratory:  Positive for cough and shortness of breath. Negative for hemoptysis and sputum production.   Cardiovascular:  Negative for chest pain, palpitations, orthopnea and leg swelling.  Gastrointestinal:  Negative for abdominal pain, blood in stool, constipation, diarrhea, heartburn, melena, nausea and vomiting.  Genitourinary:  Negative for dysuria, frequency and urgency.  Musculoskeletal:  Negative for back pain and joint pain.  Skin: Negative.  Negative for itching and  rash.  Neurological:  Negative for dizziness, tingling, focal weakness, weakness and headaches.  Endo/Heme/Allergies:  Does not bruise/bleed easily.  Psychiatric/Behavioral:  Negative for depression. The patient is not nervous/anxious and does not have insomnia.     MEDICAL HISTORY:  Past Medical History:  Diagnosis Date   Cancer [facility])    Pulmonary nodule     SURGICAL HISTORY: Past Surgical History:  Procedure Laterality Date   IR IMAGING GUIDED PORT INSERTION  05/11/2021   VIDEO BRONCHOSCOPY WITH ENDOBRONCHIAL ULTRASOUND N/A 04/25/2021   Procedure: VIDEO BRONCHOSCOPY WITH ENDOBRONCHIAL ULTRASOUND;  Surgeon: Tyler Pita, MD;  Location: [facility] ORS;  Service: Cardiopulmonary;  Laterality: N/A;    SOCIAL HISTORY: Social History   Socioeconomic History   Marital status: Married    Spouse name: Not on file   Number of children: Not on file   Years of education: Not on file   Highest education level: Not on file  Occupational History   Not on file  Tobacco Use   Smoking status: Former    Packs/day: 0.50    Years: 50.00    Pack years: 25.00    Types: Cigarettes    Quit date: 04/11/2021    Years since quitting: 0.1   Smokeless tobacco: Never   Tobacco comments:    Quit one month ago  Vaping Use   Vaping Use: Never used  Substance and Sexual Activity   Alcohol use: Never   Drug use: Never   Sexual activity: Not on file  Other Topics Concern   Not on file  Social History Narrative   Lives  in [place] with husband; 2 grown sons; quit smoking- June 2022. No alcohol.    Social Determinants of Health   Financial Resource Strain: Not on file  Food Insecurity: Not on file  Transportation Needs: Not on file  Physical Activity: Not on file  Stress: Not on file  Social Connections: Not on file  Intimate Partner Violence: Not on file    FAMILY HISTORY: Family History  Problem Relation Age of Onset   Cancer Sister     ALLERGIES:  has No Known Allergies.  MEDICATIONS:   Current Outpatient Medications  Medication Sig Dispense Refill   albuterol (VENTOLIN HFA) 108 (90 Base) MCG/ACT inhaler Inhale 1-2 puffs into the lungs every 6 (six) hours as needed for wheezing or shortness of breath. 8 g 0   Budeson-Glycopyrrol-Formoterol (BREZTRI AEROSPHERE) 160-9-4.8 MCG/ACT AERO Inhale 2 puffs into the lungs in the morning and at bedtime. 5.9 g 0   dexamethasone (DECADRON) 4 MG tablet Take 1 tablet (4 mg total) by mouth 2 (two) times daily. 60 tablet 0   dextromethorphan-guaiFENesin (MUCINEX DM) 30-600 MG 12hr tablet Take 1 tablet by mouth 2 (two) times daily.     lidocaine-prilocaine (EMLA) cream Apply 1 application topically as needed. 30 g 0   loperamide (IMODIUM A-D) 2 MG tablet Take 2 mg by mouth as needed for diarrhea or loose stools.     ondansetron (ZOFRAN) 8 MG tablet Take 1 tablet (8 mg total) by mouth every 8 (eight) hours as needed for nausea or vomiting. (Patient not taking: Reported on 06/05/2021) 20 tablet 0   prochlorperazine (COMPAZINE) 10 MG tablet Take 1 tablet (10 mg total) by mouth every 6 (six) hours as needed for nausea or vomiting. (Patient not taking: Reported on 06/05/2021) 30 tablet 0   No current facility-administered medications for this visit.      Marland Kitchen  PHYSICAL EXAMINATION: ECOG PERFORMANCE STATUS: 1 - Symptomatic but completely ambulatory  Vitals:   06/05/21 0858  BP: 123/63  Pulse: 98  Resp: 14  Temp: (!) 96.5 F (35.8 C)  SpO2: 97%   Filed Weights   06/05/21 0858  Weight: 103 lb 11.6 oz (47 kg)    Physical Exam Vitals and nursing note reviewed.  Constitutional:      Comments: Ambulating: In a wheelchair.  Accompanied: with husband.   HENT:     Head: Normocephalic and atraumatic.     Mouth/Throat:     Pharynx: Oropharynx is clear.  Eyes:     Extraocular Movements: Extraocular movements intact.     Pupils: Pupils are equal, round, and reactive to light.  Cardiovascular:     Rate and Rhythm: Normal rate and regular  rhythm.  Pulmonary:     Comments: Decreased breath sounds bilaterally.  Abdominal:     Palpations: Abdomen is soft.  Musculoskeletal:        General: Normal range of motion.     Cervical back: Normal range of motion.  Skin:    General: Skin is warm.  Neurological:     General: No focal deficit present.     Mental Status: She is alert and oriented to person, place, and time.  Psychiatric:        Behavior: Behavior normal.        Judgment: Judgment normal.    LABORATORY DATA:  I have reviewed the data as listed Lab Results  Component Value Date   WBC 6.0 06/05/2021   HGB 12.9 06/05/2021   HCT 36.8 06/05/2021   MCV  90.9 06/05/2021   PLT 162 06/05/2021   Recent Labs    05/01/21 1147 05/15/21 0907 05/23/21 1355 05/31/21 1347 06/05/21 0839 06/05/21 0946  NA 132* 132* 129* 130* 129*  --   K 3.5 4.4 4.2 4.1 4.4  --   CL 95* 99 96* 99 96*  --   CO2 27 25 24  20* 22  --   GLUCOSE 128* 131* 122* 148* 260*  --   BUN 9 26* 24* 24* 37*  --   CREATININE 0.55 0.57 0.49 0.61 0.64  --   CALCIUM 8.9 8.5* 8.4* 8.3* 8.8*  --   GFRNONAA >60 >60 >60 >60 >60  --   PROT 8.2* 7.3  --   --   --  6.4*  ALBUMIN 3.1* 3.1*  --   --   --  3.1*  AST 28 18  --   --   --  25  ALT 29 23  --   --   --  38  ALKPHOS 117 78  --   --   --  57  BILITOT 0.7 0.7  --   --   --  0.5  BILIDIR  --   --   --   --   --  <0.1  IBILI  --   --   --   --   --  NOT CALCULATED    RADIOGRAPHIC STUDIES: I have personally reviewed the radiological images as listed and agreed with the findings in the report. IR IMAGING GUIDED PORT INSERTION  Result Date: 05/11/2021 INDICATION: 65 year old female referred for port catheter EXAM: IMAGE GUIDED PORT CATHETER MEDICATIONS: None ANESTHESIA/SEDATION: Moderate (conscious) sedation was employed during this procedure. A total of Versed 0.5 mg and Fentanyl 50 mcg was administered intravenously. Moderate Sedation Time: 20 minutes. The patient's level of consciousness and vital  signs were monitored continuously by radiology nursing throughout the procedure under my direct supervision. FLUOROSCOPY TIME:  Fluoroscopy Time: 0 minutes 12 seconds (0.53 mGy). COMPLICATIONS: None PROCEDURE: The procedure, risks, benefits, and alternatives were explained to the patient. Questions regarding the procedure were encouraged and answered. The patient understands and consents to the procedure. Ultrasound survey was performed with images stored and sent to PACs. The right neck and chest was prepped with chlorhexidine, and draped in the usual sterile fashion using maximum barrier technique (cap and mask, sterile gown, sterile gloves, large sterile sheet, hand hygiene and cutaneous antiseptic). Local anesthesia was attained by infiltration with 1% lidocaine without epinephrine. Ultrasound demonstrated patency of the right internal jugular vein, and this was documented with an image. Under real-time ultrasound guidance, this vein was accessed with a 21 gauge micropuncture needle and image documentation was performed. A small dermatotomy was made at the access site with an 11 scalpel. A 0.018" wire was advanced into the SVC and used to estimate the length of the internal catheter. The access needle exchanged for a 43F micropuncture vascular sheath. The 0.018" wire was then removed and a 0.035" wire advanced into the IVC. An appropriate location for the subcutaneous reservoir was selected below the clavicle and an incision was made through the skin and underlying soft tissues. The subcutaneous tissues were then dissected using a combination of blunt and sharp surgical technique and a pocket was formed. A single lumen power injectable portacatheter was then tunneled through the subcutaneous tissues from the pocket to the dermatotomy and the port reservoir placed within the subcutaneous pocket. The venous access site was then serially dilated and  a peel away vascular sheath placed over the wire. The wire was  removed and the port catheter advanced into position under fluoroscopic guidance. The catheter tip is positioned in the cavoatrial junction. This was documented with a spot image. The portacatheter was then tested and found to flush and aspirate well. The port was flushed with saline followed by 100 units/mL heparinized saline. The pocket was then closed in two layers using first subdermal inverted interrupted absorbable sutures followed by a running subcuticular suture. The epidermis was then sealed with Dermabond. The dermatotomy at the venous access site was also seal with Dermabond. Patient tolerated the procedure well and remained hemodynamically stable throughout. No complications encountered and no significant blood loss encountered IMPRESSION: Status post right IJ port catheter. Signed, Dulcy Fanny. Dellia Nims, RPVI [facility] Radiology Electronically Signed   By: Corrie Mckusick D.O.   On: 05/11/2021 10:24    ASSESSMENT & PLAN:   Primary cancer of right lower lobe of lung ([place]) #Stage IV/extensive stage small cell lung cancer-with metastasis to the right hilum/supraclavicular/subpectoral/axillary adenopathy; adrenal metastases and pelvic adenopathy.  June 2022 brain MRI-metastasis. S/p  carboplatin etoposide #1-day 1 [because of ongoing whole brain radiation-concern for toxicity; see below].  # Proceed with cycle #2 of carbo-etop-Tecentriq Labs today reviewed;  acceptable for treatment today.   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 ]. Taper Dex to 1/2 pill in AM; 1/2 pill in PM.   # Diarrhea/constipation: [ last BM- 2 days]- STABLE.   # COPD [quit smoking] continue albuterol inhaler; will need to add subsequent inhalers.  # hyponatremia- 129; dehydration- plan IVFs; discussed re: free water restriction.  # port malfunction: dye study; ok to proceed with chemo PIV  # DISPOSITION # dye  study # chemo PIV today # 1 week- lab- cbc/bmp; IVFs 1 hour # 2 week- lab- cbc/bmp; IVFs 1 hour # follow up in 3 weeks; MD labs- cbc/cmp; day-1 carbo-etop-Atezo; d-2 &3- etop- Dr.B  All questions were answered. The patient knows to call the clinic with any problems, questions or concerns.    Cammie Sickle, MD 06/05/2021 3:43 PM

## 2021-06-05 NOTE — Assessment & Plan Note (Addendum)
#  Stage IV/extensive stage small cell lung cancer-with metastasis to the right hilum/supraclavicular/subpectoral/axillary adenopathy; adrenal metastases and pelvic adenopathy.  June 2022 brain MRI-metastasis. S/p  carboplatin etoposide #1-day 1 [because of ongoing whole brain radiation-concern for toxicity; see below].  # Proceed with cycle #2 of carbo-etop-Tecentriq Labs today reviewed;  acceptable for treatment today.   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 ]. Taper Dex to 1/2 pill in AM; 1/2 pill in PM.   # Diarrhea/constipation: [ last BM- 2 days]- STABLE.   # COPD [quit smoking] continue albuterol inhaler; will need to add subsequent inhalers.  # hyponatremia- 129; dehydration- plan IVFs; discussed re: free water restriction.  # port malfunction: dye study; ok to proceed with chemo PIV  # DISPOSITION # dye study # chemo PIV today # 1 week- lab- cbc/bmp; IVFs 1 hour # 2 week- lab- cbc/bmp; IVFs 1 hour # follow up in 3 weeks; MD labs- cbc/cmp; day-1 carbo-etop-Atezo; d-2 &3- etop- Dr.B

## 2021-06-05 NOTE — Progress Notes (Signed)
Last visit with Dr. Donella Stade pt stated her rt ear was clogged up and was informed to take a  decogestant once a day but it has not been helping for the past two weeks they have tried sudafed but does not seem to be helping.

## 2021-06-06 ENCOUNTER — Inpatient Hospital Stay: Payer: 59

## 2021-06-06 ENCOUNTER — Ambulatory Visit
Admission: RE | Admit: 2021-06-06 | Discharge: 2021-06-06 | Disposition: A | Discharge status: Home / Self Care | Payer: 59 | Source: Ambulatory Visit | Attending: Internal Medicine | Admitting: Internal Medicine

## 2021-06-06 VITALS — BP 154/76 | HR 81 | Temp 96.4°F | Resp 16

## 2021-06-06 DIAGNOSIS — Z452 Encounter for adjustment and management of vascular access device: Secondary | ICD-10-CM | POA: Diagnosis present

## 2021-06-06 DIAGNOSIS — C3431 Malignant neoplasm of lower lobe, right bronchus or lung: Secondary | ICD-10-CM | POA: Diagnosis present

## 2021-06-06 DIAGNOSIS — Z5112 Encounter for antineoplastic immunotherapy: Secondary | ICD-10-CM | POA: Diagnosis not present

## 2021-06-06 MED ORDER — HEPARIN SOD (PORK) LOCK FLUSH 100 UNIT/ML IV SOLN
500.0000 [IU] | Freq: Once | INTRAVENOUS | Status: AC | PRN
Start: 2021-06-06 — End: 2021-06-06
  Administered 2021-06-06: 500 [IU]
  Filled 2021-06-06: qty 5

## 2021-06-06 MED ORDER — DEXAMETHASONE SODIUM PHOSPHATE 10 MG/ML IJ SOLN
10.0000 mg | Freq: Once | INTRAMUSCULAR | Status: AC
  Administered 2021-06-06: 10 mg via INTRAVENOUS

## 2021-06-06 MED ORDER — SODIUM CHLORIDE 0.9 % IV SOLN
Freq: Once | INTRAVENOUS | Status: AC
Start: 2021-06-06 — End: 2021-06-06
  Filled 2021-06-06: qty 250

## 2021-06-06 MED ORDER — IOHEXOL 300 MG/ML  SOLN
25.0000 mL | Freq: Once | INTRAMUSCULAR | Status: AC | PRN
  Administered 2021-06-06: 5 mL

## 2021-06-06 MED ORDER — SODIUM CHLORIDE 0.9% FLUSH
10.0000 mL | INTRAVENOUS | Status: DC | PRN
  Administered 2021-06-06: 10 mL
  Filled 2021-06-06: qty 10

## 2021-06-06 MED ORDER — SODIUM CHLORIDE 0.9 % IV SOLN
10.0000 mg | Freq: Once | INTRAVENOUS | Status: DC
  Filled 2021-06-06: qty 1

## 2021-06-06 MED ORDER — SODIUM CHLORIDE 0.9 % IV SOLN
100.0000 mg/m2 | Freq: Once | INTRAVENOUS | Status: AC
  Administered 2021-06-06: 140 mg via INTRAVENOUS
  Filled 2021-06-06: qty 7

## 2021-06-07 ENCOUNTER — Inpatient Hospital Stay: Payer: 59

## 2021-06-07 ENCOUNTER — Other Ambulatory Visit: Payer: Self-pay

## 2021-06-07 VITALS — BP 132/77 | HR 80 | Temp 96.7°F | Resp 18

## 2021-06-07 DIAGNOSIS — Z5112 Encounter for antineoplastic immunotherapy: Secondary | ICD-10-CM | POA: Diagnosis not present

## 2021-06-07 DIAGNOSIS — C3431 Malignant neoplasm of lower lobe, right bronchus or lung: Secondary | ICD-10-CM

## 2021-06-07 MED ORDER — SODIUM CHLORIDE 0.9 % IV SOLN
Freq: Once | INTRAVENOUS | Status: AC
  Filled 2021-06-07: qty 250

## 2021-06-07 MED ORDER — SODIUM CHLORIDE 0.9 % IV SOLN
10.0000 mg | Freq: Once | INTRAVENOUS | Status: AC
  Administered 2021-06-07: 10 mg via INTRAVENOUS
  Filled 2021-06-07: qty 1

## 2021-06-07 MED ORDER — SODIUM CHLORIDE 0.9 % IV SOLN
100.0000 mg/m2 | Freq: Once | INTRAVENOUS | Status: AC
  Administered 2021-06-07: 140 mg via INTRAVENOUS
  Filled 2021-06-07: qty 7

## 2021-06-07 MED ORDER — HEPARIN SOD (PORK) LOCK FLUSH 100 UNIT/ML IV SOLN
500.0000 [IU] | Freq: Once | INTRAVENOUS | Status: AC | PRN
  Administered 2021-06-07: 500 [IU]
  Filled 2021-06-07: qty 5

## 2021-06-07 NOTE — Patient Instructions (Signed)
[facility] [place] [facility] [place]  Discharge Instructions: Thank you for choosing [facility] to provide your oncology and hematology care.  If you have a lab appointment with the Cancer Center, please go directly to the Cancer Center and check in at the registration area.  Wear comfortable clothing and clothing appropriate for easy access to any Portacath or PICC line.   We strive to give you quality time with your provider. You may need to reschedule your appointment if you arrive late (15 or more minutes).  Arriving late affects you and other patients whose appointments are after yours.  Also, if you miss three or more appointments without notifying the office, you may be dismissed from the clinic at the provider's discretion.      For prescription refill requests, have your pharmacy contact our office and allow 72 hours for refills to be completed.    Today you received the following chemotherapy and/or immunotherapy agents       To help prevent nausea and vomiting after your treatment, we encourage you to take your nausea medication as directed.  BELOW ARE SYMPTOMS THAT SHOULD BE REPORTED IMMEDIATELY: *FEVER GREATER THAN 100.4 F (38 C) OR HIGHER *CHILLS OR SWEATING *NAUSEA AND VOMITING THAT IS NOT CONTROLLED WITH YOUR NAUSEA MEDICATION *UNUSUAL SHORTNESS OF BREATH *UNUSUAL BRUISING OR BLEEDING *URINARY PROBLEMS (pain or burning when urinating, or frequent urination) *BOWEL PROBLEMS (unusual diarrhea, constipation, pain near the anus) TENDERNESS IN MOUTH AND THROAT WITH OR WITHOUT PRESENCE OF ULCERS (sore throat, sores in mouth, or a toothache) UNUSUAL RASH, SWELLING OR PAIN  UNUSUAL VAGINAL DISCHARGE OR ITCHING   Items with * indicate a potential emergency and should be followed up as soon as possible or go to the Emergency Department if any problems should occur.  Please show the CHEMOTHERAPY ALERT CARD or IMMUNOTHERAPY ALERT CARD at check-in to the Emergency  Department and triage nurse.  Should you have questions after your visit or need to cancel or reschedule your appointment, please contact [facility] [place] [facility]  [phone] and follow the prompts.  Office hours are 8:00 a.m. to 4:30 p.m. Monday - Friday. Please note that voicemails left after 4:00 p.m. may not be returned until the following business day.  We are closed weekends and major holidays. You have access to a nurse at all times for urgent questions. Please call the main number to the clinic [phone] and follow the prompts.  For any non-urgent questions, you may also contact your provider using MyChart. We now offer e-Visits for anyone 18 and older to request care online for non-urgent symptoms. For details visit [email or web address].   Also download the MyChart app! Go to the app store, search "MyChart", open the app, select [facility], and log in with your MyChart username and password.  Due to Covid, a mask is required upon entering the hospital/clinic. If you do not have a mask, one will be given to you upon arrival. For doctor visits, patients may have 1 support person aged 18 or older with them. For treatment visits, patients cannot have anyone with them due to current Covid guidelines and our immunocompromised population.  

## 2021-06-12 ENCOUNTER — Inpatient Hospital Stay: Payer: 59

## 2021-06-12 ENCOUNTER — Inpatient Hospital Stay: Payer: 59 | Attending: Internal Medicine

## 2021-06-12 ENCOUNTER — Telehealth: Payer: Self-pay | Admitting: *Deleted

## 2021-06-12 ENCOUNTER — Other Ambulatory Visit: Payer: Self-pay

## 2021-06-12 VITALS — BP 106/68 | HR 97 | Temp 96.7°F | Resp 18

## 2021-06-12 DIAGNOSIS — C7931 Secondary malignant neoplasm of brain: Secondary | ICD-10-CM | POA: Insufficient documentation

## 2021-06-12 DIAGNOSIS — Z87891 Personal history of nicotine dependence: Secondary | ICD-10-CM | POA: Diagnosis present

## 2021-06-12 DIAGNOSIS — E876 Hypokalemia: Secondary | ICD-10-CM | POA: Diagnosis not present

## 2021-06-12 DIAGNOSIS — Z452 Encounter for adjustment and management of vascular access device: Secondary | ICD-10-CM | POA: Diagnosis not present

## 2021-06-12 DIAGNOSIS — R918 Other nonspecific abnormal finding of lung field: Secondary | ICD-10-CM | POA: Insufficient documentation

## 2021-06-12 DIAGNOSIS — J449 Chronic obstructive pulmonary disease, unspecified: Secondary | ICD-10-CM | POA: Diagnosis present

## 2021-06-12 DIAGNOSIS — L03811 Cellulitis of head [any part, except face]: Secondary | ICD-10-CM | POA: Diagnosis not present

## 2021-06-12 DIAGNOSIS — R630 Anorexia: Secondary | ICD-10-CM | POA: Diagnosis not present

## 2021-06-12 DIAGNOSIS — Z79899 Other long term (current) drug therapy: Secondary | ICD-10-CM | POA: Insufficient documentation

## 2021-06-12 DIAGNOSIS — C3490 Malignant neoplasm of unspecified part of unspecified bronchus or lung: Secondary | ICD-10-CM

## 2021-06-12 DIAGNOSIS — Z7901 Long term (current) use of anticoagulants: Secondary | ICD-10-CM | POA: Insufficient documentation

## 2021-06-12 DIAGNOSIS — Z86718 Personal history of other venous thrombosis and embolism: Secondary | ICD-10-CM | POA: Diagnosis not present

## 2021-06-12 DIAGNOSIS — A4152 Sepsis due to Pseudomonas: Secondary | ICD-10-CM | POA: Insufficient documentation

## 2021-06-12 DIAGNOSIS — C3431 Malignant neoplasm of lower lobe, right bronchus or lung: Secondary | ICD-10-CM | POA: Insufficient documentation

## 2021-06-12 DIAGNOSIS — R634 Abnormal weight loss: Secondary | ICD-10-CM | POA: Insufficient documentation

## 2021-06-12 LAB — CBC WITH DIFFERENTIAL/PLATELET
Abs Immature Granulocytes: 0.03 10*3/uL (ref 0.00–0.07)
Basophils Absolute: 0 10*3/uL (ref 0.0–0.1)
Basophils Relative: 1 %
Eosinophils Absolute: 0 10*3/uL (ref 0.0–0.5)
Eosinophils Relative: 0 %
HCT: 32.1 % — ABNORMAL LOW (ref 36.0–46.0)
Hemoglobin: 11.4 g/dL — ABNORMAL LOW (ref 12.0–15.0)
Immature Granulocytes: 3 %
Lymphocytes Relative: 34 %
Lymphs Abs: 0.4 10*3/uL — ABNORMAL LOW (ref 0.7–4.0)
MCH: 31.5 pg (ref 26.0–34.0)
MCHC: 35.5 g/dL (ref 30.0–36.0)
MCV: 88.7 fL (ref 80.0–100.0)
Monocytes Absolute: 0 10*3/uL — ABNORMAL LOW (ref 0.1–1.0)
Monocytes Relative: 2 %
Neutro Abs: 0.7 10*3/uL — ABNORMAL LOW (ref 1.7–7.7)
Neutrophils Relative %: 60 %
Platelets: 109 10*3/uL — ABNORMAL LOW (ref 150–400)
RBC: 3.62 MIL/uL — ABNORMAL LOW (ref 3.87–5.11)
RDW: 13.4 % (ref 11.5–15.5)
WBC: 1.2 10*3/uL — CL (ref 4.0–10.5)
nRBC: 0 % (ref 0.0–0.2)

## 2021-06-12 LAB — COMPREHENSIVE METABOLIC PANEL
ALT: 26 U/L (ref 0–44)
AST: 27 U/L (ref 15–41)
Albumin: 2.8 g/dL — ABNORMAL LOW (ref 3.5–5.0)
Alkaline Phosphatase: 63 U/L (ref 38–126)
Anion gap: 8 (ref 5–15)
BUN: 27 mg/dL — ABNORMAL HIGH (ref 8–23)
CO2: 24 mmol/L (ref 22–32)
Calcium: 8 mg/dL — ABNORMAL LOW (ref 8.9–10.3)
Chloride: 96 mmol/L — ABNORMAL LOW (ref 98–111)
Creatinine, Ser: 0.46 mg/dL (ref 0.44–1.00)
GFR, Estimated: >60 mL/min (ref >60)
Glucose, Bld: 169 mg/dL — ABNORMAL HIGH (ref 70–99)
Potassium: 3.4 mmol/L — ABNORMAL LOW (ref 3.5–5.1)
Sodium: 128 mmol/L — ABNORMAL LOW (ref 135–145)
Total Bilirubin: 0.6 mg/dL (ref 0.3–1.2)
Total Protein: 6.1 g/dL — ABNORMAL LOW (ref 6.5–8.1)

## 2021-06-12 MED ORDER — SODIUM CHLORIDE 0.9 % IV SOLN
Freq: Once | INTRAVENOUS | Status: AC
  Filled 2021-06-12: qty 250

## 2021-06-12 MED ORDER — HEPARIN SOD (PORK) LOCK FLUSH 100 UNIT/ML IV SOLN
500.0000 [IU] | Freq: Once | INTRAVENOUS | Status: AC
  Administered 2021-06-12: 500 [IU] via INTRAVENOUS
  Filled 2021-06-12: qty 5

## 2021-06-12 MED ORDER — ALTEPLASE 2 MG IJ SOLR
2.0000 mg | Freq: Once | INTRAMUSCULAR | Status: AC | PRN
  Administered 2021-06-12: 2 mg
  Filled 2021-06-12: qty 2

## 2021-06-12 NOTE — Telephone Encounter (Signed)
Critical WBC of 1.2 / ANC 0.7 1431-called by Katharine Look in [place] lab dept. Read  back process performed with lab tech.  Dr. Rogue Bussing informed at 1440- read back process performed with md.

## 2021-06-14 ENCOUNTER — Telehealth: Payer: Self-pay

## 2021-06-14 NOTE — Telephone Encounter (Signed)
Nutrition  Patient scheduled for nutrition follow-up on 8/29.  RD needing to move appointment to 8/18 during infusion.  RD called patient and left message explaining this appointment adjustment. Call back number left if patient has questions.   Estefanie Cornforth B. Zenia Resides, [place], [place] Registered Dietitian [phone] (mobile)

## 2021-06-17 ENCOUNTER — Emergency Department: Payer: 59

## 2021-06-17 ENCOUNTER — Encounter: Payer: Self-pay | Admitting: Emergency Medicine

## 2021-06-17 ENCOUNTER — Encounter: Payer: Self-pay | Admitting: Internal Medicine

## 2021-06-17 ENCOUNTER — Other Ambulatory Visit: Payer: Self-pay

## 2021-06-17 ENCOUNTER — Inpatient Hospital Stay
Admission: EM | Admit: 2021-06-17 | Discharge: 2021-06-21 | DRG: 872 | Disposition: A | Discharge status: Home / Self Care | Payer: 59 | Attending: Internal Medicine | Admitting: Internal Medicine

## 2021-06-17 DIAGNOSIS — L03213 Periorbital cellulitis: Secondary | ICD-10-CM | POA: Diagnosis present

## 2021-06-17 DIAGNOSIS — E876 Hypokalemia: Secondary | ICD-10-CM | POA: Diagnosis present

## 2021-06-17 DIAGNOSIS — T451X5A Adverse effect of antineoplastic and immunosuppressive drugs, initial encounter: Secondary | ICD-10-CM | POA: Diagnosis present

## 2021-06-17 DIAGNOSIS — C3432 Malignant neoplasm of lower lobe, left bronchus or lung: Secondary | ICD-10-CM | POA: Diagnosis present

## 2021-06-17 DIAGNOSIS — E222 Syndrome of inappropriate secretion of antidiuretic hormone: Secondary | ICD-10-CM | POA: Diagnosis present

## 2021-06-17 DIAGNOSIS — I82611 Acute embolism and thrombosis of superficial veins of right upper extremity: Secondary | ICD-10-CM | POA: Diagnosis present

## 2021-06-17 DIAGNOSIS — Z95828 Presence of other vascular implants and grafts: Secondary | ICD-10-CM

## 2021-06-17 DIAGNOSIS — H1089 Other conjunctivitis: Secondary | ICD-10-CM | POA: Diagnosis present

## 2021-06-17 DIAGNOSIS — D701 Agranulocytosis secondary to cancer chemotherapy: Secondary | ICD-10-CM | POA: Diagnosis present

## 2021-06-17 DIAGNOSIS — D61810 Antineoplastic chemotherapy induced pancytopenia: Secondary | ICD-10-CM | POA: Diagnosis present

## 2021-06-17 DIAGNOSIS — C3431 Malignant neoplasm of lower lobe, right bronchus or lung: Secondary | ICD-10-CM | POA: Diagnosis present

## 2021-06-17 DIAGNOSIS — L88 Pyoderma gangrenosum: Secondary | ICD-10-CM | POA: Diagnosis present

## 2021-06-17 DIAGNOSIS — Z7951 Long term (current) use of inhaled steroids: Secondary | ICD-10-CM | POA: Diagnosis not present

## 2021-06-17 DIAGNOSIS — A4152 Sepsis due to Pseudomonas: Secondary | ICD-10-CM | POA: Diagnosis not present

## 2021-06-17 DIAGNOSIS — Z79899 Other long term (current) drug therapy: Secondary | ICD-10-CM

## 2021-06-17 DIAGNOSIS — H01003 Unspecified blepharitis right eye, unspecified eyelid: Secondary | ICD-10-CM | POA: Diagnosis present

## 2021-06-17 DIAGNOSIS — Z87891 Personal history of nicotine dependence: Secondary | ICD-10-CM

## 2021-06-17 DIAGNOSIS — I8290 Acute embolism and thrombosis of unspecified vein: Secondary | ICD-10-CM

## 2021-06-17 DIAGNOSIS — Z20822 Contact with and (suspected) exposure to covid-19: Secondary | ICD-10-CM | POA: Diagnosis present

## 2021-06-17 DIAGNOSIS — Z923 Personal history of irradiation: Secondary | ICD-10-CM

## 2021-06-17 DIAGNOSIS — E860 Dehydration: Secondary | ICD-10-CM | POA: Diagnosis present

## 2021-06-17 DIAGNOSIS — I82C11 Acute embolism and thrombosis of right internal jugular vein: Secondary | ICD-10-CM | POA: Diagnosis present

## 2021-06-17 DIAGNOSIS — D6959 Other secondary thrombocytopenia: Secondary | ICD-10-CM | POA: Diagnosis present

## 2021-06-17 DIAGNOSIS — D709 Neutropenia, unspecified: Secondary | ICD-10-CM | POA: Diagnosis not present

## 2021-06-17 DIAGNOSIS — C7931 Secondary malignant neoplasm of brain: Secondary | ICD-10-CM | POA: Diagnosis present

## 2021-06-17 DIAGNOSIS — E871 Hypo-osmolality and hyponatremia: Secondary | ICD-10-CM | POA: Diagnosis present

## 2021-06-17 DIAGNOSIS — I82409 Acute embolism and thrombosis of unspecified deep veins of unspecified lower extremity: Secondary | ICD-10-CM | POA: Diagnosis present

## 2021-06-17 DIAGNOSIS — R5081 Fever presenting with conditions classified elsewhere: Secondary | ICD-10-CM | POA: Diagnosis present

## 2021-06-17 DIAGNOSIS — R7881 Bacteremia: Secondary | ICD-10-CM | POA: Diagnosis not present

## 2021-06-17 DIAGNOSIS — H01006 Unspecified blepharitis left eye, unspecified eyelid: Secondary | ICD-10-CM | POA: Diagnosis present

## 2021-06-17 DIAGNOSIS — A419 Sepsis, unspecified organism: Secondary | ICD-10-CM | POA: Diagnosis not present

## 2021-06-17 DIAGNOSIS — B965 Pseudomonas (aeruginosa) (mallei) (pseudomallei) as the cause of diseases classified elsewhere: Secondary | ICD-10-CM | POA: Diagnosis not present

## 2021-06-17 DIAGNOSIS — R509 Fever, unspecified: Secondary | ICD-10-CM | POA: Diagnosis not present

## 2021-06-17 LAB — CBC WITH DIFFERENTIAL/PLATELET
Abs Immature Granulocytes: 0.21 10*3/uL — ABNORMAL HIGH (ref 0.00–0.07)
Basophils Absolute: 0 10*3/uL (ref 0.0–0.1)
Basophils Relative: 3 %
Eosinophils Absolute: 0 10*3/uL (ref 0.0–0.5)
Eosinophils Relative: 0 %
HCT: 27.1 % — ABNORMAL LOW (ref 36.0–46.0)
Hemoglobin: 9.7 g/dL — ABNORMAL LOW (ref 12.0–15.0)
Immature Granulocytes: 27 %
Lymphocytes Relative: 17 %
Lymphs Abs: 0.1 10*3/uL — ABNORMAL LOW (ref 0.7–4.0)
MCH: 31.8 pg (ref 26.0–34.0)
MCHC: 35.8 g/dL (ref 30.0–36.0)
MCV: 88.9 fL (ref 80.0–100.0)
Monocytes Absolute: 0.1 10*3/uL (ref 0.1–1.0)
Monocytes Relative: 8 %
Neutro Abs: 0.4 10*3/uL — CL (ref 1.7–7.7)
Neutrophils Relative %: 45 %
Platelets: 81 10*3/uL — ABNORMAL LOW (ref 150–400)
RBC: 3.05 MIL/uL — ABNORMAL LOW (ref 3.87–5.11)
RDW: 13.6 % (ref 11.5–15.5)
Smear Review: NORMAL
WBC: 0.8 10*3/uL — CL (ref 4.0–10.5)
nRBC: 2.5 % — ABNORMAL HIGH (ref 0.0–0.2)

## 2021-06-17 LAB — COMPREHENSIVE METABOLIC PANEL
ALT: 63 U/L — ABNORMAL HIGH (ref 0–44)
AST: 32 U/L (ref 15–41)
Albumin: 2.4 g/dL — ABNORMAL LOW (ref 3.5–5.0)
Alkaline Phosphatase: 134 U/L — ABNORMAL HIGH (ref 38–126)
Anion gap: 13 (ref 5–15)
BUN: 12 mg/dL (ref 8–23)
CO2: 25 mmol/L (ref 22–32)
Calcium: 8.5 mg/dL — ABNORMAL LOW (ref 8.9–10.3)
Chloride: 88 mmol/L — ABNORMAL LOW (ref 98–111)
Creatinine, Ser: 0.61 mg/dL (ref 0.44–1.00)
GFR, Estimated: >60 mL/min (ref >60)
Glucose, Bld: 212 mg/dL — ABNORMAL HIGH (ref 70–99)
Potassium: 2.7 mmol/L — CL (ref 3.5–5.1)
Sodium: 126 mmol/L — ABNORMAL LOW (ref 135–145)
Total Bilirubin: 0.9 mg/dL (ref 0.3–1.2)
Total Protein: 7.4 g/dL (ref 6.5–8.1)

## 2021-06-17 LAB — BLOOD GAS, VENOUS
Acid-Base Excess: 3.2 mmol/L — ABNORMAL HIGH (ref 0.0–2.0)
Bicarbonate: 26.8 mmol/L (ref 20.0–28.0)
O2 Saturation: 28.8 %
Patient temperature: 37
pCO2, Ven: 36 mmHg — ABNORMAL LOW (ref 44.0–60.0)
pH, Ven: 7.48 — ABNORMAL HIGH (ref 7.250–7.430)
pO2, Ven: <31 mmHg — CL (ref 32.0–45.0)

## 2021-06-17 LAB — LACTIC ACID, PLASMA
Lactic Acid, Venous: 2.1 mmol/L (ref 0.5–1.9)
Lactic Acid, Venous: 2.1 mmol/L (ref 0.5–1.9)
Lactic Acid, Venous: 2.7 mmol/L (ref 0.5–1.9)
Lactic Acid, Venous: 1.6 mmol/L (ref 0.5–1.9)

## 2021-06-17 LAB — RESP PANEL BY RT-PCR (FLU A&B, COVID) ARPGX2
Influenza A by PCR: NEGATIVE
Influenza B by PCR: NEGATIVE
SARS Coronavirus 2 by RT PCR: NEGATIVE

## 2021-06-17 LAB — HIV ANTIBODY (ROUTINE TESTING W REFLEX): HIV Screen 4th Generation wRfx: NONREACTIVE

## 2021-06-17 MED ORDER — LACTATED RINGERS IV BOLUS
1000.0000 mL | Freq: Once | INTRAVENOUS | Status: AC
  Administered 2021-06-17: 1000 mL via INTRAVENOUS

## 2021-06-17 MED ORDER — DEXTROSE 5 % IV SOLN
10.0000 mg/kg | Freq: Once | INTRAVENOUS | Status: AC
  Administered 2021-06-17: 470 mg via INTRAVENOUS
  Filled 2021-06-17: qty 9.4

## 2021-06-17 MED ORDER — IOHEXOL 350 MG/ML SOLN
75.0000 mL | Freq: Once | INTRAVENOUS | Status: DC | PRN
  Filled 2021-06-17: qty 75

## 2021-06-17 MED ORDER — ONDANSETRON HCL 4 MG PO TABS
4.0000 mg | ORAL_TABLET | Freq: Four times a day (QID) | ORAL | Status: DC | PRN
  Administered 2021-06-17: 4 mg via ORAL
  Filled 2021-06-17: qty 1

## 2021-06-17 MED ORDER — SODIUM CHLORIDE 0.9 % IV SOLN
1.0000 g | Freq: Once | INTRAVENOUS | Status: AC
  Administered 2021-06-17: 1 g via INTRAVENOUS
  Filled 2021-06-17: qty 1

## 2021-06-17 MED ORDER — SODIUM CHLORIDE 0.9 % IV SOLN
2.0000 g | Freq: Two times a day (BID) | INTRAVENOUS | Status: DC
  Administered 2021-06-18 – 2021-06-21 (×7): 2 g via INTRAVENOUS
  Filled 2021-06-17 (×12): qty 2

## 2021-06-17 MED ORDER — UMECLIDINIUM-VILANTEROL 62.5-25 MCG/INH IN AEPB
1.0000 | INHALATION_SPRAY | Freq: Every day | RESPIRATORY_TRACT | Status: DC
  Filled 2021-06-17: qty 14

## 2021-06-17 MED ORDER — SODIUM CHLORIDE 0.9 % IV SOLN: 2.0000 g | Freq: Three times a day (TID) | INTRAVENOUS | Status: DC

## 2021-06-17 MED ORDER — ACETAMINOPHEN 650 MG RE SUPP: 650.0000 mg | Freq: Four times a day (QID) | RECTAL | Status: DC | PRN

## 2021-06-17 MED ORDER — POTASSIUM CHLORIDE 2 MEQ/ML IV SOLN
INTRAVENOUS | Status: DC
  Filled 2021-06-17 (×9): qty 1000

## 2021-06-17 MED ORDER — IOHEXOL 350 MG/ML SOLN
75.0000 mL | Freq: Once | INTRAVENOUS | Status: AC | PRN
  Administered 2021-06-17: 75 mL via INTRAVENOUS

## 2021-06-17 MED ORDER — POTASSIUM CHLORIDE 20 MEQ PO PACK
40.0000 meq | PACK | Freq: Every day | ORAL | Status: DC
  Administered 2021-06-17 – 2021-06-19 (×3): 40 meq via ORAL
  Filled 2021-06-17 (×3): qty 2

## 2021-06-17 MED ORDER — VANCOMYCIN HCL 750 MG/150ML IV SOLN
750.0000 mg | INTRAVENOUS | Status: DC
  Administered 2021-06-18: 750 mg via INTRAVENOUS
  Filled 2021-06-17 (×2): qty 150

## 2021-06-17 MED ORDER — BUDESONIDE 0.25 MG/2ML IN SUSP
0.2500 mg | Freq: Two times a day (BID) | RESPIRATORY_TRACT | Status: DC
  Administered 2021-06-18 – 2021-06-21 (×6): 0.25 mg via RESPIRATORY_TRACT
  Filled 2021-06-17 (×7): qty 2

## 2021-06-17 MED ORDER — ACETAMINOPHEN 325 MG PO TABS: 650.0000 mg | ORAL_TABLET | Freq: Four times a day (QID) | ORAL | Status: DC | PRN

## 2021-06-17 MED ORDER — BUDESON-GLYCOPYRROL-FORMOTEROL 160-9-4.8 MCG/ACT IN AERO: 2.0000 | INHALATION_SPRAY | Freq: Two times a day (BID) | RESPIRATORY_TRACT | Status: DC

## 2021-06-17 MED ORDER — DEXAMETHASONE 4 MG PO TABS
4.0000 mg | ORAL_TABLET | Freq: Two times a day (BID) | ORAL | Status: DC
  Administered 2021-06-17 – 2021-06-20 (×7): 4 mg via ORAL
  Filled 2021-06-17 (×10): qty 1

## 2021-06-17 MED ORDER — ONDANSETRON HCL 4 MG/2ML IJ SOLN: 4.0000 mg | Freq: Four times a day (QID) | INTRAMUSCULAR | Status: DC | PRN

## 2021-06-17 MED ORDER — SODIUM CHLORIDE 0.9 % IV BOLUS
1000.0000 mL | Freq: Once | INTRAVENOUS | Status: AC
  Administered 2021-06-17: 1000 mL via INTRAVENOUS

## 2021-06-17 MED ORDER — FLUORESCEIN SODIUM 1 MG OP STRP
1.0000 | ORAL_STRIP | Freq: Once | OPHTHALMIC | Status: AC
  Administered 2021-06-17: 1 via OPHTHALMIC
  Filled 2021-06-17: qty 1

## 2021-06-17 MED ORDER — DM-GUAIFENESIN ER 30-600 MG PO TB12
1.0000 | ORAL_TABLET | Freq: Two times a day (BID) | ORAL | Status: DC
  Administered 2021-06-17 – 2021-06-21 (×7): 1 via ORAL
  Filled 2021-06-17 (×9): qty 1

## 2021-06-17 MED ORDER — VANCOMYCIN HCL IN DEXTROSE 1-5 GM/200ML-% IV SOLN
1000.0000 mg | Freq: Once | INTRAVENOUS | Status: AC
  Administered 2021-06-17: 1000 mg via INTRAVENOUS
  Filled 2021-06-17: qty 200

## 2021-06-17 MED ORDER — TETRACAINE HCL 0.5 % OP SOLN
2.0000 [drp] | Freq: Once | OPHTHALMIC | Status: AC
  Administered 2021-06-17: 2 [drp] via OPHTHALMIC
  Filled 2021-06-17: qty 4

## 2021-06-17 MED ORDER — LACTATED RINGERS IV SOLN: INTRAVENOUS | Status: DC

## 2021-06-17 NOTE — ED Notes (Signed)
Patient incontinent of bowel and bladder. Pt cleaned and linens changed

## 2021-06-17 NOTE — H&P (Signed)
History and Physical    Cathy Glover LPF:[id number] DOB: 06/02/1956 DOA: 06/17/2021  PCP: Pcp, No   Patient coming from: Home  I have personally briefly reviewed patient's old medical records in [place]  Chief Complaint: Swelling involving bilateral eyelids  HPI: Cathy Glover is a 65 y.o. 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  Patient denies any trauma to her eyelids and denies having any pain in her eyes. Upon arrival to the ER she was noted to be groggy but had received Benadryl 25 mg x 1 dose due to concern for possible drug allergy. She has had diarrhea, 1 episode of emesis and poor oral intake but denies having any abdominal pain, no dysuria, no nocturia, no frequency, no dizziness, no lightheadedness, no headache, no chest pain, no shortness of breath, no focal deficits. Venous blood gas  7.48/36/<31/26.8/28.8 Labs show sodium 126, potassium 2.7, chloride 88, bicarb 25, glucose 212, BUN 12, creatinine 0.61, calcium 8.5, alkaline phosphatase 134, albumin 2.4, AST 32, ALT 63, lactic acid 2.1, white count 0.8, hemoglobin 9.7 down from 11.4, hematocrit 27.1, MCV 88.9, RDW 13.6, platelet count 81,000 Respiratory viral panel is negative Maxillofacial CT showed bilateral periorbital and upper lip soft tissue swelling. No evidence of postseptal cellulitis or abscess. Partially visualized subocclusive thrombus in the right internal jugular vein above a Port-A-Cath. Partially visualized left lower neck and left supraclavicular lymphadenopathy, likely improved from the prior PET-CT. Decreased right temporoparietal vasogenic edema. CXR shows irregular and nodular opacity projecting the  perihilar right lung and right lung base, in keeping with known primary lung malignancy. No new or acute appearing airspace disease. Twelve-lead EKG shows sinus tachycardia with left atrial enlargement.   ED Course: Patient is a 65 year old female with a history of stage IV lung cancer on chemotherapy who presents to the ER for evaluation of swelling, redness and increased drainage from both eyes. Patient has pancytopenia with an [place] of 528. Patient is tachycardic and tachypneic and has elevated lactic acid level of 2.1. She received empiric IV antibiotic therapy with cefepime and vancomycin and will be admitted to the hospital for sepsis from periorbital cellulitis.  Review of Systems: As per HPI otherwise all other systems reviewed and negative.    Past Medical History:  Diagnosis Date   Cancer [facility])    Pulmonary nodule     Past Surgical History:  Procedure Laterality Date   IR IMAGING GUIDED PORT INSERTION  05/11/2021   VIDEO BRONCHOSCOPY WITH ENDOBRONCHIAL ULTRASOUND N/A 04/25/2021   Procedure: VIDEO BRONCHOSCOPY WITH ENDOBRONCHIAL ULTRASOUND;  Surgeon: Tyler Pita, MD;  Location: [facility] ORS;  Service: Cardiopulmonary;  Laterality: N/A;     reports that she quit smoking about 2 months ago. Her smoking use included cigarettes. She has a 25.00 pack-year smoking history. She has never used smokeless tobacco. She reports that she does not drink alcohol and does not use drugs.  No Known Allergies  Family History  Problem Relation Age of Onset   Cancer Sister       Prior to Admission medications   Medication Sig Start Date End Date Taking? Authorizing Provider  albuterol (VENTOLIN HFA) 108 (90 Base) MCG/ACT inhaler Inhale 1-2 puffs into the lungs every 6 (  six) hours as needed for wheezing or shortness of breath. 05/01/21  Yes Cammie Sickle, MD  dexamethasone (DECADRON) 4 MG tablet Take 1 tablet (4 mg total) by mouth 2 (two) times daily. 06/04/21 07/04/21 Yes Cammie Sickle, MD  dextromethorphan-guaiFENesin [facility] DM) 30-600 MG 12hr tablet Take 1 tablet by mouth 2 (two) times daily.   Yes [provider]  lidocaine-prilocaine (EMLA) cream Apply 1 application topically as needed. 05/01/21  Yes Cammie Sickle, MD  Budeson-Glycopyrrol-Formoterol (BREZTRI AEROSPHERE) 160-9-4.8 MCG/ACT AERO Inhale 2 puffs into the lungs in the morning and at bedtime. 05/24/21   Tyler Pita, MD  loperamide (IMODIUM A-D) 2 MG tablet Take 2 mg by mouth as needed for diarrhea or loose stools.    [provider]  ondansetron (ZOFRAN) 8 MG tablet Take 1 tablet (8 mg total) by mouth every 8 (eight) hours as needed for nausea or vomiting. Patient not taking: No sig reported 05/01/21   Cammie Sickle, MD  prochlorperazine (COMPAZINE) 10 MG tablet Take 1 tablet (10 mg total) by mouth every 6 (six) hours as needed for nausea or vomiting. Patient not taking: No sig reported 05/01/21   Cammie Sickle, MD    Physical Exam: Vitals:   06/17/21 1105 06/17/21 1200 06/17/21 1215 06/17/21 1337  BP:   (!) 148/87 (!) 122/91  Pulse:  (!) 116 (!) 103 (!) 126  Resp:  (!) 25 (!) 27 (!) 27  Temp:      TempSrc:      SpO2:  100% 100% 98%  Weight: 47 kg        Vitals:   06/17/21 1105 06/17/21 1200 06/17/21 1215 06/17/21 1337  BP:   (!) 148/87 (!) 122/91  Pulse:  (!) 116 (!) 103 (!) 126  Resp:  (!) 25 (!) 27 (!) 27  Temp:      TempSrc:      SpO2:  100% 100% 98%  Weight: 47 kg         Constitutional: Alert and oriented x 3 . Peri orbital swelling with redness and increased drainage from both eyes (Lt > Rt) HEENT:      Head: Normocephalic and atraumatic.         Eyes: Periorbital swelling with redness and increased drainage from both eyes (Lt > Rt).       Mouth/Throat: Mucous membranes are moist.       Neck: Supple with no signs of meningismus. Cardiovascular: Tachycardia. No murmurs, gallops, or rubs. 2+ symmetrical distal pulses are present .  No JVD. No LE edema Respiratory: Respiratory effort normal .Lungs sounds clear bilaterally. No wheezes, crackles, or rhonchi.  Gastrointestinal: Soft, non tender, and non distended with positive bowel sounds.  Genitourinary: No CVA tenderness. Musculoskeletal: Nontender with normal range of motion in all extremities. No cyanosis, or erythema of extremities. Neurologic:  Face is symmetric. Moving all extremities. No gross focal neurologic deficits . Skin: Skin is warm, dry.  No rash or ulcers, petechiae involving the lower extremities Psychiatric: Mood and affect are normal    Labs on Admission: I have personally reviewed following labs and imaging studies  CBC: Recent Labs  Lab 06/12/21 1325 06/17/21 1104  WBC 1.2* 0.8*  NEUTROABS 0.7* 0.4*  HGB 11.4* 9.7*  HCT 32.1* 27.1*  MCV 88.7 88.9  PLT 109* 81*   Basic Metabolic Panel: Recent Labs  Lab 06/12/21 1325 06/17/21 1104  NA 128* 126*  K 3.4* 2.7*  CL 96* 88*  CO2  24 25  GLUCOSE 169* 212*  BUN 27* 12  CREATININE 0.46 0.61  CALCIUM 8.0* 8.5*   GFR: Estimated Creatinine Clearance: 47.8 mL/min (by C-G formula based on SCr of 0.61 mg/dL). Liver Function Tests: Recent Labs  Lab 06/12/21 1325 06/17/21 1104  AST 27 32  ALT 26 63*  ALKPHOS 63 134*  BILITOT 0.6 0.9  PROT 6.1* 7.4  ALBUMIN 2.8* 2.4*   No results for input(s): LIPASE, AMYLASE in the last 168 hours. No results for input(s): AMMONIA in the last 168 hours. Coagulation Profile: No results for input(s): INR, PROTIME in the last 168 hours. Cardiac Enzymes: No results for input(s): CKTOTAL, CKMB, CKMBINDEX, TROPONINI in the last 168 hours. BNP (last 3 results) No results for input(s): PROBNP in the last 8760 hours. HbA1C: No results for input(s): HGBA1C in the last 72 hours. CBG: No results for input(s): GLUCAP in the last 168 hours. Lipid Profile: No results for input(s): CHOL, HDL, LDLCALC, TRIG, CHOLHDL, LDLDIRECT in the last 72 hours. Thyroid  Function Tests: No results for input(s): TSH, T4TOTAL, FREET4, T3FREE, THYROIDAB in the last 72 hours. Anemia Panel: No results for input(s): VITAMINB12, FOLATE, FERRITIN, TIBC, IRON, RETICCTPCT in the last 72 hours. Urine analysis: No results found for: COLORURINE, APPEARANCEUR, LABSPEC, [place], GLUCOSEU, [place], BILIRUBINUR, KETONESUR, PROTEINUR, UROBILINOGEN, NITRITE, LEUKOCYTESUR  Radiological Exams on Admission: CT Maxillofacial W Contrast  Result Date: 06/17/2021 CLINICAL DATA:  Maxillofacial pain Swelling in the eyelids. History of metastatic lung cancer. EXAM: CT MAXILLOFACIAL WITH CONTRAST TECHNIQUE: Multidetector CT imaging of the maxillofacial structures was performed with intravenous contrast. Multiplanar CT image reconstructions were also generated. CONTRAST:  77mL OMNIPAQUE IOHEXOL 350 MG/ML SOLN COMPARISON:  Head MRI 05/04/2021.  PET-CT 05/03/2021. FINDINGS: Osseous: No acute fracture or destructive osseous process. Edentulous. Orbits: Moderate swelling/thickening of the preseptal soft tissues bilaterally, left greater than right. No fluid collection, postseptal inflammation, or intraorbital mass. Grossly intact globes. Sinuses: Small volume fluid in the left sphenoid sinus. Underdeveloped right mastoid air cells which are partially chronically opacified with persistent fluid in the right middle ear cavity as well. Clear left mastoid air cells. Soft tissues: Soft tissue swelling involving the upper lip. Partially visualized subocclusive thrombus in the right internal jugular vein in the mid and lower neck above a Port-A-Cath. Partially visualized lymphadenopathy in the left lower neck and left supraclavicular region with the largest node covered on today's study measuring 1.1 cm in short axis, overall smaller than on the prior PET-CT. Limited intracranial: Mild vasogenic edema in the right temporoparietal region, less than on the prior MRI and with the known underlying mass not well  demonstrated on this maxillofacial CT. IMPRESSION: 1. Bilateral periorbital and upper lip soft tissue swelling. No evidence of postseptal cellulitis or abscess. 2. Partially visualized subocclusive thrombus in the right internal jugular vein above a Port-A-Cath. 3. Partially visualized left lower neck and left supraclavicular lymphadenopathy, likely improved from the prior PET-CT. 4. Decreased right temporoparietal vasogenic edema. Electronically Signed   By: Logan Bores M.D.   On: 06/17/2021 13:04   DG Chest Portable 1 View  Result Date: 06/17/2021 CLINICAL DATA:  Sepsis, lung cancer, ongoing chemotherapy EXAM: PORTABLE CHEST 1 VIEW COMPARISON:  04/12/2021 FINDINGS: The heart size and mediastinal contours are within normal limits. Right chest port catheter. Irregular and nodular opacity projecting the perihilar right lung and right lung base, in keeping with known primary lung malignancy. No new or acute appearing airspace disease. The visualized skeletal structures are unremarkable. IMPRESSION: Irregular and  nodular opacity projecting the perihilar right lung and right lung base, in keeping with known primary lung malignancy. No new or acute appearing airspace disease. Electronically Signed   By: Eddie Candle M.D.   On: 06/17/2021 11:46     Assessment/Plan Principal Problem:   Sepsis ([place]) Active Problems:   Primary cancer of right lower lobe of lung (HCC)   Hyponatremia   Hypokalemia   Antineoplastic chemotherapy induced pancytopenia (HCC)   DVT (deep venous thrombosis) (HCC)   Cellulitis of periorbital region of both eyes     Sepsis from cellulitis of periorbital region of both eyes (POA) As evidenced by tachycardia, tachypnea, lactic acidosis and neutropenia with ANC of 528 Patient has a history of stage IV lung cancer and is status post recent chemotherapy Continue aggressive IV fluid resuscitation Place patient on empiric antibiotic therapy with vancomycin and cefepime Follow-up  results of blood cultures.    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  All questions and concerns have been addressed.  They verbalized understanding and agree with the plan.  CODE STATUS was discussed and she is a full code.  She lists her husband is her healthcare power of attorney. Disposition Plan: Back to previous home environment Consults called: Oncology  Status: At the time of admission, it appears that the appropriate admission status for this patient is inpatient. This is not necessarily to provide the required intensity of service to ensure the patient's safety given the presenting symptoms, physical exam findings, and initial medication patient will be treated in the context of their comorbid conditions. Patient requires inpatient status due to high intensity of service, high risk for further deterioration and high frequency of  surveillance required.    Wyatte Dames MD [facility]     06/17/2021, 3:00 PM

## 2021-06-17 NOTE — ED Triage Notes (Signed)
Pt in from home via [facility] with swelling to bilateral eyes, worsened x 2 days, and sore throat. Stg 4 CA pt, and had last chemo trx 3 days ago. Oral temp 98.1, denies any cp or sob. Family reports incr grogginess, but she did have 25mg  Benadryl PTA. GCS 13.

## 2021-06-17 NOTE — Consult Note (Signed)
Pharmacy Antibiotic Note  Cathy Glover is a 65 y.o. female admitted on 06/17/2021 with bilateral eye swelling, sore throat, and lethargy, and cold sore on lip. Patient with hx of stage 4 cancer actively on chemotherapy with last treatment 3 days prior.  Patient with pancytopenia. ANC 400.  No fever documented.   Pharmacy has been consulted for Vancomycin dosing for sepsis. Patient also receiving cefepime and 1 dose of acyclovir given in ED  Plan: Vancomycin 1000 mg LD given in ED Initiate vancomycin 750 mg Q24H. Goal AUC 400-550 Estimated AUC 502/Cmin: 12.3 Scr used: 0.8, VD 0.752, IBW  Follow up cultures   Weight: 47 kg (103 lb 11.6 oz)  Temp (24hrs), Avg:98.1 F (36.7 C), Min:98.1 F (36.7 C), Max:98.1 F (36.7 C)  Recent Labs  Lab 06/12/21 1325 06/17/21 1104 06/17/21 1257  WBC 1.2* 0.8*  --   CREATININE 0.46 0.61  --   LATICACIDVEN  --  2.1* 2.1*    Estimated Creatinine Clearance: 47.8 mL/min (by C-G formula based on SCr of 0.61 mg/dL).    No Known Allergies  Antimicrobials this admission: 8/7 Acyclovir >> x 1  8/7 cefepime >>  8/7 Vancomycin >>  Dose adjustments this admission: N/a  Microbiology results: 8/7 BCx: sent 8/7 UCx: sent   Thank you for allowing pharmacy to be a part of this patient's care.  Dorothe Pea, PharmD, BCPS Clinical Pharmacist   06/17/2021 2:30 PM

## 2021-06-17 NOTE — ED Notes (Signed)
Patient transported to CT 

## 2021-06-17 NOTE — ED Notes (Signed)
Pt via EMS from home. Pt presents to the ED for bilateral eye swelling, sore throat, and lethargy. Pt is from home. Pt states that she has had swelling in both eyes and it got worse the past 3 days. Pt is noted to have a cold sore on her lip. Family states that pt is more groggy and out of it than normal. Pt has a hx of Stage 4 Cancer and is on chemotherapy for it. Last tx of chemo was 3 days ago. Pt did take benadryl PTA. Pt is A&Ox4 and NAD. Pt is tachycardiac and tachypneic. Code Sepsis pt at this time.

## 2021-06-17 NOTE — ED Provider Notes (Signed)
[facility] Emergency Department Provider Note  _________________________________   Event Date/Time   First MD Initiated Contact with Patient 06/17/21 1043     (approximate)  I have reviewed the triage vital signs and the nursing notes.   HISTORY  Chief Complaint Code Sepsis    HPI Cathy Glover is a 65 y.o. female with 2 days of bilateral eye swelling getting worse and sore throat.  She has lung cancer and is getting chemo.  Last chemo was 3 days ago.  She is not complaining of any pain or itching or shortness of breath.  She reports no visual changes although her eyelids are both swollen left greater than right.  She denies any shortness of breath.  She is not having any chest pain.         Past Medical History:  Diagnosis Date   Cancer [facility])    Pulmonary nodule     Patient Active Problem List   Diagnosis Date Noted   Goals of care, counseling/discussion 05/10/2021   Primary cancer of right lower lobe of lung ([place]) 05/01/2021    Past Surgical History:  Procedure Laterality Date   IR IMAGING GUIDED PORT INSERTION  05/11/2021   VIDEO BRONCHOSCOPY WITH ENDOBRONCHIAL ULTRASOUND N/A 04/25/2021   Procedure: VIDEO BRONCHOSCOPY WITH ENDOBRONCHIAL ULTRASOUND;  Surgeon: Tyler Pita, MD;  Location: [facility] ORS;  Service: Cardiopulmonary;  Laterality: N/A;    Prior to Admission medications   Medication Sig Start Date End Date Taking? Authorizing Provider  albuterol (VENTOLIN HFA) 108 (90 Base) MCG/ACT inhaler Inhale 1-2 puffs into the lungs every 6 (six) hours as needed for wheezing or shortness of breath. 05/01/21   Cammie Sickle, MD  Budeson-Glycopyrrol-Formoterol (BREZTRI AEROSPHERE) 160-9-4.8 MCG/ACT AERO Inhale 2 puffs into the lungs in the morning and at bedtime. 05/24/21   Tyler Pita, MD  dexamethasone (DECADRON) 4 MG tablet Take 1 tablet (4 mg total) by mouth 2 (two) times daily. 06/04/21 07/04/21  Cammie Sickle, MD   dextromethorphan-guaiFENesin (MUCINEX DM) 30-600 MG 12hr tablet Take 1 tablet by mouth 2 (two) times daily.    [provider]  lidocaine-prilocaine (EMLA) cream Apply 1 application topically as needed. 05/01/21   Cammie Sickle, MD  loperamide (IMODIUM A-D) 2 MG tablet Take 2 mg by mouth as needed for diarrhea or loose stools.    [provider]  ondansetron (ZOFRAN) 8 MG tablet Take 1 tablet (8 mg total) by mouth every 8 (eight) hours as needed for nausea or vomiting. Patient not taking: Reported on 06/05/2021 05/01/21   Cammie Sickle, MD  prochlorperazine (COMPAZINE) 10 MG tablet Take 1 tablet (10 mg total) by mouth every 6 (six) hours as needed for nausea or vomiting. Patient not taking: Reported on 06/05/2021 05/01/21   Cammie Sickle, MD    Allergies Patient has no known allergies.  Family History  Problem Relation Age of Onset   Cancer Sister     Social History Social History   Tobacco Use   Smoking status: Former    Packs/day: 0.50    Years: 50.00    Pack years: 25.00    Types: Cigarettes    Quit date: 04/11/2021    Years since quitting: 0.1   Smokeless tobacco: Never   Tobacco comments:    Quit one month ago  Vaping Use   Vaping Use: Never used  Substance Use Topics   Alcohol use: Never   Drug use: Never    Review  of Systems  Constitutional:  Eyes: No visual changes. ENT: sore throat. Cardiovascular: Denies chest pain. Respiratory: Denies shortness of breath. Gastrointestinal: No abdominal pain.  No nausea, no vomiting.  No diarrhea.  No constipation. Genitourinary: Negative for dysuria. Musculoskeletal: Negative for back pain. Skin: Negative for rash. Neurological: Negative for headaches, focal weakness   ____________________________________________   PHYSICAL EXAM:  VITAL SIGNS: ED Triage Vitals  Enc Vitals Group     BP 06/17/21 1058 106/84     Pulse Rate 06/17/21 1058 (!) 129     Resp 06/17/21 1045 (!) 27      Temp 06/17/21 1058 98.1 F (36.7 C)     Temp Source 06/17/21 1058 Oral     SpO2 06/17/21 1058 98 %     Weight 06/17/21 1105 103 lb 11.6 oz (47 kg)     Height --      Head Circumference --      Peak Flow --      Pain Score --      Pain Loc --      Pain Edu? --      Excl. in [place]? --    Constitutional: Alert and oriented.  Looks tired and sleepy but is easily arousable Eyes: Conjunctivae are normal. PERRL. EOMI. fluorescein is negative.  Both eyelids are swollen left greater than right there is some purpura under the skin of the eyelid medially on the left eye Head: Atraumatic. Nose: No congestion/rhinnorhea. Mouth/Throat: Mucous membranes are moist.  Oropharynx non-erythematous.  There are no plaques or patches her mucous membranes are actually slightly pale. Neck: No stridor. Cardiovascular: Normal rate, regular rhythm. Grossly normal heart sounds.  Good peripheral circulation. Respiratory: Normal respiratory effort.  No retractions. Lungs CTAB. Gastrointestinal: Soft and nontender. No distention. No abdominal bruits.  Musculoskeletal: No lower extremity tenderness nor edema.   Neurologic:  Normal speech and language. No gross focal neurologic deficits are appreciated.  Skin:  Skin is warm, dry and intact.  There is the reddening and swelling of the eyelids with 1 isolated purpura is noted.  There is some red macules on the forehead and mottling on the chest but no itching.   ____________________________________________   LABS (all labs ordered are listed, but only abnormal results are displayed)  Labs Reviewed  COMPREHENSIVE METABOLIC PANEL - Abnormal; Notable for the following components:      Result Value   Sodium 126 (*)    Potassium 2.7 (*)    Chloride 88 (*)    Glucose, Bld 212 (*)    Calcium 8.5 (*)    Albumin 2.4 (*)    ALT 63 (*)    Alkaline Phosphatase 134 (*)    All other components within normal limits  LACTIC ACID, PLASMA - Abnormal; Notable for the following  components:   Lactic Acid, Venous 2.1 (*)    All other components within normal limits  CBC WITH DIFFERENTIAL/PLATELET - Abnormal; Notable for the following components:   WBC 0.8 (*)    RBC 3.05 (*)    Hemoglobin 9.7 (*)    HCT 27.1 (*)    Platelets 81 (*)    nRBC 2.5 (*)    Neutro Abs 0.4 (*)    Lymphs Abs 0.1 (*)    Abs Immature Granulocytes 0.21 (*)    All other components within normal limits  BLOOD GAS, VENOUS - Abnormal; Notable for the following components:   pH, Ven 7.48 (*)    pCO2, Ven 36 (*)  pO2, Ven <31.0 (*)    Acid-Base Excess 3.2 (*)    All other components within normal limits  URINE CULTURE  CULTURE, BLOOD (ROUTINE X 2)  CULTURE, BLOOD (ROUTINE X 2)  RESP PANEL BY RT-PCR (FLU A&B, COVID) ARPGX2  LACTIC ACID, PLASMA  URINALYSIS, COMPLETE (UACMP) WITH MICROSCOPIC   ____________________________________________  EKG  __EKG read interpreted by me shows sinus tachycardia rate of 119 normal axis no obvious acute ST-T wave changes.  __________________________________________  RADIOLOGY Gertha Calkin, personally viewed and evaluated these images (plain radiographs) as part of my medical decision making, as well as reviewing the written report by the radiologist.  ED MD interpretation: There is some haziness in the right middle lobe area but I need to compare to the old chest x-ray.  Radiologist has not seen the chest x-ray yet. Chest x-ray actually looks somewhat better than 1 done on June 2 of this year. Official radiology report(s): CT Maxillofacial W Contrast  Result Date: 06/17/2021 CLINICAL DATA:  Maxillofacial pain Swelling in the eyelids. History of metastatic lung cancer. EXAM: CT MAXILLOFACIAL WITH CONTRAST TECHNIQUE: Multidetector CT imaging of the maxillofacial structures was performed with intravenous contrast. Multiplanar CT image reconstructions were also generated. CONTRAST:  70mL OMNIPAQUE IOHEXOL 350 MG/ML SOLN COMPARISON:  Head MRI  05/04/2021.  PET-CT 05/03/2021. FINDINGS: Osseous: No acute fracture or destructive osseous process. Edentulous. Orbits: Moderate swelling/thickening of the preseptal soft tissues bilaterally, left greater than right. No fluid collection, postseptal inflammation, or intraorbital mass. Grossly intact globes. Sinuses: Small volume fluid in the left sphenoid sinus. Underdeveloped right mastoid air cells which are partially chronically opacified with persistent fluid in the right middle ear cavity as well. Clear left mastoid air cells. Soft tissues: Soft tissue swelling involving the upper lip. Partially visualized subocclusive thrombus in the right internal jugular vein in the mid and lower neck above a Port-A-Cath. Partially visualized lymphadenopathy in the left lower neck and left supraclavicular region with the largest node covered on today's study measuring 1.1 cm in short axis, overall smaller than on the prior PET-CT. Limited intracranial: Mild vasogenic edema in the right temporoparietal region, less than on the prior MRI and with the known underlying mass not well demonstrated on this maxillofacial CT. IMPRESSION: 1. Bilateral periorbital and upper lip soft tissue swelling. No evidence of postseptal cellulitis or abscess. 2. Partially visualized subocclusive thrombus in the right internal jugular vein above a Port-A-Cath. 3. Partially visualized left lower neck and left supraclavicular lymphadenopathy, likely improved from the prior PET-CT. 4. Decreased right temporoparietal vasogenic edema. Electronically Signed   By: Logan Bores M.D.   On: 06/17/2021 13:04   DG Chest Portable 1 View  Result Date: 06/17/2021 CLINICAL DATA:  Sepsis, lung cancer, ongoing chemotherapy EXAM: PORTABLE CHEST 1 VIEW COMPARISON:  04/12/2021 FINDINGS: The heart size and mediastinal contours are within normal limits. Right chest port catheter. Irregular and nodular opacity projecting the perihilar right lung and right lung base, in  keeping with known primary lung malignancy. No new or acute appearing airspace disease. The visualized skeletal structures are unremarkable. IMPRESSION: Irregular and nodular opacity projecting the perihilar right lung and right lung base, in keeping with known primary lung malignancy. No new or acute appearing airspace disease. Electronically Signed   By: Eddie Candle M.D.   On: 06/17/2021 11:46    ____________________________________________   PROCEDURES  Procedure(s) performed (including Critical Care):  Procedures   ____________________________________________   INITIAL IMPRESSION / ASSESSMENT AND PLAN / ED  COURSE   On my review of the CT scan the patient appears to have a lot of increased vascularity in both eyelids.  Clinically she would have preseptal cellulitis and this does appear to be true.  She does have a low-grade fever and is neutropenic.  I will await the official reading of this report but anticipate that we will have to get her in the hospital.  Her heart rate has come down with fluids.  Her lactic acid is elevated and additionally she has a sodium 126 which is quite low. ----------------------------------------- 1:13 PM on 06/17/2021 ----------------------------------------- CT report has returned.  There is also a partially occlusive internal jugular vein clot above the port.  And decreased vasogenic edema in the brain.  I still think the patient needs to come in the hospital.  I have consulted the hospitalist.         ____________________________________________   FINAL CLINICAL IMPRESSION(S) / ED DIAGNOSES  Final diagnoses:  Febrile neutropenia ([place])  Preseptal cellulitis  Hyponatremia     ED Discharge Orders     None        Note:  This document was prepared using Dragon voice recognition software and may include unintentional dictation errors.    Nena Polio, MD 06/17/21 1314

## 2021-06-18 ENCOUNTER — Inpatient Hospital Stay: Payer: 59

## 2021-06-18 ENCOUNTER — Encounter: Payer: Self-pay | Admitting: Internal Medicine

## 2021-06-18 DIAGNOSIS — D61810 Antineoplastic chemotherapy induced pancytopenia: Secondary | ICD-10-CM | POA: Diagnosis not present

## 2021-06-18 DIAGNOSIS — R7881 Bacteremia: Secondary | ICD-10-CM | POA: Diagnosis not present

## 2021-06-18 DIAGNOSIS — A419 Sepsis, unspecified organism: Secondary | ICD-10-CM

## 2021-06-18 DIAGNOSIS — E871 Hypo-osmolality and hyponatremia: Secondary | ICD-10-CM

## 2021-06-18 DIAGNOSIS — B965 Pseudomonas (aeruginosa) (mallei) (pseudomallei) as the cause of diseases classified elsewhere: Secondary | ICD-10-CM

## 2021-06-18 DIAGNOSIS — T451X5A Adverse effect of antineoplastic and immunosuppressive drugs, initial encounter: Secondary | ICD-10-CM

## 2021-06-18 DIAGNOSIS — E876 Hypokalemia: Secondary | ICD-10-CM

## 2021-06-18 DIAGNOSIS — C3431 Malignant neoplasm of lower lobe, right bronchus or lung: Secondary | ICD-10-CM

## 2021-06-18 DIAGNOSIS — D709 Neutropenia, unspecified: Secondary | ICD-10-CM | POA: Diagnosis not present

## 2021-06-18 LAB — BLOOD CULTURE ID PANEL (REFLEXED) - BCID2

## 2021-06-18 LAB — BASIC METABOLIC PANEL
Anion gap: 14 (ref 5–15)
BUN: 12 mg/dL (ref 8–23)
CO2: 23 mmol/L (ref 22–32)
Calcium: 8.4 mg/dL — ABNORMAL LOW (ref 8.9–10.3)
Chloride: 94 mmol/L — ABNORMAL LOW (ref 98–111)
Creatinine, Ser: 0.52 mg/dL (ref 0.44–1.00)
GFR, Estimated: >60 mL/min (ref >60)
Glucose, Bld: 152 mg/dL — ABNORMAL HIGH (ref 70–99)
Potassium: 3.2 mmol/L — ABNORMAL LOW (ref 3.5–5.1)
Sodium: 131 mmol/L — ABNORMAL LOW (ref 135–145)

## 2021-06-18 LAB — URINALYSIS, COMPLETE (UACMP) WITH MICROSCOPIC
Bilirubin Urine: NEGATIVE
Glucose, UA: 150 mg/dL — AB
Ketones, ur: NEGATIVE mg/dL
Leukocytes,Ua: NEGATIVE
Nitrite: POSITIVE — AB
Protein, ur: 30 mg/dL — AB
Specific Gravity, Urine: 1.024 (ref 1.005–1.030)
pH: 6 (ref 5.0–8.0)

## 2021-06-18 LAB — CBC
HCT: 27 % — ABNORMAL LOW (ref 36.0–46.0)
Hemoglobin: 9.6 g/dL — ABNORMAL LOW (ref 12.0–15.0)
MCH: 32.2 pg (ref 26.0–34.0)
MCHC: 35.6 g/dL (ref 30.0–36.0)
MCV: 90.6 fL (ref 80.0–100.0)
Platelets: 68 10*3/uL — ABNORMAL LOW (ref 150–400)
RBC: 2.98 MIL/uL — ABNORMAL LOW (ref 3.87–5.11)
RDW: 13.8 % (ref 11.5–15.5)
WBC: 1.1 10*3/uL — CL (ref 4.0–10.5)
nRBC: 1.9 % — ABNORMAL HIGH (ref 0.0–0.2)

## 2021-06-18 LAB — PROTIME-INR
INR: 1.3 — ABNORMAL HIGH (ref 0.8–1.2)
Prothrombin Time: 16.4 seconds — ABNORMAL HIGH (ref 11.4–15.2)

## 2021-06-18 LAB — PROCALCITONIN: Procalcitonin: 2.3 ng/mL

## 2021-06-18 LAB — CORTISOL-AM, BLOOD: Cortisol - AM: 28 ug/dL — ABNORMAL HIGH (ref 6.7–22.6)

## 2021-06-18 MED ORDER — POTASSIUM CHLORIDE CRYS ER 20 MEQ PO TBCR
40.0000 meq | EXTENDED_RELEASE_TABLET | Freq: Once | ORAL | Status: DC
  Filled 2021-06-18: qty 2

## 2021-06-18 MED ORDER — TBO-FILGRASTIM 300 MCG/0.5ML ~~LOC~~ SOSY
300.0000 ug | PREFILLED_SYRINGE | Freq: Every day | SUBCUTANEOUS | Status: DC
  Administered 2021-06-19 – 2021-06-21 (×3): 300 ug via SUBCUTANEOUS
  Filled 2021-06-18 (×3): qty 0.5

## 2021-06-18 MED ORDER — UMECLIDINIUM-VILANTEROL 62.5-25 MCG/INH IN AEPB
1.0000 | INHALATION_SPRAY | Freq: Once | RESPIRATORY_TRACT | Status: DC
  Filled 2021-06-18 (×2): qty 14

## 2021-06-18 MED ORDER — MOXIFLOXACIN HCL 0.5 % OP SOLN
1.0000 [drp] | Freq: Three times a day (TID) | OPHTHALMIC | Status: DC
  Administered 2021-06-18 – 2021-06-21 (×9): 1 [drp] via OPHTHALMIC
  Filled 2021-06-18 (×2): qty 3

## 2021-06-18 MED ORDER — UMECLIDINIUM-VILANTEROL 62.5-25 MCG/INH IN AEPB
1.0000 | INHALATION_SPRAY | Freq: Every day | RESPIRATORY_TRACT | Status: DC
  Administered 2021-06-18 – 2021-06-21 (×3): 1 via RESPIRATORY_TRACT
  Filled 2021-06-18: qty 14

## 2021-06-18 NOTE — Progress Notes (Signed)
PROGRESS NOTE    Prisilla Kocsis  VVO:[id number] DOB: 1956/02/04 DOA: 06/17/2021 PCP: Pcp, No    Brief Narrative:  Crystie Yanko is a 65 y.o. 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 Patient denies any trauma to her eyelids and denies having any pain in her eyes. Upon arrival to the ER she was noted to be groggy but had received Benadryl 25 mg x 1 dose due to concern for possible drug allergy. She has had diarrhea, 1 episode of emesis and poor oral intake but denies having any abdominal pain, no dysuria, no nocturia, no frequency, no dizziness, no lightheadedness, no headache, no chest pain, no shortness of breath, no focal deficits  Respiratory viral panel is negative Maxillofacial CT showed bilateral periorbital and upper lip soft tissue swelling. No evidence of postseptal cellulitis or abscess. Partially visualized subocclusive thrombus in the right internal jugular vein above a Port-A-Cath. Partially visualized left lower neck and left supraclavicular lymphadenopathy, likely improved from the prior PET-CT. Decreased right temporoparietal vasogenic edema. CXR shows irregular and nodular opacity projecting the perihilar right lung and right lung base, in keeping with known primary lung malignancy. No new or acute appearing airspace disease.  Admitted for periorbital cellulitis.        Consultants:    Procedures:   Antimicrobials:      Subjective: Unable to open eyes. No complaints of n/v this am  Objective: Vitals:   06/18/21 0615 06/18/21 0637 06/18/21 0723 06/18/21 0724  BP:  112/79 123/79   Pulse: (!) 32 62  95  Resp: 20 18 20 11   Temp:      TempSrc:  Oral    SpO2: (!)  23% 96%  97%  Weight:        Intake/Output Summary (Last 24 hours) at 06/18/2021 0832 Last data filed at 06/18/2021 0158 Gross per 24 hour  Intake 3717.88 ml  Output --  Net 3717.88 ml   Filed Weights   06/17/21 1105  Weight: 47 kg    Examination:  General exam: Appears calm and comfortable  HEENT: b/l eyes swelling, some discharge , unable to open eyes. Mild area of redness Respiratory system: Clear to auscultation. Respiratory effort normal. Cardiovascular system: S1 & S2 heard, RRR. No JVD, murmurs, rubs, gallops or clicks.  Gastrointestinal system: Abdomen is nondistended, soft and nontender. Normal bowel sounds heard. Extremities: no edema Skin:  Psychiatry: Mood & affect appropriate in current setting.     Data Reviewed: I have personally reviewed following labs and imaging studies  CBC: Recent Labs  Lab 06/12/21 1325 06/17/21 1104 06/18/21 0717  WBC 1.2* 0.8* 1.1*  NEUTROABS 0.7* 0.4*  --   HGB 11.4* 9.7* 9.6*  HCT 32.1* 27.1* 27.0*  MCV 88.7 88.9 90.6  PLT 109* 81* 68*   Basic Metabolic Panel: Recent Labs  Lab 06/12/21 1325 06/17/21 1104 06/18/21 0717  NA 128* 126* 131*  K 3.4* 2.7* 3.2*  CL 96* 88* 94*  CO2 24 25 23   GLUCOSE 169* 212* 152*  BUN 27* 12 12  CREATININE 0.46 0.61 0.52  CALCIUM 8.0* 8.5* 8.4*   GFR: Estimated Creatinine Clearance: 47.8 mL/min (by C-G formula based on SCr of 0.52 mg/dL). Liver Function Tests: Recent  Labs  Lab 06/12/21 1325 06/17/21 1104  AST 27 32  ALT 26 63*  ALKPHOS 63 134*  BILITOT 0.6 0.9  PROT 6.1* 7.4  ALBUMIN 2.8* 2.4*   No results for input(s): LIPASE, AMYLASE in the last 168 hours. No results for input(s): AMMONIA in the last 168 hours. Coagulation Profile: Recent Labs  Lab 06/18/21 0717  INR 1.3*   Cardiac Enzymes: No results for input(s): CKTOTAL, CKMB, CKMBINDEX, TROPONINI in the last 168 hours. BNP (last 3 results) No results for input(s): PROBNP in the last 8760 hours. HbA1C: No  results for input(s): HGBA1C in the last 72 hours. CBG: No results for input(s): GLUCAP in the last 168 hours. Lipid Profile: No results for input(s): CHOL, HDL, LDLCALC, TRIG, CHOLHDL, LDLDIRECT in the last 72 hours. Thyroid Function Tests: No results for input(s): TSH, T4TOTAL, FREET4, T3FREE, THYROIDAB in the last 72 hours. Anemia Panel: No results for input(s): VITAMINB12, FOLATE, FERRITIN, TIBC, IRON, RETICCTPCT in the last 72 hours. Sepsis Labs: Recent Labs  Lab 06/17/21 1104 06/17/21 1257 06/17/21 1524 06/17/21 2006  LATICACIDVEN 2.1* 2.1* 2.7* 1.6    Recent Results (from the past 240 hour(s))  Culture, blood (routine x 2)     Status: None (Preliminary result)   Collection Time: 06/17/21 11:04 AM   Specimen: BLOOD  Result Value Ref Range Status   Specimen Description   Final    BLOOD  LEFT AC Performed at [facility], [address], [place], [place] [address]    Special Requests   Final    BOTTLES DRAWN AEROBIC AND ANAEROBIC Blood Culture adequate volume Performed at [facility], [address], [place], [place] [address]    Culture  Setup Time GRAM NEGATIVE RODS AEROBIC BOTTLE ONLY   Final   Culture GRAM NEGATIVE RODS  Final   Report Status PENDING  Incomplete  Culture, blood (routine x 2)     Status: None (Preliminary result)   Collection Time: 06/17/21 11:04 AM   Specimen: BLOOD  Result Value Ref Range Status   Specimen Description   Final    BLOOD RIGHT WRIST Performed at [facility], [address] [place], [place] [address]    Special Requests   Final    BOTTLES DRAWN AEROBIC AND ANAEROBIC Blood Culture results may not be optimal due to an inadequate volume of blood received in culture bottles Performed at [facility], [address] [place], [place] [address]    Culture  Setup Time   Final    GRAM NEGATIVE RODS IN BOTH AEROBIC AND ANAEROBIC BOTTLES Organism ID to follow CRITICAL RESULT CALLED TO, READ  BACK BY AND VERIFIED WITH: JASON ROBINS @0202  06/18/2021 LFD    Culture GRAM NEGATIVE RODS  Final   Report Status PENDING  Incomplete  Blood Culture ID Panel (Reflexed)     Status: Abnormal   Collection Time: 06/17/21 11:04 AM  Result Value Ref Range Status   Enterococcus faecalis NOT DETECTED NOT DETECTED Final   Enterococcus Faecium NOT DETECTED NOT DETECTED Final   Listeria monocytogenes NOT DETECTED NOT DETECTED Final   Staphylococcus species NOT DETECTED NOT DETECTED Final   Staphylococcus aureus (BCID) NOT DETECTED NOT DETECTED Final   Staphylococcus epidermidis NOT DETECTED NOT DETECTED Final   Staphylococcus lugdunensis NOT DETECTED NOT DETECTED Final   Streptococcus species NOT DETECTED NOT DETECTED Final   Streptococcus agalactiae NOT DETECTED NOT DETECTED Final   Streptococcus pneumoniae NOT DETECTED NOT DETECTED Final   Streptococcus pyogenes  NOT DETECTED NOT DETECTED Final   A.calcoaceticus-baumannii NOT DETECTED NOT DETECTED Final   Bacteroides fragilis NOT DETECTED NOT DETECTED Final   Enterobacterales NOT DETECTED NOT DETECTED Final   Enterobacter cloacae complex NOT DETECTED NOT DETECTED Final   Escherichia coli NOT DETECTED NOT DETECTED Final   Klebsiella aerogenes NOT DETECTED NOT DETECTED Final   Klebsiella oxytoca NOT DETECTED NOT DETECTED Final   Klebsiella pneumoniae NOT DETECTED NOT DETECTED Final   Proteus species NOT DETECTED NOT DETECTED Final   Salmonella species NOT DETECTED NOT DETECTED Final   Serratia marcescens NOT DETECTED NOT DETECTED Final   Haemophilus influenzae NOT DETECTED NOT DETECTED Final   Neisseria meningitidis NOT DETECTED NOT DETECTED Final   Pseudomonas aeruginosa DETECTED (A) NOT DETECTED Final    Comment: CRITICAL RESULT CALLED TO, READ BACK BY AND VERIFIED WITH: JASON ROBINS @ 0202 06/18/2021 LFD    Stenotrophomonas maltophilia NOT DETECTED NOT DETECTED Final   Candida albicans NOT DETECTED NOT DETECTED Final   Candida auris NOT  DETECTED NOT DETECTED Final   Candida glabrata NOT DETECTED NOT DETECTED Final   Candida krusei NOT DETECTED NOT DETECTED Final   Candida parapsilosis NOT DETECTED NOT DETECTED Final   Candida tropicalis NOT DETECTED NOT DETECTED Final   Cryptococcus neoformans/gattii NOT DETECTED NOT DETECTED Final   CTX-M ESBL NOT DETECTED NOT DETECTED Final   Carbapenem resistance IMP NOT DETECTED NOT DETECTED Final   Carbapenem resistance KPC NOT DETECTED NOT DETECTED Final   Carbapenem resistance NDM NOT DETECTED NOT DETECTED Final   Carbapenem resistance VIM NOT DETECTED NOT DETECTED Final    Comment: Performed at [facility], [address] [place], [place] [address]  Resp Panel by RT-PCR (Flu A&B, Covid) Nasopharyngeal Swab     Status: None   Collection Time: 06/17/21  1:00 PM   Specimen: Nasopharyngeal Swab; Nasopharyngeal(NP) swabs in vial transport medium  Result Value Ref Range Status   SARS Coronavirus 2 by RT PCR NEGATIVE NEGATIVE Final    Comment: (NOTE) SARS-CoV-2 target nucleic acids are NOT DETECTED.  The SARS-CoV-2 RNA is generally detectable in upper respiratory specimens during the acute phase of infection. The lowest concentration of SARS-CoV-2 viral copies this assay can detect is 138 copies/mL. A negative result does not preclude SARS-Cov-2 infection and should not be used as the sole basis for treatment or other patient management decisions. A negative result may occur with  improper specimen collection/handling, submission of specimen other than nasopharyngeal swab, presence of viral mutation(s) within the areas targeted by this assay, and inadequate number of viral copies(<138 copies/mL). A negative result must be combined with clinical observations, patient history, and epidemiological information. The expected result is Negative.  Fact Sheet for Patients:  EntrepreneurPulse.com.au  Fact Sheet for Healthcare Providers:   IncredibleEmployment.be  This test is no t yet approved or cleared by the [place] FDA and  has been authorized for detection and/or diagnosis of SARS-CoV-2 by FDA under an Emergency Use Authorization (EUA). This EUA will remain  in effect (meaning this test can be used) for the duration of the COVID-19 declaration under Section 564(b)(1) of the Act, 21 U.S.C.section 360bbb-3(b)(1), unless the authorization is terminated  or revoked sooner.       Influenza A by PCR NEGATIVE NEGATIVE Final   Influenza B by PCR NEGATIVE NEGATIVE Final    Comment: (NOTE) The Xpert Xpress SARS-CoV-2/FLU/RSV plus assay is intended as an aid in the diagnosis of influenza from Nasopharyngeal swab specimens and should not  be used as a sole basis for treatment. Nasal washings and aspirates are unacceptable for Xpert Xpress SARS-CoV-2/FLU/RSV testing.  Fact Sheet for Patients: EntrepreneurPulse.com.au  Fact Sheet for Healthcare Providers: IncredibleEmployment.be  This test is not yet approved or cleared by the [place] FDA and has been authorized for detection and/or diagnosis of SARS-CoV-2 by FDA under an Emergency Use Authorization (EUA). This EUA will remain in effect (meaning this test can be used) for the duration of the COVID-19 declaration under Section 564(b)(1) of the Act, 21 U.S.C. section 360bbb-3(b)(1), unless the authorization is terminated or revoked.  Performed at [facility], [address], [place], [place] [address]          Radiology Studies: CT Maxillofacial W Contrast  Result Date: 06/17/2021 CLINICAL DATA:  Maxillofacial pain Swelling in the eyelids. History of metastatic lung cancer. EXAM: CT MAXILLOFACIAL WITH CONTRAST TECHNIQUE: Multidetector CT imaging of the maxillofacial structures was performed with intravenous contrast. Multiplanar CT image reconstructions were also generated. CONTRAST:  37mL  OMNIPAQUE IOHEXOL 350 MG/ML SOLN COMPARISON:  Head MRI 05/04/2021.  PET-CT 05/03/2021. FINDINGS: Osseous: No acute fracture or destructive osseous process. Edentulous. Orbits: Moderate swelling/thickening of the preseptal soft tissues bilaterally, left greater than right. No fluid collection, postseptal inflammation, or intraorbital mass. Grossly intact globes. Sinuses: Small volume fluid in the left sphenoid sinus. Underdeveloped right mastoid air cells which are partially chronically opacified with persistent fluid in the right middle ear cavity as well. Clear left mastoid air cells. Soft tissues: Soft tissue swelling involving the upper lip. Partially visualized subocclusive thrombus in the right internal jugular vein in the mid and lower neck above a Port-A-Cath. Partially visualized lymphadenopathy in the left lower neck and left supraclavicular region with the largest node covered on today's study measuring 1.1 cm in short axis, overall smaller than on the prior PET-CT. Limited intracranial: Mild vasogenic edema in the right temporoparietal region, less than on the prior MRI and with the known underlying mass not well demonstrated on this maxillofacial CT. IMPRESSION: 1. Bilateral periorbital and upper lip soft tissue swelling. No evidence of postseptal cellulitis or abscess. 2. Partially visualized subocclusive thrombus in the right internal jugular vein above a Port-A-Cath. 3. Partially visualized left lower neck and left supraclavicular lymphadenopathy, likely improved from the prior PET-CT. 4. Decreased right temporoparietal vasogenic edema. Electronically Signed   By: Logan Bores M.D.   On: 06/17/2021 13:04   DG Chest Portable 1 View  Result Date: 06/17/2021 CLINICAL DATA:  Sepsis, lung cancer, ongoing chemotherapy EXAM: PORTABLE CHEST 1 VIEW COMPARISON:  04/12/2021 FINDINGS: The heart size and mediastinal contours are within normal limits. Right chest port catheter. Irregular and nodular opacity  projecting the perihilar right lung and right lung base, in keeping with known primary lung malignancy. No new or acute appearing airspace disease. The visualized skeletal structures are unremarkable. IMPRESSION: Irregular and nodular opacity projecting the perihilar right lung and right lung base, in keeping with known primary lung malignancy. No new or acute appearing airspace disease. Electronically Signed   By: Eddie Candle M.D.   On: 06/17/2021 11:46        Scheduled Meds:  budesonide (PULMICORT) nebulizer solution  0.25 mg Nebulization BID   dexamethasone  4 mg Oral BID   dextromethorphan-guaiFENesin  1 tablet Oral BID   potassium chloride  40 mEq Oral Daily   potassium chloride  40 mEq Oral Once   Tbo-filgastrim (GRANIX) SQ  300 mcg Subcutaneous Daily   umeclidinium-vilanterol  1  puff Inhalation Daily   Continuous Infusions:  ceFEPime (MAXIPIME) IV     lactated ringers with kcl Stopped (06/18/21 0158)   vancomycin      Assessment & Plan:   Principal Problem:   Sepsis ([place]) Active Problems:   Primary cancer of right lower lobe of lung (HCC)   Hyponatremia   Hypokalemia   Antineoplastic chemotherapy induced pancytopenia (HCC)   DVT (deep venous thrombosis) (HCC)   Cellulitis of periorbital region of both eyes   Sepsis from cellulitis of periorbital region of both eyes (POA) Continue ivf Lactic acid improved WBC improved Continue iv abx Discussed case with ophthalmology this am, will see pt later this evening. 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  and a component of dehydration  Sodium level has improved with IV fluids, level 131 today Will decrease IV fluid rate to 75 MLS per hour Monitor levels        Hypokalemia Related to GI losses from diarrhea Will continue supplementation       Stage IV small cell lung cancer Patient has completed radiation therapy but is still on chemotherapy Continue Decadron Follow-up with oncology as an outpatient for continued chemotherapy       Subocclusive thrombus in the right IJ Unable to anticoagulate patient due to severe thrombocytopenia  Will discuss with oncology  Venous US UE RT today revealed +dvt RIJ, Prox RT [place], both nonocclusive +SVT in Rt cephalic vein, nonocclusive    DVT prophylaxis: SCD Code Status: Full Family Communication: Husband at bedside Disposition Plan:  Status is: Inpatient  Remains inpatient appropriate because:Inpatient level of care appropriate due to severity of illness  Dispo: The patient is from: Home              Anticipated d/c is to: Home              Patient currently is not medically stable to d/c.   Difficult to place patient No            LOS: 1 day   Time spent: 45 minutes with more than 50% on [place], MD [facility] Pager [phone]  If 7PM-7AM, please contact night-coverage 06/18/2021, 8:32 AM

## 2021-06-18 NOTE — Consult Note (Signed)
NAME: Cathy Glover  DOB: 01/17/1956  MRN: [id number]  Date/Time: 06/18/2021 7:14 PM  REQUESTING PROVIDER: Dr.Amery Subjective:  REASON FOR CONSULT: pseudomonas bacteremia ? Cathy Glover is a 65 y.o. 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  As per patient there was a small lesion that appeared above her lip which she thought was cold sore.  This was before she got her chemo on 06/05/2021.  It progressed to become a scab. On Saturday morning she noticed swelling in the left eye which progressed to the right side as well.  There was also some discharge and later that scabbed. She did not have any problems with eyesight Did not have any fever.  The ED temperature was 98.1, heart rate 103, BP 114/85, respiratory 16 and sats 96%. WBC was 0.8, Hb 9.7, platelet 81 and creatinine 0.61.  Lactate was 2.7.  Blood cultures were sent. CT maxillofacial with contrast showed bilateral periorbital and upper lip soft tissue swelling.  No evidence of post septal cellulitis or abscess.  Partially visualized subocclusive thrombus in the right internal jugular vein above the Port-A-Cath.  Partially visualized left lower neck and left supraclavicular lymphadenopathy likely improved from prior PET/CT.  Decreased right temporoparietal vasogenic edema.  Patient was started on vancomycin and cefepime. Blood culture came positive for Pseudomonas. Ophthalmology was consulted and the diagnosis was preseptal cellulitis with bacterial conjunctivitis bilaterally.  Could not rule out reaction to chemotherapy. Was also seen by oncology. I am asked to see the patient because of Pseudomonas in the blood culture. Patient is awake and  alert. She says she is able to see with her eyes.  She does not have any pain. No cough or shortness of breath Husband at bedside.  Past Medical History:  Diagnosis Date   Cancer [facility])    Pulmonary nodule     Past Surgical History:  Procedure Laterality Date   IR IMAGING GUIDED PORT INSERTION  05/11/2021   VIDEO BRONCHOSCOPY WITH ENDOBRONCHIAL ULTRASOUND N/A 04/25/2021   Procedure: VIDEO BRONCHOSCOPY WITH ENDOBRONCHIAL ULTRASOUND;  Surgeon: Tyler Pita, MD;  Location: [facility] ORS;  Service: Cardiopulmonary;  Laterality: N/A;    Social History   Socioeconomic History   Marital status: Married    Spouse name: Not on file   Number of children: Not on file   Years of education: Not on file   Highest education level: Not on file  Occupational History   Not on file  Tobacco Use   Smoking status: Former    Packs/day: 0.50    Years: 50.00    Pack years: 25.00    Types: Cigarettes    Quit date: 04/11/2021    Years since quitting: 0.1   Smokeless tobacco: Never   Tobacco comments:    Quit one month ago  Vaping Use   Vaping Use: Never used  Substance and Sexual Activity   Alcohol use: Never   Drug use: Never   Sexual activity: Not on file  Other Topics Concern   Not on file  Social History Narrative   Lives in [place] with husband; 2 grown sons; quit smoking- June 2022. No alcohol.    Social Determinants of Health   Financial Resource Strain: Not on file  Food Insecurity: Not on file  Transportation Needs: Not on file  Physical Activity: Not on file  Stress: Not on file  Social Connections: Not on file  Intimate Partner Violence: Not on file    Family History  Problem Relation Age of Onset   Cancer Sister    No Known Allergies I? Current Facility-Administered Medications  Medication Dose Route Frequency Provider Last Rate Last Admin   acetaminophen (TYLENOL) tablet 650 mg  650 mg Oral Q6H PRN Agbata, Tochukwu, MD       Or   acetaminophen (TYLENOL) suppository  650 mg  650 mg Rectal Q6H PRN Agbata, Tochukwu, MD       budesonide (PULMICORT) nebulizer solution 0.25 mg  0.25 mg Nebulization BID Dorothe Pea, RPH   0.25 mg at 06/18/21 1014   ceFEPIme (MAXIPIME) 2 g in sodium chloride 0.9 % 100 mL IVPB  2 g Intravenous Q12H Dorothe Pea, RPH   Stopped at 06/18/21 1249   dexamethasone (DECADRON) tablet 4 mg  4 mg Oral BID Agbata, Tochukwu, MD   4 mg at 06/18/21 1010   dextromethorphan-guaiFENesin (MUCINEX DM) 30-600 MG per 12 hr tablet 1 tablet  1 tablet Oral BID Agbata, Tochukwu, MD   1 tablet at 06/17/21 1552   iohexol (OMNIPAQUE) 350 MG/ML injection 75 mL  75 mL Intravenous Once PRN Nena Polio, MD       lactated ringers 1,000 mL with potassium chloride 40 mEq infusion   Intravenous Continuous Nolberto Hanlon, MD 75 mL/hr at 06/18/21 1442 Infusion Verify at 06/18/21 1442   moxifloxacin (VIGAMOX) 0.5 % ophthalmic solution 1 drop  1 drop Both Eyes TID Porfilio, Gwyndolyn Saxon, MD       ondansetron [facility]) tablet 4 mg  4 mg Oral Q6H PRN Agbata, Tochukwu, MD   4 mg at 06/17/21 2207   Or   ondansetron (ZOFRAN) injection 4 mg  4 mg Intravenous Q6H PRN Agbata, Tochukwu, MD       potassium chloride (KLOR-CON) packet 40 mEq  40 mEq Oral Daily Nena Polio, MD   40 mEq at 06/18/21 1010   potassium chloride SA (KLOR-CON) CR tablet 40 mEq  40 mEq Oral Once Nolberto Hanlon, MD       Tbo-Filgrastim [facility]) injection 300 mcg  300 mcg Subcutaneous Daily Brahmanday, Govinda R, MD       umeclidinium-vilanterol (ANORO ELLIPTA) 62.5-25 MCG/INH 1 puff  1 puff Inhalation Daily Dorothe Pea, RPH       umeclidinium-vilanterol (ANORO ELLIPTA) 62.5-25 MCG/INH 1 puff  1 puff Inhalation Once Nolberto Hanlon, MD       vancomycin (VANCOREADY) IVPB 750 mg/150 mL  750 mg Intravenous Q24H Dorothe Pea, RPH 75 mL/hr at 06/18/21 1442 Infusion Verify at 06/18/21 1442     Abtx:  Anti-infectives (From admission, onward)    Start     Dose/Rate Route Frequency Ordered Stop   06/18/21  1300  vancomycin (VANCOREADY) IVPB 750 mg/150 mL        750 mg 150 mL/hr over 60 Minutes Intravenous Every 24 hours 06/17/21 1428     06/18/21 1200  ceFEPIme (MAXIPIME) 2 g in sodium chloride 0.9 % 100 mL IVPB       See Hyperspace for full Linked Orders Report.   2 g 200 mL/hr over 30 Minutes Intravenous Every 12 hours 06/17/21 1422     06/17/21 1430  ceFEPIme (MAXIPIME) 1 g in sodium chloride 0.9 % 100 mL IVPB  See Hyperspace for full Linked Orders Report.   1 g 200 mL/hr over 30 Minutes Intravenous  Once 06/17/21 1422 06/17/21 1555   06/17/21 1415  ceFEPIme (MAXIPIME) 2 g in sodium chloride 0.9 % 100 mL IVPB  Status:  Discontinued        2 g 200 mL/hr over 30 Minutes Intravenous Every 8 hours 06/17/21 1407 06/17/21 1422   06/17/21 1200  acyclovir (ZOVIRAX) 470 mg in dextrose 5 % 250 mL IVPB        10 mg/kg  47.1 kg 259.4 mL/hr over 60 Minutes Intravenous  Once 06/17/21 1104 06/17/21 1509   06/17/21 1115  vancomycin (VANCOCIN) IVPB 1000 mg/200 mL premix        1,000 mg 200 mL/hr over 60 Minutes Intravenous  Once 06/17/21 1101 06/17/21 1355   06/17/21 1115  ceFEPIme (MAXIPIME) 1 g in sodium chloride 0.9 % 100 mL IVPB        1 g 200 mL/hr over 30 Minutes Intravenous  Once 06/17/21 1101 06/17/21 1242       REVIEW OF SYSTEMS:  Const: negative fever, negative chills, negative weight loss Eyes: negative diplopia or visual changes, negative eye pain ENT: negative coryza, negative sore throat Resp: negative cough, hemoptysis, dyspnea Cards: negative for chest pain, palpitations, lower extremity edema GU: negative for frequency, dysuria and hematuria GI: Negative for abdominal pain, diarrhea, bleeding, constipation Skin: As above heme: negative for easy bruising and gum/nose bleeding MS: Generalized weakness Neurolo: Positive headaches, dizziness, vertigo, memory problems  Psych: negative for feelings of anxiety, depression  Endocrine: negative for thyroid,  diabetes Allergy/Immunology- negative for any medication or food allergies ?  Objective:  VITALS:  BP 126/81 (BP Location: Right Arm)   Pulse (!) 115   Temp 97.7 F (36.5 C) (Oral)   Resp 18   Ht '4\' 11"'  (1.499 m)   Wt 49.5 kg   SpO2 100%   BMI 22.04 kg/m  PHYSICAL EXAM:  General: Alert, cooperative, no distress, appears stated age.  Head: Normocephalic, without obvious abnormality, atraumatic. Eyes: Bilateral inner canthus scabbing left more than right Blepharitis both eyelids lower crusting. ENT Nares normal. No drainage or sinus tenderness. Lips, mucosa, and tongue normal. No Thrush Scab over the philtrum Neck: Supple, symmetrical, no adenopathy, thyroid: non tender no carotid bruit and no JVD. Back: No CVA tenderness. Lungs: Clear to auscultation bilaterally. No Wheezing or Rhonchi. No rales. Heart: Regular rate and rhythm, no murmur, rub or gallop. Port on the right side   Abdomen: Soft, non-tender,not distended. Bowel sounds normal. No masses Extremities: atraumatic, no cyanosis. No edema. No clubbing Skin: No rashes or lesions. Or bruising Lymph: Cervical, supraclavicular normal. Neurologic: Grossly non-focal Pertinent Labs Lab Results CBC    Component Value Date/Time   WBC 1.1 (LL) 06/18/2021 0717   RBC 2.98 (L) 06/18/2021 0717   HGB 9.6 (L) 06/18/2021 0717   HCT 27.0 (L) 06/18/2021 0717   PLT 68 (L) 06/18/2021 0717   MCV 90.6 06/18/2021 0717   MCH 32.2 06/18/2021 0717   MCHC 35.6 06/18/2021 0717   RDW 13.8 06/18/2021 0717   LYMPHSABS 0.1 (L) 06/17/2021 1104   MONOABS 0.1 06/17/2021 1104   EOSABS 0.0 06/17/2021 1104   BASOSABS 0.0 06/17/2021 1104    CMP Latest Ref Rng & Units 06/18/2021 06/17/2021 06/12/2021  Glucose 70 - 99 mg/dL 152(H) 212(H) 169(H)  BUN 8 - 23 mg/dL 12 12 27(H)  Creatinine 0.44 - 1.00 mg/dL 0.52 0.61 0.46  Sodium 135 -  145 mmol/L 131(L) 126(L) 128(L)  Potassium 3.5 - 5.1 mmol/L 3.2(L) 2.7(LL) 3.4(L)  Chloride 98 - 111 mmol/L 94(L)  88(L) 96(L)  CO2 22 - 32 mmol/L '23 25 24  ' Calcium 8.9 - 10.3 mg/dL 8.4(L) 8.5(L) 8.0(L)  Total Protein 6.5 - 8.1 g/dL - 7.4 6.1(L)  Total Bilirubin 0.3 - 1.2 mg/dL - 0.9 0.6  Alkaline Phos 38 - 126 U/L - 134(H) 63  AST 15 - 41 U/L - 32 27  ALT 0 - 44 U/L - 63(H) 26      Microbiology: Recent Results (from the past 240 hour(s))  Culture, blood (routine x 2)     Status: None (Preliminary result)   Collection Time: 06/17/21 11:04 AM   Specimen: BLOOD  Result Value Ref Range Status   Specimen Description   Final    BLOOD  LEFT AC Performed at [facility], [address], [place], [place] [address]    Special Requests   Final    BOTTLES DRAWN AEROBIC AND ANAEROBIC Blood Culture adequate volume Performed at [facility], [address] [place], [place] [address]    Culture  Setup Time GRAM NEGATIVE RODS AEROBIC BOTTLE ONLY   Final   Culture GRAM NEGATIVE RODS  Final   Report Status PENDING  Incomplete  Culture, blood (routine x 2)     Status: None (Preliminary result)   Collection Time: 06/17/21 11:04 AM   Specimen: BLOOD  Result Value Ref Range Status   Specimen Description   Final    BLOOD RIGHT WRIST Performed at [facility], [address], [place], [place] [address]    Special Requests   Final    BOTTLES DRAWN AEROBIC AND ANAEROBIC Blood Culture results may not be optimal due to an inadequate volume of blood received in culture bottles Performed at [facility], [address] [place], [place] [address]    Culture  Setup Time   Final    GRAM NEGATIVE RODS IN BOTH AEROBIC AND ANAEROBIC BOTTLES Organism ID to follow CRITICAL RESULT CALLED TO, READ BACK BY AND VERIFIED WITH: JASON ROBINS '@[id number]'  06/18/2021 LFD    Culture GRAM NEGATIVE RODS  Final   Report Status PENDING  Incomplete  Blood Culture ID Panel (Reflexed)     Status: Abnormal   Collection Time: 06/17/21 11:04 AM  Result Value Ref Range Status   Enterococcus  faecalis NOT DETECTED NOT DETECTED Final   Enterococcus Faecium NOT DETECTED NOT DETECTED Final   Listeria monocytogenes NOT DETECTED NOT DETECTED Final   Staphylococcus species NOT DETECTED NOT DETECTED Final   Staphylococcus aureus (BCID) NOT DETECTED NOT DETECTED Final   Staphylococcus epidermidis NOT DETECTED NOT DETECTED Final   Staphylococcus lugdunensis NOT DETECTED NOT DETECTED Final   Streptococcus species NOT DETECTED NOT DETECTED Final   Streptococcus agalactiae NOT DETECTED NOT DETECTED Final   Streptococcus pneumoniae NOT DETECTED NOT DETECTED Final   Streptococcus pyogenes NOT DETECTED NOT DETECTED Final   A.calcoaceticus-baumannii NOT DETECTED NOT DETECTED Final   Bacteroides fragilis NOT DETECTED NOT DETECTED Final   Enterobacterales NOT DETECTED NOT DETECTED Final   Enterobacter cloacae complex NOT DETECTED NOT DETECTED Final   Escherichia coli NOT DETECTED NOT DETECTED Final   Klebsiella aerogenes NOT DETECTED NOT DETECTED Final   Klebsiella oxytoca NOT DETECTED NOT DETECTED Final   Klebsiella pneumoniae NOT DETECTED NOT DETECTED Final   Proteus species NOT DETECTED NOT DETECTED Final   Salmonella species NOT DETECTED NOT DETECTED Final   Serratia  marcescens NOT DETECTED NOT DETECTED Final   Haemophilus influenzae NOT DETECTED NOT DETECTED Final   Neisseria meningitidis NOT DETECTED NOT DETECTED Final   Pseudomonas aeruginosa DETECTED (A) NOT DETECTED Final    Comment: CRITICAL RESULT CALLED TO, READ BACK BY AND VERIFIED WITH: JASON ROBINS @ 0202 06/18/2021 LFD    Stenotrophomonas maltophilia NOT DETECTED NOT DETECTED Final   Candida albicans NOT DETECTED NOT DETECTED Final   Candida auris NOT DETECTED NOT DETECTED Final   Candida glabrata NOT DETECTED NOT DETECTED Final   Candida krusei NOT DETECTED NOT DETECTED Final   Candida parapsilosis NOT DETECTED NOT DETECTED Final   Candida tropicalis NOT DETECTED NOT DETECTED Final   Cryptococcus neoformans/gattii NOT  DETECTED NOT DETECTED Final   CTX-M ESBL NOT DETECTED NOT DETECTED Final   Carbapenem resistance IMP NOT DETECTED NOT DETECTED Final   Carbapenem resistance KPC NOT DETECTED NOT DETECTED Final   Carbapenem resistance NDM NOT DETECTED NOT DETECTED Final   Carbapenem resistance VIM NOT DETECTED NOT DETECTED Final    Comment: Performed at [facility], [address] [place], [place] [address]  Resp Panel by RT-PCR (Flu A&B, Covid) Nasopharyngeal Swab     Status: None   Collection Time: 06/17/21  1:00 PM   Specimen: Nasopharyngeal Swab; Nasopharyngeal(NP) swabs in vial transport medium  Result Value Ref Range Status   SARS Coronavirus 2 by RT PCR NEGATIVE NEGATIVE Final    Comment: (NOTE) SARS-CoV-2 target nucleic acids are NOT DETECTED.  The SARS-CoV-2 RNA is generally detectable in upper respiratory specimens during the acute phase of infection. The lowest concentration of SARS-CoV-2 viral copies this assay can detect is 138 copies/mL. A negative result does not preclude SARS-Cov-2 infection and should not be used as the sole basis for treatment or other patient management decisions. A negative result may occur with  improper specimen collection/handling, submission of specimen other than nasopharyngeal swab, presence of viral mutation(s) within the areas targeted by this assay, and inadequate number of viral copies(<138 copies/mL). A negative result must be combined with clinical observations, patient history, and epidemiological information. The expected result is Negative.  Fact Sheet for Patients:  EntrepreneurPulse.com.au  Fact Sheet for Healthcare Providers:  IncredibleEmployment.be  This test is no t yet approved or cleared by the [place] FDA and  has been authorized for detection and/or diagnosis of SARS-CoV-2 by FDA under an Emergency Use Authorization (EUA). This EUA will remain  in effect (meaning this test can be  used) for the duration of the COVID-19 declaration under Section 564(b)(1) of the Act, 21 U.S.C.section 360bbb-3(b)(1), unless the authorization is terminated  or revoked sooner.       Influenza A by PCR NEGATIVE NEGATIVE Final   Influenza B by PCR NEGATIVE NEGATIVE Final    Comment: (NOTE) The Xpert Xpress SARS-CoV-2/FLU/RSV plus assay is intended as an aid in the diagnosis of influenza from Nasopharyngeal swab specimens and should not be used as a sole basis for treatment. Nasal washings and aspirates are unacceptable for Xpert Xpress SARS-CoV-2/FLU/RSV testing.  Fact Sheet for Patients: EntrepreneurPulse.com.au  Fact Sheet for Healthcare Providers: IncredibleEmployment.be  This test is not yet approved or cleared by the [place] FDA and has been authorized for detection and/or diagnosis of SARS-CoV-2 by FDA under an Emergency Use Authorization (EUA). This EUA will remain in effect (meaning this test can be used) for the duration of the COVID-19 declaration under Section 564(b)(1) of the Act, 21 U.S.C. section 360bbb-3(b)(1), unless the authorization is  terminated or revoked.  Performed at [facility], [address], [place], [place] [address]     IMAGING RESULTS: CT maxilla reviewed.  Report as above. I have personally reviewed the films ? Impression/Recommendation Metastatic stage IV small cell lung cancer of the left lower lobe with brain mets and adrenal mets Status post brain radiation Getting chemotherapy.  Started cycle in July.  06/05/2021 received topside and carboplatin.  Also got PD-L1 inhibitor Tecentriq's.  Pseudomonas bacteremia Neutropenia with thrombocytopenia secondary to chemotherapy Bilateral conjunctivitis/blepharitis with scab on the inner eyes with preseptal cellulitis.  Plan above the upper lip there is eschar.  Is this secondary to Pseudomonas or is it related to herpes. Currently on cefepime  and vancomycin Will discontinue vancomycin.  Discussed the management with the patient and her husband.   ? ? ___________________________________________________ Discussed with patient, requesting provider Note:  This document was prepared using Dragon voice recognition software and may include unintentional dictation errors.

## 2021-06-18 NOTE — Progress Notes (Signed)
PHARMACY - PHYSICIAN COMMUNICATION CRITICAL VALUE ALERT - BLOOD CULTURE IDENTIFICATION (BCID)  Cathy Glover is an 65 y.o. female who presented to [facility] on 06/17/2021 with a chief complaint of sepsis.   Assessment:  Pseudomonas in 2 of 4 bottles (aerobic) , no resistance (include suspected source if known)  Name of physician (or Provider) Contacted: Prudy Feeler  Current antibiotics: Vanc, Cefepime   Changes to prescribed antibiotics recommended:  MD wants to continue current regimen due to [place] ... will defer judgement to morning MD .   Results for orders placed or performed during the hospital encounter of 06/17/21  Blood Culture ID Panel (Reflexed) (Collected: 06/17/2021 11:04 AM)  Result Value Ref Range   Enterococcus faecalis NOT DETECTED NOT DETECTED   Enterococcus Faecium NOT DETECTED NOT DETECTED   Listeria monocytogenes NOT DETECTED NOT DETECTED   Staphylococcus species NOT DETECTED NOT DETECTED   Staphylococcus aureus (BCID) NOT DETECTED NOT DETECTED   Staphylococcus epidermidis NOT DETECTED NOT DETECTED   Staphylococcus lugdunensis NOT DETECTED NOT DETECTED   Streptococcus species NOT DETECTED NOT DETECTED   Streptococcus agalactiae NOT DETECTED NOT DETECTED   Streptococcus pneumoniae NOT DETECTED NOT DETECTED   Streptococcus pyogenes NOT DETECTED NOT DETECTED   A.calcoaceticus-baumannii NOT DETECTED NOT DETECTED   Bacteroides fragilis NOT DETECTED NOT DETECTED   Enterobacterales NOT DETECTED NOT DETECTED   Enterobacter cloacae complex NOT DETECTED NOT DETECTED   Escherichia coli NOT DETECTED NOT DETECTED   Klebsiella aerogenes NOT DETECTED NOT DETECTED   Klebsiella oxytoca NOT DETECTED NOT DETECTED   Klebsiella pneumoniae NOT DETECTED NOT DETECTED   Proteus species NOT DETECTED NOT DETECTED   Salmonella species NOT DETECTED NOT DETECTED   Serratia marcescens NOT DETECTED NOT DETECTED   Haemophilus influenzae NOT DETECTED NOT DETECTED   Neisseria meningitidis NOT  DETECTED NOT DETECTED   Pseudomonas aeruginosa DETECTED (A) NOT DETECTED   Stenotrophomonas maltophilia NOT DETECTED NOT DETECTED   Candida albicans NOT DETECTED NOT DETECTED   Candida auris NOT DETECTED NOT DETECTED   Candida glabrata NOT DETECTED NOT DETECTED   Candida krusei NOT DETECTED NOT DETECTED   Candida parapsilosis NOT DETECTED NOT DETECTED   Candida tropicalis NOT DETECTED NOT DETECTED   Cryptococcus neoformans/gattii NOT DETECTED NOT DETECTED   CTX-M ESBL NOT DETECTED NOT DETECTED   Carbapenem resistance IMP NOT DETECTED NOT DETECTED   Carbapenem resistance KPC NOT DETECTED NOT DETECTED   Carbapenem resistance NDM NOT DETECTED NOT DETECTED   Carbapenem resistance VIM NOT DETECTED NOT DETECTED    Shir Bergman D 06/18/2021  2:13 AM

## 2021-06-18 NOTE — Consult Note (Signed)
[facility] NOTE  Patient Care Team: Pcp, No as PCP - General Telford Nab, RN as Oncology Nurse Navigator  CHIEF COMPLAINTS/PURPOSE OF CONSULTATION: Neutropenic sepsis/lung cancer on chemotherapy  HISTORY OF PRESENTING ILLNESS: History mostly from the patient's husband at the bedside. Cathy Glover 65 y.o.  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  As per the husband patient has been feeling weak for the last 3 to 4 days.  Poor p.o. intake.  Positive for chills.  No high-grade temperature.  No nausea or vomiting.  However-constipation.  No abdominal pain.  No new shortness of breath or cough.  Of note-patient was noted to have significant bilateral orbital swelling left more than right with mucus secretion-causing the eyes to mat.  The emergency room patient noted to have severe neutropenia; anemia hemoglobin ~9; platelets in the 60s.  CT of the orbits-moderate swelling/thickening of the preseptal soft tissue bilaterally left more than right no fluid collection or postoperative inflammation.  Chronically opacified fluid in the right middle ear cavity.  Also noted to have soft tissue swelling of the upper lip; can also incidental subocclusive thrombus of the right internal jugular vein.  Of note-incidentally improved brain metastases; neck lymphadenopathy noted.  Chest x-ray shows no acute process.  UA-many bacteria; however WBC negative.  Blood culture positive for Pseudomonas aeruginosa.   Review of Systems  Unable to perform ROS: Critical illness    MEDICAL HISTORY:  Past Medical History:  Diagnosis Date   Cancer [facility])    Pulmonary nodule     SURGICAL HISTORY: Past Surgical History:  Procedure Laterality Date   IR IMAGING GUIDED PORT INSERTION  05/11/2021   VIDEO  BRONCHOSCOPY WITH ENDOBRONCHIAL ULTRASOUND N/A 04/25/2021   Procedure: VIDEO BRONCHOSCOPY WITH ENDOBRONCHIAL ULTRASOUND;  Surgeon: Tyler Pita, MD;  Location: [facility] ORS;  Service: Cardiopulmonary;  Laterality: N/A;    SOCIAL HISTORY: Social History   Socioeconomic History   Marital status: Married    Spouse name: Not on file   Number of children: Not on file   Years of education: Not on file   Highest education level: Not on file  Occupational History   Not on file  Tobacco Use   Smoking status: Former    Packs/day: 0.50    Years: 50.00    Pack years: 25.00    Types: Cigarettes    Quit date: 04/11/2021    Years since quitting: 0.1   Smokeless tobacco: Never   Tobacco comments:    Quit one month ago  Vaping Use   Vaping Use: Never used  Substance and Sexual Activity   Alcohol use: Never   Drug use: Never   Sexual activity: Not on file  Other Topics Concern   Not on file  Social History Narrative   Lives in [place] with husband; 2 grown sons; quit smoking- June 2022. No alcohol.    Social Determinants of Health   Financial Resource Strain: Not on file  Food Insecurity: Not on file  Transportation Needs: Not on file  Physical Activity: Not on file  Stress: Not on file  Social Connections: Not on file  Intimate Partner Violence: Not on file    FAMILY HISTORY: Family History  Problem Relation Age of Onset   Cancer Sister     ALLERGIES:  has No Known Allergies.  MEDICATIONS:  Current Facility-Administered Medications  Medication Dose Route Frequency Provider Last Rate Last Admin   acetaminophen (TYLENOL) tablet 650 mg  650 mg Oral Q6H PRN Agbata, Tochukwu, MD       Or   acetaminophen (TYLENOL) suppository 650 mg  650 mg Rectal Q6H PRN Agbata, Tochukwu, MD       budesonide (PULMICORT) nebulizer solution 0.25 mg  0.25 mg Nebulization BID Dorothe Pea, RPH   0.25 mg at 06/18/21 1945   ceFEPIme (MAXIPIME) 2 g in sodium chloride 0.9 % 100 mL IVPB  2 g  Intravenous Q12H Dorothe Pea, RPH   Stopped at 06/18/21 1249   dexamethasone (DECADRON) tablet 4 mg  4 mg Oral BID Agbata, Tochukwu, MD   4 mg at 06/18/21 1010   dextromethorphan-guaiFENesin (MUCINEX DM) 30-600 MG per 12 hr tablet 1 tablet  1 tablet Oral BID Agbata, Tochukwu, MD   1 tablet at 06/18/21 2050   iohexol (OMNIPAQUE) 350 MG/ML injection 75 mL  75 mL Intravenous Once PRN Nena Polio, MD       lactated ringers 1,000 mL with potassium chloride 40 mEq infusion   Intravenous Continuous Nolberto Hanlon, MD 75 mL/hr at 06/18/21 1442 Infusion Verify at 06/18/21 1442   moxifloxacin (VIGAMOX) 0.5 % ophthalmic solution 1 drop  1 drop Both Eyes TID Porfilio, Gwyndolyn Saxon, MD       ondansetron [facility]) tablet 4 mg  4 mg Oral Q6H PRN Agbata, Tochukwu, MD   4 mg at 06/17/21 2207   Or   ondansetron (ZOFRAN) injection 4 mg  4 mg Intravenous Q6H PRN Agbata, Tochukwu, MD       potassium chloride (KLOR-CON) packet 40 mEq  40 mEq Oral Daily Nena Polio, MD   40 mEq at 06/18/21 1010   potassium chloride SA (KLOR-CON) CR tablet 40 mEq  40 mEq Oral Once Nolberto Hanlon, MD       Tbo-Filgrastim [facility]) injection 300 mcg  300 mcg Subcutaneous Daily Lynnwood Beckford R, MD       umeclidinium-vilanterol (ANORO ELLIPTA) 62.5-25 MCG/INH 1 puff  1 puff Inhalation Daily Dorothe Pea, RPH       umeclidinium-vilanterol (ANORO ELLIPTA) 62.5-25 MCG/INH 1 puff  1 puff Inhalation Once Nolberto Hanlon, MD       vancomycin (VANCOREADY) IVPB 750 mg/150 mL  750 mg Intravenous Q24H Dorothe Pea, RPH 75 mL/hr at 06/18/21 1442 Infusion Verify at 06/18/21 1442      .  PHYSICAL EXAMINATION:  Vitals:   06/18/21 1821 06/18/21 1948  BP: 126/81   Pulse: (!) 115   Resp: 18   Temp: 97.7 F (36.5 C)   SpO2: 100% 97%   Filed Weights   06/17/21 1105 06/18/21 1846  Weight: 103 lb 11.6 oz (47 kg) 109 lb 2 oz (49.5 kg)    Physical Exam Vitals and nursing note reviewed.  Constitutional:      Comments: Patient  resting in the bed.Nasal cannula oxygen.  She is accompanied by husband.    HENT:     Head: Normocephalic and atraumatic.     Mouth/Throat:     Mouth: Mucous membranes are moist.     Pharynx: No oropharyngeal exudate.  Eyes:     Comments: Eyes matted bilaterally; unable to open the eyes.  Bilateral orbital swelling noted left more than right.  Cardiovascular:     Rate and Rhythm: Tachycardia present.  Pulmonary:     Effort: No respiratory distress.     Breath sounds: No  wheezing.     Comments: Decreased breath sounds bilaterally at bases.  No wheeze or crackles Abdominal:     General: Bowel sounds are normal. There is no distension.     Palpations: Abdomen is soft. There is no mass.     Tenderness: There is no abdominal tenderness. There is no guarding or rebound.  Musculoskeletal:        General: No tenderness. Normal range of motion.     Cervical back: Normal range of motion and neck supple.  Skin:    General: Skin is warm.  Neurological:     Mental Status: She is alert and oriented to person, place, and time.  Psychiatric:        Mood and Affect: Affect normal.        Judgment: Judgment normal.     LABORATORY DATA:  I have reviewed the data as listed Lab Results  Component Value Date   WBC 1.1 (LL) 06/18/2021   HGB 9.6 (L) 06/18/2021   HCT 27.0 (L) 06/18/2021   MCV 90.6 06/18/2021   PLT 68 (L) 06/18/2021   Recent Labs    06/05/21 0946 06/12/21 1325 06/17/21 1104 06/18/21 0717  NA  --  128* 126* 131*  K  --  3.4* 2.7* 3.2*  CL  --  96* 88* 94*  CO2  --  24 25 23   GLUCOSE  --  169* 212* 152*  BUN  --  27* 12 12  CREATININE  --  0.46 0.61 0.52  CALCIUM  --  8.0* 8.5* 8.4*  GFRNONAA  --  >60 >60 >60  PROT 6.4* 6.1* 7.4  --   ALBUMIN 3.1* 2.8* 2.4*  --   AST 25 27 32  --   ALT 38 26 63*  --   ALKPHOS 57 63 134*  --   BILITOT 0.5 0.6 0.9  --   BILIDIR <0.1  --   --   --   IBILI NOT CALCULATED  --   --   --     RADIOGRAPHIC STUDIES: I have personally  reviewed the radiological images as listed and agreed with the findings in the report. US Venous Img Upper Uni Right(DVT)  Result Date: 06/18/2021 CLINICAL DATA:  Thrombus in right internal jugular vein. Right internal jugular Port-A-Cath. EXAM: RIGHT UPPER EXTREMITY VENOUS DOPPLER ULTRASOUND TECHNIQUE: Gray-scale sonography with graded compression, as well as color Doppler and duplex ultrasound were performed to evaluate the upper extremity deep venous system from the level of the subclavian vein and including the jugular, axillary, basilic, radial, ulnar and upper cephalic vein. Spectral Doppler was utilized to evaluate flow at rest and with distal augmentation maneuvers. COMPARISON:  CT 06/17/2021 FINDINGS: Contralateral Subclavian Vein: No evidence of thrombus. Normal color Doppler flow and phasicity. Internal Jugular Vein: Positive for thrombus. Echogenic nonocclusive thrombus in the right internal jugular vein compatible with recent CT findings. Subclavian Vein: Positive for thrombus. Evidence for nonocclusive thrombus in the proximal right subclavian region. Distal right subclavian vein has normal compressibility and color Doppler flow and augmentation. Axillary Vein: No evidence of thrombus. Normal compressibility and color Doppler flow. Cephalic Vein: Positive for thrombus. Small amount echogenic thrombus and incomplete compressibility of the distal cephalic vein in the right upper arm. Basilic Vein: Negative for thrombus. Normal compressibility and color Doppler flow. Brachial Veins: Negative for thrombus. Normal compressibility and color Doppler flow. Radial Veins: Negative for thrombus. Normal compressibility and color Doppler flow. Ulnar Veins: Negative for thrombus. Normal compressibility and color  Doppler flow. Other Findings:  None visualized. IMPRESSION: 1. Positive for deep venous thrombosis involving the right internal jugular vein. This thrombus is nonocclusive. In addition, there is  nonocclusive thrombus in the proximal right subclavian vein probably near the right innominate vein junction. 2. Positive for superficial venous thrombosis in the right cephalic vein. Right cephalic vein thrombus is nonocclusive. Electronically Signed   By: Markus Daft M.D.   On: 06/18/2021 09:44   CT Maxillofacial W Contrast  Result Date: 06/17/2021 CLINICAL DATA:  Maxillofacial pain Swelling in the eyelids. History of metastatic lung cancer. EXAM: CT MAXILLOFACIAL WITH CONTRAST TECHNIQUE: Multidetector CT imaging of the maxillofacial structures was performed with intravenous contrast. Multiplanar CT image reconstructions were also generated. CONTRAST:  68mL OMNIPAQUE IOHEXOL 350 MG/ML SOLN COMPARISON:  Head MRI 05/04/2021.  PET-CT 05/03/2021. FINDINGS: Osseous: No acute fracture or destructive osseous process. Edentulous. Orbits: Moderate swelling/thickening of the preseptal soft tissues bilaterally, left greater than right. No fluid collection, postseptal inflammation, or intraorbital mass. Grossly intact globes. Sinuses: Small volume fluid in the left sphenoid sinus. Underdeveloped right mastoid air cells which are partially chronically opacified with persistent fluid in the right middle ear cavity as well. Clear left mastoid air cells. Soft tissues: Soft tissue swelling involving the upper lip. Partially visualized subocclusive thrombus in the right internal jugular vein in the mid and lower neck above a Port-A-Cath. Partially visualized lymphadenopathy in the left lower neck and left supraclavicular region with the largest node covered on today's study measuring 1.1 cm in short axis, overall smaller than on the prior PET-CT. Limited intracranial: Mild vasogenic edema in the right temporoparietal region, less than on the prior MRI and with the known underlying mass not well demonstrated on this maxillofacial CT. IMPRESSION: 1. Bilateral periorbital and upper lip soft tissue swelling. No evidence of postseptal  cellulitis or abscess. 2. Partially visualized subocclusive thrombus in the right internal jugular vein above a Port-A-Cath. 3. Partially visualized left lower neck and left supraclavicular lymphadenopathy, likely improved from the prior PET-CT. 4. Decreased right temporoparietal vasogenic edema. Electronically Signed   By: Logan Bores M.D.   On: 06/17/2021 13:04   DG Chest Portable 1 View  Result Date: 06/17/2021 CLINICAL DATA:  Sepsis, lung cancer, ongoing chemotherapy EXAM: PORTABLE CHEST 1 VIEW COMPARISON:  04/12/2021 FINDINGS: The heart size and mediastinal contours are within normal limits. Right chest port catheter. Irregular and nodular opacity projecting the perihilar right lung and right lung base, in keeping with known primary lung malignancy. No new or acute appearing airspace disease. The visualized skeletal structures are unremarkable. IMPRESSION: Irregular and nodular opacity projecting the perihilar right lung and right lung base, in keeping with known primary lung malignancy. No new or acute appearing airspace disease. Electronically Signed   By: Eddie Candle M.D.   On: 06/17/2021 11:46   DG Fluoro Guide CV Line Right  Result Date: 06/06/2021 CLINICAL DATA:  Small-cell lung carcinoma. No blood return from port a cath, placed 05/11/2021 by Dr. Earleen Newport. EXAM: PORT  CATHETER INJECTION UNDER FLUOROSCOPY TECHNIQUE: The procedure, risks (including but not limited to bleeding, infection, organ damage ), benefits, and alternatives were explained to the patient. Questions regarding the procedure were encouraged and answered. The patient understands and consents to the procedure. Survey fluoroscopic inspection reveals stable position of right IJ power port, tip at the cavoatrial junction. I was able to easily aspirate blood from the access needle which had been previously placed. Injection demonstrates patency of the reservoir and tubing. No  leak. Contrast flows freely from the tip of the catheter into  the SVC. No evidence of venous thrombosis or significant fibrin sheath. IMPRESSION: 1. Normal port injection.  Okay for routine use. Consider CathFlow per protocol if aspiration problems persist, as small incomplete fibrin sheaths may be inapparent on injection. Electronically Signed   By: Lucrezia Europe M.D.   On: 06/06/2021 11:40    Neutropenic sepsis [facility]) #65 year old female patient with Stage IV/extensive stage small cell lung cancer-metastasis to brain/; status post whole brain radiation; s/p chemotherapy is currently admitted to hospital for neutropenic sepsis.   #Metastatic small cell lung cancer- with metastasis to the right hilum/supraclavicular/subpectoral/axillary adenopathy; adrenal metastases and pelvic adenopathy.  June 2022 brain MRI-metastasis. S/p  carboplatin etoposide approximately 10 days ago.  Given the neutropenia-recommend Granix on a daily basis.  #Sepsis/bacteremia-Pseudomonas-source of infection is unclear.  CT of the orbits-negative for any obvious cellulitis.  On broad-spectrum antibiotics-awaiting ID evaluation.   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 ].  Dexamethasone 1 mg twice a day; CT of the orbits-incidental improvement of the brain metastasis.  # hyponatremia- 129; dehydration/SIADH from underlying malignancy-continue IV fluids.  #Incidental nonocclusive thrombus is noted at the right IJ-upper extremity venous Dopplers.  However hold anticoagulation given thrombocytopenia.  However trending up-consider anticoagulation.  Thank you Dr.Agbata for allowing me to participate in the care of your pleasant patient. Please do not hesitate to contact me with questions or concerns in the interim.  The above plan of care was discussed with the patient's husband at the bedside.  # I reviewed the blood work- with the patient in detail; also reviewed the imaging independently [as summarized above]; and with the patient  in detail.   All questions were answered. The patient knows to call the clinic with any problems, questions or concerns.    Cammie Sickle, MD 06/18/2021 9:01 PM

## 2021-06-18 NOTE — ED Notes (Signed)
US at bedside

## 2021-06-18 NOTE — ED Notes (Signed)
Cleaned patient up and placed a purewick in place

## 2021-06-18 NOTE — ED Notes (Addendum)
Pt provided warm blankets, stretcher locked in lowest position. Family at bedside  Pt responsive to verbal stimuli, falling asleep quickly. Pt appears to be having difficulty swallowing pills, drink/hard time comprehending. Bilateral eyes continue to be swollen.  Was not able to administer all medications- Dr Kurtis Bushman messaged via secure chat to notify, Dr Kurtis Bushman aware, to continue anitbiotics

## 2021-06-18 NOTE — Assessment & Plan Note (Addendum)
#  65 year old female patient with Stage IV/extensive stage small cell lung cancer-metastasis to brain/; status post whole brain radiation; s/p chemotherapy is currently admitted to hospital for neutropenic sepsis.  #Metastatic small cell lung cancer- with metastasis to the right hilum/supraclavicular/subpectoral/axillary adenopathy; adrenal metastases and pelvic adenopathy/ brain MRI-metastasis. S/p  carboplatin etoposide approximately 14 days ago.  Given the neutropenia-s/p Granix.  # Sepsis/bacteremia-Pseudomonas-source of infection is unclear.  CT of the orbits-negative for any obvious cellulitis-cefepime.  Patient planned to be discharged on oral ciprofloxacin 750 twice daily for total of 14 days.  See discussion regarding tendon rupture.   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 ].  Will decrease the dose of dexamethasone to 1 mg oral twice a day-given the risk of tendon rupture/especially on quinolones.  #Incidental nonocclusive thrombus is noted at the right IJ-upper extremity venous Dopplers.  Platelets 90,000 trending up-start Eliquis 5 mg twice daily.  #.  Plan to hold chemotherapy next week; keep appointment as planned/next week and [place].  Discussed with hospitalist/ID doctor.

## 2021-06-18 NOTE — Consult Note (Signed)
Subjective: 2 day history of redness and swelling of lids. Started OS then involved OD. SOme blurriness OU. No pain. Husband believes the orolabial lesion is a " fever blister.  Objective: Vital signs in last 24 hours: Pulse Rate:  [25-114] 96 (08/08 1530) Resp:  [11-30] 13 (08/08 1530) BP: (112-139)/(72-92) 132/86 (08/08 1530) SpO2:  [23 %-100 %] 95 % (08/08 1530)    Exam: Lids matted shut OU. Moderate erythema and edema upper and lower lids OU. Mildly tender. Eschar in left upper lid nasally.  Lids gently separated: Near [place] with readers 20/30 OD  20/50 OS  Globes soft to palpation. Motility normal. Pupils normal OU Anterior segment exam: No corneal lesions OU   Moderate thick discharge OU. Palpebral conj. Inflammation OU Quiet AC OU.  CT scan reviewed.   Recent Labs    06/17/21 1104 06/18/21 0717  WBC 0.8* 1.1*  HGB 9.7* 9.6*  HCT 27.1* 27.0*  NA 126* 131*  K 2.7* 3.2*  CL 88* 94*  CO2 25 23  BUN 12 12  CREATININE 0.61 0.52    Studies/Results: US Venous Img Upper Uni Right(DVT)  Result Date: 06/18/2021 CLINICAL DATA:  Thrombus in right internal jugular vein. Right internal jugular Port-A-Cath. EXAM: RIGHT UPPER EXTREMITY VENOUS DOPPLER ULTRASOUND TECHNIQUE: Gray-scale sonography with graded compression, as well as color Doppler and duplex ultrasound were performed to evaluate the upper extremity deep venous system from the level of the subclavian vein and including the jugular, axillary, basilic, radial, ulnar and upper cephalic vein. Spectral Doppler was utilized to evaluate flow at rest and with distal augmentation maneuvers. COMPARISON:  CT 06/17/2021 FINDINGS: Contralateral Subclavian Vein: No evidence of thrombus. Normal color Doppler flow and phasicity. Internal Jugular Vein: Positive for thrombus. Echogenic nonocclusive thrombus in the right internal jugular vein compatible with recent CT findings. Subclavian Vein: Positive for thrombus. Evidence for nonocclusive  thrombus in the proximal right subclavian region. Distal right subclavian vein has normal compressibility and color Doppler flow and augmentation. Axillary Vein: No evidence of thrombus. Normal compressibility and color Doppler flow. Cephalic Vein: Positive for thrombus. Small amount echogenic thrombus and incomplete compressibility of the distal cephalic vein in the right upper arm. Basilic Vein: Negative for thrombus. Normal compressibility and color Doppler flow. Brachial Veins: Negative for thrombus. Normal compressibility and color Doppler flow. Radial Veins: Negative for thrombus. Normal compressibility and color Doppler flow. Ulnar Veins: Negative for thrombus. Normal compressibility and color Doppler flow. Other Findings:  None visualized. IMPRESSION: 1. Positive for deep venous thrombosis involving the right internal jugular vein. This thrombus is nonocclusive. In addition, there is nonocclusive thrombus in the proximal right subclavian vein probably near the right innominate vein junction. 2. Positive for superficial venous thrombosis in the right cephalic vein. Right cephalic vein thrombus is nonocclusive. Electronically Signed   By: Markus Daft M.D.   On: 06/18/2021 09:44   CT Maxillofacial W Contrast  Result Date: 06/17/2021 CLINICAL DATA:  Maxillofacial pain Swelling in the eyelids. History of metastatic lung cancer. EXAM: CT MAXILLOFACIAL WITH CONTRAST TECHNIQUE: Multidetector CT imaging of the maxillofacial structures was performed with intravenous contrast. Multiplanar CT image reconstructions were also generated. CONTRAST:  78mL OMNIPAQUE IOHEXOL 350 MG/ML SOLN COMPARISON:  Head MRI 05/04/2021.  PET-CT 05/03/2021. FINDINGS: Osseous: No acute fracture or destructive osseous process. Edentulous. Orbits: Moderate swelling/thickening of the preseptal soft tissues bilaterally, left greater than right. No fluid collection, postseptal inflammation, or intraorbital mass. Grossly intact globes. Sinuses:  Small volume fluid in the left sphenoid  sinus. Underdeveloped right mastoid air cells which are partially chronically opacified with persistent fluid in the right middle ear cavity as well. Clear left mastoid air cells. Soft tissues: Soft tissue swelling involving the upper lip. Partially visualized subocclusive thrombus in the right internal jugular vein in the mid and lower neck above a Port-A-Cath. Partially visualized lymphadenopathy in the left lower neck and left supraclavicular region with the largest node covered on today's study measuring 1.1 cm in short axis, overall smaller than on the prior PET-CT. Limited intracranial: Mild vasogenic edema in the right temporoparietal region, less than on the prior MRI and with the known underlying mass not well demonstrated on this maxillofacial CT. IMPRESSION: 1. Bilateral periorbital and upper lip soft tissue swelling. No evidence of postseptal cellulitis or abscess. 2. Partially visualized subocclusive thrombus in the right internal jugular vein above a Port-A-Cath. 3. Partially visualized left lower neck and left supraclavicular lymphadenopathy, likely improved from the prior PET-CT. 4. Decreased right temporoparietal vasogenic edema. Electronically Signed   By: Logan Bores M.D.   On: 06/17/2021 13:04   DG Chest Portable 1 View  Result Date: 06/17/2021 CLINICAL DATA:  Sepsis, lung cancer, ongoing chemotherapy EXAM: PORTABLE CHEST 1 VIEW COMPARISON:  04/12/2021 FINDINGS: The heart size and mediastinal contours are within normal limits. Right chest port catheter. Irregular and nodular opacity projecting the perihilar right lung and right lung base, in keeping with known primary lung malignancy. No new or acute appearing airspace disease. The visualized skeletal structures are unremarkable. IMPRESSION: Irregular and nodular opacity projecting the perihilar right lung and right lung base, in keeping with known primary lung malignancy. No new or acute appearing  airspace disease. Electronically Signed   By: Eddie Candle M.D.   On: 06/17/2021 11:46    Medications:   Assessment/Plan:  1)  Preseptal cellulitis with bacterial conjunctivitis OU.   Cannot rule out reaction to chemotherapy.  Continue IV abx.   Add Vigamox  1 gtt tid OU for 2 weeks. Add warm compress and lids scrubs BID for 3-4 days.  Pt may follow up at the [facility] as  outpatient.  LOS: 1 day   Birder Robson 8/8/20225:32 PM

## 2021-06-19 ENCOUNTER — Inpatient Hospital Stay: Payer: 59

## 2021-06-19 DIAGNOSIS — R509 Fever, unspecified: Secondary | ICD-10-CM

## 2021-06-19 DIAGNOSIS — D709 Neutropenia, unspecified: Secondary | ICD-10-CM | POA: Diagnosis not present

## 2021-06-19 DIAGNOSIS — A419 Sepsis, unspecified organism: Secondary | ICD-10-CM | POA: Diagnosis not present

## 2021-06-19 LAB — CBC
HCT: 23.5 % — ABNORMAL LOW (ref 36.0–46.0)
Hemoglobin: 8.3 g/dL — ABNORMAL LOW (ref 12.0–15.0)
MCH: 31.8 pg (ref 26.0–34.0)
MCHC: 35.3 g/dL (ref 30.0–36.0)
MCV: 90 fL (ref 80.0–100.0)
Platelets: 72 10*3/uL — ABNORMAL LOW (ref 150–400)
RBC: 2.61 MIL/uL — ABNORMAL LOW (ref 3.87–5.11)
RDW: 14.2 % (ref 11.5–15.5)
WBC: 2 10*3/uL — ABNORMAL LOW (ref 4.0–10.5)
nRBC: 0 % (ref 0.0–0.2)

## 2021-06-19 LAB — BASIC METABOLIC PANEL
Anion gap: 6 (ref 5–15)
BUN: 19 mg/dL (ref 8–23)
CO2: 24 mmol/L (ref 22–32)
Calcium: 8.2 mg/dL — ABNORMAL LOW (ref 8.9–10.3)
Chloride: 102 mmol/L (ref 98–111)
Creatinine, Ser: 0.62 mg/dL (ref 0.44–1.00)
GFR, Estimated: >60 mL/min (ref >60)
Glucose, Bld: 172 mg/dL — ABNORMAL HIGH (ref 70–99)
Potassium: 3.2 mmol/L — ABNORMAL LOW (ref 3.5–5.1)
Sodium: 132 mmol/L — ABNORMAL LOW (ref 135–145)

## 2021-06-19 LAB — URINE CULTURE

## 2021-06-19 MED ORDER — POTASSIUM CHLORIDE CRYS ER 20 MEQ PO TBCR
40.0000 meq | EXTENDED_RELEASE_TABLET | Freq: Once | ORAL | Status: AC
  Administered 2021-06-19: 40 meq via ORAL
  Filled 2021-06-19 (×2): qty 2

## 2021-06-19 NOTE — Progress Notes (Signed)
   06/18/21 2209  Assess: MEWS Score  Temp 97.8 F (36.6 C)  BP 123/81  Pulse Rate (!) 101  Resp 18  Level of Consciousness Alert  SpO2 97 %  O2 Device Room Air  Assess: MEWS Score  MEWS Temp 0  MEWS Systolic 0  MEWS Pulse 1  MEWS RR 0  MEWS LOC 0  MEWS Score 1  MEWS Score Color Green  Assess: if the MEWS score is Yellow or Red  Were vital signs taken at a resting state? Yes  Focused Assessment No change from prior assessment  Does the patient meet 2 or more of the SIRS criteria? No  MEWS guidelines implemented *See Row Information* No, previously yellow, continue vital signs every 4 hours  Treat  Pain Scale 0-10  Pain Score 0  Escalate  MEWS: Escalate Yellow: discuss with charge nurse/RN and consider discussing with provider and RRT  Notify: Charge Nurse/RN  Name of Charge Nurse/RN Notified Olivia Mackie, RN  Date Charge Nurse/RN Notified 06/18/21  Time Charge Nurse/RN Notified 2209  Assess: SIRS CRITERIA  SIRS Temperature  0  SIRS Pulse 1  SIRS Respirations  0  SIRS WBC 0  SIRS Score Sum  1

## 2021-06-19 NOTE — Plan of Care (Signed)

## 2021-06-19 NOTE — Progress Notes (Signed)
    Durable Medical Equipment  (From admission, onward)           Start     Ordered   06/19/21 1439  For home use only DME Hospital bed  Once       Question Answer Comment  Length of Need Lifetime   The above medical condition requires: Patient requires the ability to reposition frequently   Head must be elevated greater than: 45 degrees   Bed type Semi-electric   Hoyer Lift Yes   Trapeze Bar Yes   Support Surface: Alternating Pressure Pad and Pump      06/19/21 1439

## 2021-06-19 NOTE — TOC Initial Note (Addendum)
Transition of Care [facility]) - Initial/Assessment Note    Patient Details  Name: Cathy Glover MRN: [id number] Date of Birth: 08/22/1956  [facility] CM/SW Contact:    Alberteen Sam, LCSW Phone Number: 06/19/2021, 1:17 PM  Clinical Narrative:                  CSW met with patient at bedside, discussed readmission risk assessment. Reports she has not set herself up with PCP, CSW asked her if she had any barriers and she said no. CSW explained to her that since she has insurance she can go to [facility] website to identify PCP in her area that are accepting patients, she reports she will do this.   Identifies no problems getting medications and no issues getting to and from appointments.   Patient reports she's from home with her husband. Reports she is interested in a hospital bed, MD to order then CSW can arrange.   No other needs identified at this time.   Expected Discharge Plan: Home/Self Care Barriers to Discharge: Continued Medical Work up   Patient Goals and CMS Choice Patient states their goals for this hospitalization and ongoing recovery are:: to go home CMS Medicare.gov Compare Post Acute Care list provided to:: Patient Choice offered to / list presented to : Patient  Expected Discharge Plan and Services Expected Discharge Plan: Home/Self Care       Living arrangements for the past 2 months: Single Family Home                                      Prior Living Arrangements/Services Living arrangements for the past 2 months: Single Family Home Lives with:: Spouse Patient language and need for interpreter reviewed:: Yes Do you feel safe going back to the place where you live?: Yes      Need for Family Participation in Patient Care: Yes (Comment) Care giver support system in place?: Yes (comment)   Criminal Activity/Legal Involvement Pertinent to Current Situation/Hospitalization: No - Comment as needed  Activities of Daily Living Home Assistive  Devices/Equipment: Cane (specify quad or straight) ADL Screening (condition at time of admission) Patient's cognitive ability adequate to safely complete daily activities?: Yes Is the patient deaf or have difficulty hearing?: No Does the patient have difficulty seeing, even when wearing glasses/contacts?: Yes Does the patient have difficulty concentrating, remembering, or making decisions?: Yes Patient able to express need for assistance with ADLs?: Yes Does the patient have difficulty dressing or bathing?: Yes Independently performs ADLs?: Yes (appropriate for developmental age) Does the patient have difficulty walking or climbing stairs?: Yes Weakness of Legs: None Weakness of Arms/Hands: None  Permission Sought/Granted Permission sought to share information with : Case Manager, Customer service manager, Family Supports Permission granted to share information with : Yes, Verbal Permission Granted  Share Information with NAME: [name]     Permission granted to share info w Relationship: spouse  Permission granted to share info w Contact Information: [phone]  Emotional Assessment Appearance:: Appears stated age Attitude/Demeanor/Rapport: Gracious Affect (typically observed): Calm Orientation: : Oriented to Self, Oriented to Place, Oriented to  Time, Oriented to Situation Alcohol / Substance Use: Not Applicable Psych Involvement: No (comment)  Admission diagnosis:  Hyponatremia [E87.1] Febrile neutropenia ([place]) [D70.9, R50.81] Preseptal cellulitis [L03.213] Acute thrombosis of right internal jugular vein ([place]) [B34.C11] Sepsis ([place]) [A41.9] Neutropenic sepsis ([place]) [A41.9, D70.9] Patient Active Problem List  Diagnosis Date Noted   Neutropenic sepsis ([place]) 06/17/2021   Hyponatremia 06/17/2021   Hypokalemia 06/17/2021   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

## 2021-06-19 NOTE — Progress Notes (Signed)
PROGRESS NOTE    Cathy Glover  OVZ:[id number] DOB: 12/25/1955 DOA: 06/17/2021 PCP: Pcp, No    Brief Narrative:  Cathy Glover is a 65 y.o. 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 Patient denies any trauma to her eyelids and denies having any pain in her eyes. Upon arrival to the ER she was noted to be groggy but had received Benadryl 25 mg x 1 dose due to concern for possible drug allergy. She has had diarrhea, 1 episode of emesis and poor oral intake but denies having any abdominal pain, no dysuria, no nocturia, no frequency, no dizziness, no lightheadedness, no headache, no chest pain, no shortness of breath, no focal deficits  Respiratory viral panel is negative Maxillofacial CT showed bilateral periorbital and upper lip soft tissue swelling. No evidence of postseptal cellulitis or abscess. Partially visualized subocclusive thrombus in the right internal jugular vein above a Port-A-Cath. Partially visualized left lower neck and left supraclavicular lymphadenopathy, likely improved from the prior PET-CT. Decreased right temporoparietal vasogenic edema. CXR shows irregular and nodular opacity projecting the perihilar right lung and right lung base, in keeping with known primary lung malignancy. No new or acute appearing airspace disease.  Patient admitted for periorbital cellulitis      Consultants:   Ophthalmology, ID, oncology  Procedures:   Antimicrobials:  Cefepime, moxifloxacin ophthalmic   Subjective: Husband at bedside.  She is able to open her eyes a little bit more today.  Still with sticky gooey stuff around her eyelids.  Swelling has improved but still quite swollen  and erythematous around the eyes.  Denies any other symptoms.  Objective: Vitals:   06/18/21 2209 06/19/21 0321 06/19/21 0742 06/19/21 0745  BP: 123/81 119/71  109/80  Pulse: (!) 101 96  96  Resp: 18 16  18   Temp: 97.8 F (36.6 C) 98.8 F (37.1 C)  (!) 96.8 F (36 C)  TempSrc:      SpO2: 97% 96% 98% 100%  Weight:      Height:        Intake/Output Summary (Last 24 hours) at 06/19/2021 0848 Last data filed at 06/18/2021 1442 Gross per 24 hour  Intake 346.34 ml  Output --  Net 346.34 ml   Filed Weights   06/17/21 1105 06/18/21 1846  Weight: 47 kg 49.5 kg    Examination: Calm, NAD HEENT: Bilateral eyelids swollen but somewhat improved since yesterday erythematous coloring around the eye areas able to open eyes less than halfway CTA no wheeze rales rhonchi's Regular S1-S2 no gallops Soft benign positive bowel sounds No edema Mood and affect appropriate in current setting   Data Reviewed: I have personally reviewed following labs and imaging studies  CBC: Recent Labs  Lab 06/12/21 1325 06/17/21 1104 06/18/21 0717 06/19/21 0133  WBC 1.2* 0.8* 1.1* 2.0*  NEUTROABS 0.7* 0.4*  --   --   HGB 11.4* 9.7* 9.6* 8.3*  HCT 32.1* 27.1* 27.0* 23.5*  MCV 88.7 88.9 90.6 90.0  PLT 109* 81* 68* 72*   Basic Metabolic Panel: Recent Labs  Lab 06/12/21 1325 06/17/21 1104 06/18/21 0717 06/19/21 0133  NA 128* 126* 131* 132*  K 3.4* 2.7* 3.2* 3.2*  CL 96* 88* 94* 102  CO2  24 25 23 24   GLUCOSE 169* 212* 152* 172*  BUN 27* 12 12 19   CREATININE 0.46 0.61 0.52 0.62  CALCIUM 8.0* 8.5* 8.4* 8.2*   GFR: Estimated Creatinine Clearance: 47.8 mL/min (by C-G formula based on SCr of 0.62 mg/dL). Liver Function Tests: Recent Labs  Lab 06/12/21 1325 06/17/21 1104  AST 27 32  ALT 26 63*  ALKPHOS 63 134*  BILITOT 0.6 0.9  PROT 6.1* 7.4  ALBUMIN 2.8* 2.4*   No results for input(s): LIPASE, AMYLASE in the last 168 hours. No results for input(s): AMMONIA in the last 168  hours. Coagulation Profile: Recent Labs  Lab 06/18/21 0717  INR 1.3*   Cardiac Enzymes: No results for input(s): CKTOTAL, CKMB, CKMBINDEX, TROPONINI in the last 168 hours. BNP (last 3 results) No results for input(s): PROBNP in the last 8760 hours. HbA1C: No results for input(s): HGBA1C in the last 72 hours. CBG: No results for input(s): GLUCAP in the last 168 hours. Lipid Profile: No results for input(s): CHOL, HDL, LDLCALC, TRIG, CHOLHDL, LDLDIRECT in the last 72 hours. Thyroid Function Tests: No results for input(s): TSH, T4TOTAL, FREET4, T3FREE, THYROIDAB in the last 72 hours. Anemia Panel: No results for input(s): VITAMINB12, FOLATE, FERRITIN, TIBC, IRON, RETICCTPCT in the last 72 hours. Sepsis Labs: Recent Labs  Lab 06/17/21 1104 06/17/21 1257 06/17/21 1524 06/17/21 2006 06/18/21 0717  PROCALCITON  --   --   --   --  2.30  LATICACIDVEN 2.1* 2.1* 2.7* 1.6  --     Recent Results (from the past 240 hour(s))  Culture, blood (routine x 2)     Status: None (Preliminary result)   Collection Time: 06/17/21 11:04 AM   Specimen: BLOOD  Result Value Ref Range Status   Specimen Description   Final    BLOOD  LEFT AC Performed at [facility], [address] [place], [place] [address]    Special Requests   Final    BOTTLES DRAWN AEROBIC AND ANAEROBIC Blood Culture adequate volume Performed at [facility], [address] [place], [place] [address]    Culture  Setup Time GRAM NEGATIVE RODS AEROBIC BOTTLE ONLY   Final   Culture GRAM NEGATIVE RODS  Final   Report Status PENDING  Incomplete  Culture, blood (routine x 2)     Status: Abnormal (Preliminary result)   Collection Time: 06/17/21 11:04 AM   Specimen: BLOOD  Result Value Ref Range Status   Specimen Description   Final    BLOOD RIGHT WRIST Performed at [facility], [address] [place], [place] [address]    Special Requests   Final    BOTTLES DRAWN AEROBIC AND ANAEROBIC Blood  Culture results may not be optimal due to an inadequate volume of blood received in culture bottles Performed at [facility], [address] [place], [place] [address]    Culture  Setup Time   Final    GRAM NEGATIVE RODS IN BOTH AEROBIC AND ANAEROBIC BOTTLES Organism ID to follow CRITICAL RESULT CALLED TO, READ BACK BY AND VERIFIED WITH: JASON ROBINS @0202  06/18/2021 LFD    Culture (A)  Final    PSEUDOMONAS AERUGINOSA SUSCEPTIBILITIES TO FOLLOW Performed at [facility], [place] [address] [place], [place] [address]    Report Status PENDING  Incomplete  Blood Culture ID Panel (Reflexed)     Status: Abnormal   Collection Time: 06/17/21 11:04 AM  Result Value Ref Range Status   Enterococcus faecalis NOT DETECTED  NOT DETECTED Final   Enterococcus Faecium NOT DETECTED NOT DETECTED Final   Listeria monocytogenes NOT DETECTED NOT DETECTED Final   Staphylococcus species NOT DETECTED NOT DETECTED Final   Staphylococcus aureus (BCID) NOT DETECTED NOT DETECTED Final   Staphylococcus epidermidis NOT DETECTED NOT DETECTED Final   Staphylococcus lugdunensis NOT DETECTED NOT DETECTED Final   Streptococcus species NOT DETECTED NOT DETECTED Final   Streptococcus agalactiae NOT DETECTED NOT DETECTED Final   Streptococcus pneumoniae NOT DETECTED NOT DETECTED Final   Streptococcus pyogenes NOT DETECTED NOT DETECTED Final   A.calcoaceticus-baumannii NOT DETECTED NOT DETECTED Final   Bacteroides fragilis NOT DETECTED NOT DETECTED Final   Enterobacterales NOT DETECTED NOT DETECTED Final   Enterobacter cloacae complex NOT DETECTED NOT DETECTED Final   Escherichia coli NOT DETECTED NOT DETECTED Final   Klebsiella aerogenes NOT DETECTED NOT DETECTED Final   Klebsiella oxytoca NOT DETECTED NOT DETECTED Final   Klebsiella pneumoniae NOT DETECTED NOT DETECTED Final   Proteus species NOT DETECTED NOT DETECTED Final   Salmonella species NOT DETECTED NOT DETECTED Final   Serratia marcescens  NOT DETECTED NOT DETECTED Final   Haemophilus influenzae NOT DETECTED NOT DETECTED Final   Neisseria meningitidis NOT DETECTED NOT DETECTED Final   Pseudomonas aeruginosa DETECTED (A) NOT DETECTED Final    Comment: CRITICAL RESULT CALLED TO, READ BACK BY AND VERIFIED WITH: JASON ROBINS @ 0202 06/18/2021 LFD    Stenotrophomonas maltophilia NOT DETECTED NOT DETECTED Final   Candida albicans NOT DETECTED NOT DETECTED Final   Candida auris NOT DETECTED NOT DETECTED Final   Candida glabrata NOT DETECTED NOT DETECTED Final   Candida krusei NOT DETECTED NOT DETECTED Final   Candida parapsilosis NOT DETECTED NOT DETECTED Final   Candida tropicalis NOT DETECTED NOT DETECTED Final   Cryptococcus neoformans/gattii NOT DETECTED NOT DETECTED Final   CTX-M ESBL NOT DETECTED NOT DETECTED Final   Carbapenem resistance IMP NOT DETECTED NOT DETECTED Final   Carbapenem resistance KPC NOT DETECTED NOT DETECTED Final   Carbapenem resistance NDM NOT DETECTED NOT DETECTED Final   Carbapenem resistance VIM NOT DETECTED NOT DETECTED Final    Comment: Performed at [facility], [address] [place], [place] [address]  Resp Panel by RT-PCR (Flu A&B, Covid) Nasopharyngeal Swab     Status: None   Collection Time: 06/17/21  1:00 PM   Specimen: Nasopharyngeal Swab; Nasopharyngeal(NP) swabs in vial transport medium  Result Value Ref Range Status   SARS Coronavirus 2 by RT PCR NEGATIVE NEGATIVE Final    Comment: (NOTE) SARS-CoV-2 target nucleic acids are NOT DETECTED.  The SARS-CoV-2 RNA is generally detectable in upper respiratory specimens during the acute phase of infection. The lowest concentration of SARS-CoV-2 viral copies this assay can detect is 138 copies/mL. A negative result does not preclude SARS-Cov-2 infection and should not be used as the sole basis for treatment or other patient management decisions. A negative result may occur with  improper specimen collection/handling, submission of  specimen other than nasopharyngeal swab, presence of viral mutation(s) within the areas targeted by this assay, and inadequate number of viral copies(<138 copies/mL). A negative result must be combined with clinical observations, patient history, and epidemiological information. The expected result is Negative.  Fact Sheet for Patients:  EntrepreneurPulse.com.au  Fact Sheet for Healthcare Providers:  IncredibleEmployment.be  This test is no t yet approved or cleared by the [place] FDA and  has been authorized for detection and/or diagnosis of SARS-CoV-2 by FDA under an Emergency Use Authorization (  EUA). This EUA will remain  in effect (meaning this test can be used) for the duration of the COVID-19 declaration under Section 564(b)(1) of the Act, 21 U.S.C.section 360bbb-3(b)(1), unless the authorization is terminated  or revoked sooner.       Influenza A by PCR NEGATIVE NEGATIVE Final   Influenza B by PCR NEGATIVE NEGATIVE Final    Comment: (NOTE) The Xpert Xpress SARS-CoV-2/FLU/RSV plus assay is intended as an aid in the diagnosis of influenza from Nasopharyngeal swab specimens and should not be used as a sole basis for treatment. Nasal washings and aspirates are unacceptable for Xpert Xpress SARS-CoV-2/FLU/RSV testing.  Fact Sheet for Patients: EntrepreneurPulse.com.au  Fact Sheet for Healthcare Providers: IncredibleEmployment.be  This test is not yet approved or cleared by the [place] FDA and has been authorized for detection and/or diagnosis of SARS-CoV-2 by FDA under an Emergency Use Authorization (EUA). This EUA will remain in effect (meaning this test can be used) for the duration of the COVID-19 declaration under Section 564(b)(1) of the Act, 21 U.S.C. section 360bbb-3(b)(1), unless the authorization is terminated or revoked.  Performed at [facility], [address] [place], [place] [address]   CULTURE, BLOOD (ROUTINE X 2) w Reflex to ID Panel     Status: None (Preliminary result)   Collection Time: 06/19/21  1:32 AM   Specimen: BLOOD  Result Value Ref Range Status   Specimen Description BLOOD LEFT HAND  Final   Special Requests   Final    BOTTLES DRAWN AEROBIC AND ANAEROBIC Blood Culture adequate volume   Culture   Final    NO GROWTH < 12 HOURS Performed at [facility], [address], [place], [place] [address]    Report Status PENDING  Incomplete  CULTURE, BLOOD (ROUTINE X 2) w Reflex to ID Panel     Status: None (Preliminary result)   Collection Time: 06/19/21  1:38 AM   Specimen: BLOOD  Result Value Ref Range Status   Specimen Description BLOOD RIGHT [facility]  Final   Special Requests   Final    BOTTLES DRAWN AEROBIC AND ANAEROBIC Blood Culture adequate volume   Culture   Final    NO GROWTH < 12 HOURS Performed at [facility], [address] [place], [place] [address]    Report Status PENDING  Incomplete         Radiology Studies: US Venous Img Upper Uni Right(DVT)  Result Date: 06/18/2021 CLINICAL DATA:  Thrombus in right internal jugular vein. Right internal jugular Port-A-Cath. EXAM: RIGHT UPPER EXTREMITY VENOUS DOPPLER ULTRASOUND TECHNIQUE: Gray-scale sonography with graded compression, as well as color Doppler and duplex ultrasound were performed to evaluate the upper extremity deep venous system from the level of the subclavian vein and including the jugular, axillary, basilic, radial, ulnar and upper cephalic vein. Spectral Doppler was utilized to evaluate flow at rest and with distal augmentation maneuvers. COMPARISON:  CT 06/17/2021 FINDINGS: Contralateral Subclavian Vein: No evidence of thrombus. Normal color Doppler flow and phasicity. Internal Jugular Vein: Positive for thrombus. Echogenic nonocclusive thrombus in the right internal jugular vein compatible with recent CT findings. Subclavian Vein: Positive for  thrombus. Evidence for nonocclusive thrombus in the proximal right subclavian region. Distal right subclavian vein has normal compressibility and color Doppler flow and augmentation. Axillary Vein: No evidence of thrombus. Normal compressibility and color Doppler flow. Cephalic Vein: Positive for thrombus. Small amount echogenic thrombus and incomplete compressibility of the distal cephalic vein in the right upper arm.  Basilic Vein: Negative for thrombus. Normal compressibility and color Doppler flow. Brachial Veins: Negative for thrombus. Normal compressibility and color Doppler flow. Radial Veins: Negative for thrombus. Normal compressibility and color Doppler flow. Ulnar Veins: Negative for thrombus. Normal compressibility and color Doppler flow. Other Findings:  None visualized. IMPRESSION: 1. Positive for deep venous thrombosis involving the right internal jugular vein. This thrombus is nonocclusive. In addition, there is nonocclusive thrombus in the proximal right subclavian vein probably near the right innominate vein junction. 2. Positive for superficial venous thrombosis in the right cephalic vein. Right cephalic vein thrombus is nonocclusive. Electronically Signed   By: Markus Daft M.D.   On: 06/18/2021 09:44   CT Maxillofacial W Contrast  Result Date: 06/17/2021 CLINICAL DATA:  Maxillofacial pain Swelling in the eyelids. History of metastatic lung cancer. EXAM: CT MAXILLOFACIAL WITH CONTRAST TECHNIQUE: Multidetector CT imaging of the maxillofacial structures was performed with intravenous contrast. Multiplanar CT image reconstructions were also generated. CONTRAST:  26mL OMNIPAQUE IOHEXOL 350 MG/ML SOLN COMPARISON:  Head MRI 05/04/2021.  PET-CT 05/03/2021. FINDINGS: Osseous: No acute fracture or destructive osseous process. Edentulous. Orbits: Moderate swelling/thickening of the preseptal soft tissues bilaterally, left greater than right. No fluid collection, postseptal inflammation, or intraorbital  mass. Grossly intact globes. Sinuses: Small volume fluid in the left sphenoid sinus. Underdeveloped right mastoid air cells which are partially chronically opacified with persistent fluid in the right middle ear cavity as well. Clear left mastoid air cells. Soft tissues: Soft tissue swelling involving the upper lip. Partially visualized subocclusive thrombus in the right internal jugular vein in the mid and lower neck above a Port-A-Cath. Partially visualized lymphadenopathy in the left lower neck and left supraclavicular region with the largest node covered on today's study measuring 1.1 cm in short axis, overall smaller than on the prior PET-CT. Limited intracranial: Mild vasogenic edema in the right temporoparietal region, less than on the prior MRI and with the known underlying mass not well demonstrated on this maxillofacial CT. IMPRESSION: 1. Bilateral periorbital and upper lip soft tissue swelling. No evidence of postseptal cellulitis or abscess. 2. Partially visualized subocclusive thrombus in the right internal jugular vein above a Port-A-Cath. 3. Partially visualized left lower neck and left supraclavicular lymphadenopathy, likely improved from the prior PET-CT. 4. Decreased right temporoparietal vasogenic edema. Electronically Signed   By: Logan Bores M.D.   On: 06/17/2021 13:04   DG Chest Portable 1 View  Result Date: 06/17/2021 CLINICAL DATA:  Sepsis, lung cancer, ongoing chemotherapy EXAM: PORTABLE CHEST 1 VIEW COMPARISON:  04/12/2021 FINDINGS: The heart size and mediastinal contours are within normal limits. Right chest port catheter. Irregular and nodular opacity projecting the perihilar right lung and right lung base, in keeping with known primary lung malignancy. No new or acute appearing airspace disease. The visualized skeletal structures are unremarkable. IMPRESSION: Irregular and nodular opacity projecting the perihilar right lung and right lung base, in keeping with known primary lung  malignancy. No new or acute appearing airspace disease. Electronically Signed   By: Eddie Candle M.D.   On: 06/17/2021 11:46        Scheduled Meds:  budesonide (PULMICORT) nebulizer solution  0.25 mg Nebulization BID   dexamethasone  4 mg Oral BID   dextromethorphan-guaiFENesin  1 tablet Oral BID   moxifloxacin  1 drop Both Eyes TID   potassium chloride  40 mEq Oral Daily   potassium chloride  40 mEq Oral Once   Tbo-filgastrim (GRANIX) SQ  300 mcg Subcutaneous Daily  umeclidinium-vilanterol  1 puff Inhalation Daily   Continuous Infusions:  ceFEPime (MAXIPIME) IV 2 g (06/18/21 2207)   lactated ringers with kcl 75 mL/hr at 06/18/21 1442   vancomycin 75 mL/hr at 06/18/21 1442    Assessment & Plan:   Principal Problem:   Neutropenic sepsis [facility]) Active Problems:   Primary cancer of right lower lobe of lung ([place])   Hyponatremia   Hypokalemia   Antineoplastic chemotherapy induced pancytopenia (HCC)   DVT (deep venous thrombosis) ([place])   Cellulitis of periorbital region of both eyes   Sepsis from cellulitis of periorbital region of both eyes (POA) with pseudomonas bacteremia: Periorbital cellulitis with bacterial conjunctivitis OU: ID and ophthalmology consulted input was appreciated Ophthalmology -cannot rule out reaction to chemotherapy  added Vigamox 1 GTT 3 times daily OU for 2 weeks Recommend warm compresses and lid scrubs twice daily for 4 days May follow-up at the [facility] as outpatient ID recommended continuing cefepime.  Discontinued vancomycin Follow-up sensitivities UA needs recollection with multiple species   Pseudomonas bacteremia ID following.  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 and a component of dehydration  Sodium level at 132 today improving with IV fluids  Continue gentle hydration  Monitor levels        Hypokalemia  related to GI losses from diarrhea  Will supplement      Stage IV /with mets small cell lung cancer Patient has completed radiation therapy but is still on chemotherapy CT of the orbit to this incidental improvement of brain mets Continue Decadron Oncology following       Subocclusive thrombus in the right IJ-incidental finding Unable to anticoagulate patient due to severe thrombocytopenia  Oncology following If trending up can consider anticoagulation then Venous US UE RT today revealed +dvt RIJ, Prox RT [place], both nonocclusive +SVT in Rt cephalic vein, nonocclusive    DVT prophylaxis: SCD Code Status: Full Family Communication: Husband at bedside Disposition Plan:  Status is: Inpatient  Remains inpatient appropriate because:Inpatient level of care appropriate due to severity of illness  Dispo: The patient is from: Home              Anticipated d/c is to: Home              Patient currently is not medically stable to d/c.   Difficult to place patient No            LOS: 2 days   Time spent: 45 minutes with more than 50% on [place], MD [facility] Pager [phone]  If 7PM-7AM, please contact night-coverage 06/19/2021, 8:48 AM

## 2021-06-19 NOTE — Progress Notes (Signed)
Cathy Glover   DOB:July 30, 1956   NK#:[id number]    Subjective: No acute issues overnight.  No nausea no vomiting.  No fevers or chills.  No headaches.  Objective:  Vitals:   06/19/21 1652 06/19/21 1949  BP: 109/75 (!) 122/54  Pulse: 100 94  Resp:  16  Temp:  97.8 F (36.6 C)  SpO2: 97% 96%     Intake/Output Summary (Last 24 hours) at 06/19/2021 2105 Last data filed at 06/19/2021 1823 Gross per 24 hour  Intake 1146.8 ml  Output --  Net 1146.8 ml    Physical Exam Vitals and nursing note reviewed.  Constitutional:      Comments: Patient resting in the bed; no acute distress.    HENT:     Head: Normocephalic and atraumatic.     Mouth/Throat:     Mouth: Mucous membranes are moist.     Pharynx: No oropharyngeal exudate.  Cardiovascular:     Rate and Rhythm: Normal rate and regular rhythm.  Pulmonary:     Effort: No respiratory distress.     Breath sounds: No wheezing.     Comments: Decreased breath sounds bilaterally at bases.  No wheeze or crackles Abdominal:     General: Bowel sounds are normal. There is no distension.     Palpations: Abdomen is soft. There is no mass.     Tenderness: There is no abdominal tenderness. There is no guarding or rebound.  Musculoskeletal:        General: No tenderness. Normal range of motion.     Cervical back: Normal range of motion and neck supple.  Skin:    General: Skin is warm.     Comments: She continues have erythematous swelling around the bilateral eyes.  However she is able to open her eyes better than on admission.  Neurological:     Mental Status: She is alert and oriented to person, place, and time.  Psychiatric:        Mood and Affect: Affect normal.        Judgment: Judgment normal.     Labs:  Lab Results  Component Value Date   WBC 2.0 (L) 06/19/2021   HGB 8.3 (L) 06/19/2021   HCT 23.5 (L) 06/19/2021   MCV 90.0 06/19/2021   PLT 72 (L) 06/19/2021   NEUTROABS 0.4 (LL) 06/17/2021    Lab Results  Component Value Date    NA 132 (L) 06/19/2021   K 3.2 (L) 06/19/2021   CL 102 06/19/2021   CO2 24 06/19/2021    Studies:  US Venous Img Upper Uni Right(DVT)  Result Date: 06/18/2021 CLINICAL DATA:  Thrombus in right internal jugular vein. Right internal jugular Port-A-Cath. EXAM: RIGHT UPPER EXTREMITY VENOUS DOPPLER ULTRASOUND TECHNIQUE: Gray-scale sonography with graded compression, as well as color Doppler and duplex ultrasound were performed to evaluate the upper extremity deep venous system from the level of the subclavian vein and including the jugular, axillary, basilic, radial, ulnar and upper cephalic vein. Spectral Doppler was utilized to evaluate flow at rest and with distal augmentation maneuvers. COMPARISON:  CT 06/17/2021 FINDINGS: Contralateral Subclavian Vein: No evidence of thrombus. Normal color Doppler flow and phasicity. Internal Jugular Vein: Positive for thrombus. Echogenic nonocclusive thrombus in the right internal jugular vein compatible with recent CT findings. Subclavian Vein: Positive for thrombus. Evidence for nonocclusive thrombus in the proximal right subclavian region. Distal right subclavian vein has normal compressibility and color Doppler flow and augmentation. Axillary Vein: No evidence of thrombus. Normal compressibility and color  Doppler flow. Cephalic Vein: Positive for thrombus. Small amount echogenic thrombus and incomplete compressibility of the distal cephalic vein in the right upper arm. Basilic Vein: Negative for thrombus. Normal compressibility and color Doppler flow. Brachial Veins: Negative for thrombus. Normal compressibility and color Doppler flow. Radial Veins: Negative for thrombus. Normal compressibility and color Doppler flow. Ulnar Veins: Negative for thrombus. Normal compressibility and color Doppler flow. Other Findings:  None visualized. IMPRESSION: 1. Positive for deep venous thrombosis involving the right internal jugular vein. This thrombus is nonocclusive. In addition,  there is nonocclusive thrombus in the proximal right subclavian vein probably near the right innominate vein junction. 2. Positive for superficial venous thrombosis in the right cephalic vein. Right cephalic vein thrombus is nonocclusive. Electronically Signed   By: Markus Daft M.D.   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   #Metastatic small cell lung cancer- with metastasis to the right hilum/supraclavicular/subpectoral/axillary adenopathy; adrenal metastases and pelvic adenopathy.  June 2022 brain MRI-metastasis. S/p  carboplatin etoposide approximately 10 days ago.  Given the neutropenia-recommend Granix on a daily basis.  #Sepsis/bacteremia-Pseudomonas-source of infection is unclear.  CT of the orbits-negative for any obvious cellulitis-on broad-spectrum antibiotics.  Followed by ID.  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 ].  Dexamethasone 1 mg twice a day; CT of the orbits-incidental improvement of the brain metastasis.  Stable  # hyponatremia- 129; dehydration/SIADH from underlying malignancy-continue IV fluids.  Stable  #Incidental nonocclusive thrombus is noted at the right IJ-upper extremity venous Dopplers.  However hold anticoagulation given thrombocytopenia.  However trending up-consider anticoagulation.  Today platelets 72.  If continued uptrend in platelets- we will plan anticoagulation tomorrow.  The above plan of care was discussed with the patient's husband at the bedside.  Cammie Sickle, MD 06/19/2021  9:05 PM

## 2021-06-19 NOTE — Progress Notes (Signed)
Date of Admission:  06/17/2021   ID: Cathy Glover is a 65 y.o. female  Principal Problem:   Neutropenic sepsis ([place]) Active Problems:   Primary cancer of right lower lobe of lung (HCC)   Hyponatremia   Hypokalemia   Antineoplastic chemotherapy induced pancytopenia (HCC)   DVT (deep venous thrombosis) (HCC)   Cellulitis of periorbital region of both eyes    Subjective: Doing better Able to open her eyelids No fever  Medications:   budesonide (PULMICORT) nebulizer solution  0.25 mg Nebulization BID   dexamethasone  4 mg Oral BID   dextromethorphan-guaiFENesin  1 tablet Oral BID   moxifloxacin  1 drop Both Eyes TID   potassium chloride  40 mEq Oral Daily   potassium chloride  40 mEq Oral Once   Tbo-filgastrim (GRANIX) SQ  300 mcg Subcutaneous Daily   umeclidinium-vilanterol  1 puff Inhalation Daily    Objective: Vital signs in last 24 hours: Temp:  [96.8 F (36 C)-98.8 F (37.1 C)] 97.8 F (36.6 C) (08/09 1949) Pulse Rate:  [94-102] 94 (08/09 1949) Resp:  [16-19] 16 (08/09 1949) BP: (94-123)/(54-81) 122/54 (08/09 1949) SpO2:  [95 %-100 %] 96 % (08/09 1949)  PHYSICAL EXAM:  General: Alert, cooperative, no distress, appears stated age.  Head: Normocephalic, without obvious abnormality, atraumatic. Eyes: Conjunctive  injected Lower lids has scabbing Inner canthus bilaterally has eschar  ENT Nares normal. No drainage or sinus tenderness. About the upper lip there is a eschar. Neck: Supple, symmetrical, no adenopathy, thyroid: non tender no carotid bruit and no JVD. Back: No CVA tenderness. Lungs: Clear to auscultation bilaterally. No Wheezing or Rhonchi. No rales. Heart: Regular rate and rhythm, no murmur, rub or gallop. Abdomen: Soft, non-tender,not distended. Bowel sounds normal. No masses Extremities: atraumatic, no cyanosis. No edema. No clubbing Skin: No rashes or lesions. Or bruising Lymph: Cervical, supraclavicular normal. Neurologic: Grossly  non-focal  Lab Results Recent Labs    06/18/21 0717 06/19/21 0133  WBC 1.1* 2.0*  HGB 9.6* 8.3*  HCT 27.0* 23.5*  NA 131* 132*  K 3.2* 3.2*  CL 94* 102  CO2 23 24  BUN 12 19  CREATININE 0.52 0.62   Liver Panel Recent Labs    06/17/21 1104  PROT 7.4  ALBUMIN 2.4*  AST 32  ALT 63*  ALKPHOS 134*  BILITOT 0.9   Sedimentation Rate No results for input(s): ESRSEDRATE in the last 72 hours. C-Reactive Protein No results for input(s): CRP in the last 72 hours.  Microbiology:  Studies/Results: US Venous Img Upper Uni Right(DVT)  Result Date: 06/18/2021 CLINICAL DATA:  Thrombus in right internal jugular vein. Right internal jugular Port-A-Cath. EXAM: RIGHT UPPER EXTREMITY VENOUS DOPPLER ULTRASOUND TECHNIQUE: Gray-scale sonography with graded compression, as well as color Doppler and duplex ultrasound were performed to evaluate the upper extremity deep venous system from the level of the subclavian vein and including the jugular, axillary, basilic, radial, ulnar and upper cephalic vein. Spectral Doppler was utilized to evaluate flow at rest and with distal augmentation maneuvers. COMPARISON:  CT 06/17/2021 FINDINGS: Contralateral Subclavian Vein: No evidence of thrombus. Normal color Doppler flow and phasicity. Internal Jugular Vein: Positive for thrombus. Echogenic nonocclusive thrombus in the right internal jugular vein compatible with recent CT findings. Subclavian Vein: Positive for thrombus. Evidence for nonocclusive thrombus in the proximal right subclavian region. Distal right subclavian vein has normal compressibility and color Doppler flow and augmentation. Axillary Vein: No evidence of thrombus. Normal compressibility and color Doppler flow. Cephalic Vein: Positive  for thrombus. Small amount echogenic thrombus and incomplete compressibility of the distal cephalic vein in the right upper arm. Basilic Vein: Negative for thrombus. Normal compressibility and color Doppler flow.  Brachial Veins: Negative for thrombus. Normal compressibility and color Doppler flow. Radial Veins: Negative for thrombus. Normal compressibility and color Doppler flow. Ulnar Veins: Negative for thrombus. Normal compressibility and color Doppler flow. Other Findings:  None visualized. IMPRESSION: 1. Positive for deep venous thrombosis involving the right internal jugular vein. This thrombus is nonocclusive. In addition, there is nonocclusive thrombus in the proximal right subclavian vein probably near the right innominate vein junction. 2. Positive for superficial venous thrombosis in the right cephalic vein. Right cephalic vein thrombus is nonocclusive. Electronically Signed   By: Markus Daft M.D.   On: 06/18/2021 09:44     Assessment/Plan: Pseudomonas bacteremia in a patient with stage IV metastatic lung cancer.  Neutropenia with thrombocytopenia secondary to chemotherapy.  Bilateral conjunctivitis/blepharitis scab on the inner eyes see preseptal cellulitis.  Continue cefepime.

## 2021-06-20 DIAGNOSIS — A419 Sepsis, unspecified organism: Secondary | ICD-10-CM | POA: Diagnosis not present

## 2021-06-20 DIAGNOSIS — D709 Neutropenia, unspecified: Secondary | ICD-10-CM | POA: Diagnosis not present

## 2021-06-20 LAB — BASIC METABOLIC PANEL
Anion gap: 11 (ref 5–15)
Anion gap: 8 (ref 5–15)
BUN: 18 mg/dL (ref 8–23)
BUN: 18 mg/dL (ref 8–23)
CO2: 23 mmol/L (ref 22–32)
CO2: 21 mmol/L — ABNORMAL LOW (ref 22–32)
Calcium: 8.4 mg/dL — ABNORMAL LOW (ref 8.9–10.3)
Calcium: 8.6 mg/dL — ABNORMAL LOW (ref 8.9–10.3)
Chloride: 102 mmol/L (ref 98–111)
Chloride: 107 mmol/L (ref 98–111)
Creatinine, Ser: 0.65 mg/dL (ref 0.44–1.00)
Creatinine, Ser: 0.55 mg/dL (ref 0.44–1.00)
GFR, Estimated: >60 mL/min (ref >60)
GFR, Estimated: >60 mL/min (ref >60)
Glucose, Bld: 191 mg/dL — ABNORMAL HIGH (ref 70–99)
Glucose, Bld: 181 mg/dL — ABNORMAL HIGH (ref 70–99)
Potassium: 6.1 mmol/L — ABNORMAL HIGH (ref 3.5–5.1)
Potassium: 5.3 mmol/L — ABNORMAL HIGH (ref 3.5–5.1)
Sodium: 136 mmol/L (ref 135–145)
Sodium: 136 mmol/L (ref 135–145)

## 2021-06-20 LAB — CBC WITH DIFFERENTIAL/PLATELET
Abs Immature Granulocytes: 1.14 10*3/uL — ABNORMAL HIGH (ref 0.00–0.07)
Basophils Absolute: 0.1 10*3/uL (ref 0.0–0.1)
Basophils Relative: 1 %
Eosinophils Absolute: 0 10*3/uL (ref 0.0–0.5)
Eosinophils Relative: 0 %
HCT: 24.9 % — ABNORMAL LOW (ref 36.0–46.0)
Hemoglobin: 8.4 g/dL — ABNORMAL LOW (ref 12.0–15.0)
Immature Granulocytes: 16 %
Lymphocytes Relative: 9 %
Lymphs Abs: 0.7 10*3/uL (ref 0.7–4.0)
MCH: 31.5 pg (ref 26.0–34.0)
MCHC: 33.7 g/dL (ref 30.0–36.0)
MCV: 93.3 fL (ref 80.0–100.0)
Monocytes Absolute: 0.4 10*3/uL (ref 0.1–1.0)
Monocytes Relative: 6 %
Neutro Abs: 4.9 10*3/uL (ref 1.7–7.7)
Neutrophils Relative %: 68 %
Platelets: 90 10*3/uL — ABNORMAL LOW (ref 150–400)
RBC: 2.67 MIL/uL — ABNORMAL LOW (ref 3.87–5.11)
RDW: 15.2 % (ref 11.5–15.5)
Smear Review: NORMAL
WBC: 7.2 10*3/uL (ref 4.0–10.5)
nRBC: 0.6 % — ABNORMAL HIGH (ref 0.0–0.2)

## 2021-06-20 LAB — CULTURE, BLOOD (ROUTINE X 2): Special Requests: ADEQUATE

## 2021-06-20 LAB — PROCALCITONIN: Procalcitonin: 0.73 ng/mL

## 2021-06-20 MED ORDER — APIXABAN 5 MG PO TABS
5.0000 mg | ORAL_TABLET | Freq: Two times a day (BID) | ORAL | Status: DC
  Administered 2021-06-20 – 2021-06-21 (×2): 5 mg via ORAL
  Filled 2021-06-20 (×2): qty 1

## 2021-06-20 MED ORDER — SODIUM CHLORIDE 0.9 % IV SOLN: INTRAVENOUS | Status: DC

## 2021-06-20 MED ORDER — DEXAMETHASONE 0.5 MG PO TABS
1.0000 mg | ORAL_TABLET | Freq: Two times a day (BID) | ORAL | Status: DC
  Administered 2021-06-21: 1 mg via ORAL
  Filled 2021-06-20 (×2): qty 2

## 2021-06-20 NOTE — Progress Notes (Signed)
Date of Admission:  06/17/2021     ID: Cathy Glover is a 65 y.o. female  Principal Problem:   Neutropenic sepsis ([place]) Active Problems:   Primary cancer of right lower lobe of lung (HCC)   Hyponatremia   Hypokalemia   Antineoplastic chemotherapy induced pancytopenia (HCC)   DVT (deep venous thrombosis) (HCC)   Cellulitis of periorbital region of both eyes    Subjective: Patient is feeling better. Able to open eyes better Pain is much improved No fever   Medications:   budesonide (PULMICORT) nebulizer solution  0.25 mg Nebulization BID   dexamethasone  4 mg Oral BID   dextromethorphan-guaiFENesin  1 tablet Oral BID   moxifloxacin  1 drop Both Eyes TID   Tbo-filgastrim (GRANIX) SQ  300 mcg Subcutaneous Daily   umeclidinium-vilanterol  1 puff Inhalation Daily    Objective: Vital signs in last 24 hours: Temp:  [97.2 F (36.2 C)-97.8 F (36.6 C)] 97.7 F (36.5 C) (08/10 1132) Pulse Rate:  [82-102] 88 (08/10 1132) Resp:  [16-19] 18 (08/10 1132) BP: (94-135)/(54-90) 135/72 (08/10 1132) SpO2:  [95 %-99 %] 98 % (08/10 1132)  PHYSICAL EXAM:  General: Alert, cooperative, no distress,  Head: Normocephalic, without obvious abnormality, atraumatic. Eyes: Ulceration of the lower lids right more than left. Eschar/erythema gangrenosum on the inner canthus Erythema surrounding the eyes is minimal Eschar versus ecthyma gangrenosum over the upper lip     ENT Nares normal. No drainage or sinus tenderness. Lips, mucosa, and tongue normal. No Thrush Neck: Supple, symmetrical, no adenopathy, thyroid: non tender no carotid bruit and no JVD. Back: No CVA tenderness. Lungs: Clear to auscultation bilaterally. No Wheezing or Rhonchi. No rales. Heart: Regular rate and rhythm, no murmur, rub or gallop. Port in place Abdomen: Soft, non-tender,not distended. Bowel sounds normal. No masses Extremities: atraumatic, no cyanosis. No edema. No clubbing Skin: No rashes or lesions. Or  bruising Lymph: Cervical, supraclavicular normal. Neurologic: Grossly non-focal  Lab Results Recent Labs    06/19/21 0133 06/20/21 0825 06/20/21 1124  WBC 2.0* 7.2  --   HGB 8.3* 8.4*  --   HCT 23.5* 24.9*  --   NA 132* 136 136  K 3.2* 6.1* 5.3*  CL 102 102 107  CO2 24 23 21*  BUN '19 18 18  ' CREATININE 0.62 0.65 0.55   Liver Panel No results for input(s): PROT, ALBUMIN, AST, ALT, ALKPHOS, BILITOT, BILIDIR, IBILI in the last 72 hours. Sedimentation Rate No results for input(s): ESRSEDRATE in the last 72 hours. C-Reactive Protein No results for input(s): CRP in the last 72 hours.  Microbiology: 06/17/2021 blood culture positive for Pseudomonas 4 out of 4 bottles 06/19/2021 blood culture no growth so far    Assessment/Plan: Pseudomonas bacteremia with bilateral rating blepharitis and preseptal cellulitis.  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 Eschar versus ecthyma gangrenosum wound on the upper lip near the philtrum. We will send a culture from that. Patient is improving on cefepime.  The Pseudomonas is highly susceptible and she has an option to go on p.o. Cipro.    She is however on dexamethasone 4 mg twice daily and that increases her risk for tendon rupture.  So we will have to discuss with the patient about that.  The other option would be to continue with IV cefepime at home through the port.  Metastatic lung cancer stage IV with brain mets.  Status post radiation of the brain.. Started chemo  and has received the first cycle.  On etoposide, carboplatin and PD-L1 ligand inhibitor  Neutropenia and thrombocytopenia secondary to chemotherapy is improving.  Discussed the management with the patient and her son at bedside.

## 2021-06-20 NOTE — Progress Notes (Signed)
Kennis Buell   DOB:[birth year]   OE#:[id number]    Subjective: Patient frustrated with the blood draws/lack of IV access.  However denies any nausea vomiting.  Patient has been walking the bathroom.  Also spending most of the time in bed.  Objective:  Vitals:   06/20/21 1843 06/20/21 1945  BP: 118/71 130/70  Pulse: 92 94  Resp: 16 16  Temp: 97.9 F (36.6 C) 97.6 F (36.4 C)  SpO2: (!) 88% 95%     Intake/Output Summary (Last 24 hours) at 06/20/2021 2153 Last data filed at 06/20/2021 1807 Gross per 24 hour  Intake 1843.67 ml  Output --  Net 1843.67 ml    Physical Exam Vitals and nursing note reviewed.  Constitutional:      Comments: Patient resting in the bed; no acute distress.    HENT:     Head: Normocephalic and atraumatic.     Mouth/Throat:     Mouth: Mucous membranes are moist.     Pharynx: No oropharyngeal exudate.  Cardiovascular:     Rate and Rhythm: Normal rate and regular rhythm.  Pulmonary:     Effort: No respiratory distress.     Breath sounds: No wheezing.     Comments: Decreased breath sounds bilaterally at bases.  No wheeze or crackles Abdominal:     General: Bowel sounds are normal. There is no distension.     Palpations: Abdomen is soft. There is no mass.     Tenderness: There is no abdominal tenderness. There is no guarding or rebound.  Musculoskeletal:        General: No tenderness. Normal range of motion.     Cervical back: Normal range of motion and neck supple.  Skin:    General: Skin is warm.     Comments: She continues have erythematous swelling around the bilateral eyes.  However she is able to open her eyes better than on admission.  Neurological:     Mental Status: She is alert and oriented to person, place, and time.  Psychiatric:        Mood and Affect: Affect normal.        Judgment: Judgment normal.     Labs:  Lab Results  Component Value Date   WBC 7.2 06/20/2021   HGB 8.4 (L) 06/20/2021   HCT 24.9 (L) 06/20/2021   MCV 93.3  06/20/2021   PLT 90 (L) 06/20/2021   NEUTROABS 4.9 06/20/2021    Lab Results  Component Value Date   NA 136 06/20/2021   K 5.3 (H) 06/20/2021   CL 107 06/20/2021   CO2 21 (L) 06/20/2021    Studies:  No results found.  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  #Metastatic small cell lung cancer- with metastasis to the right hilum/supraclavicular/subpectoral/axillary adenopathy; adrenal metastases and pelvic adenopathy/ brain MRI-metastasis. S/p  carboplatin etoposide approximately 12 days ago.  Given the neutropenia-recommend Granix on a daily basis.  # Sepsis/bacteremia-Pseudomonas-source of infection is unclear.  CT of the orbits-negative for any obvious cellulitis-on broad-spectrum antibiotics.  Followed by ID.  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 ].  Will decrease the dose of dexamethasone 1 mg twice a day; CT of the orbits-incidental improvement of the brain metastasis.  Stable  #Incidental nonocclusive thrombus is noted at the right IJ-upper extremity venous Dopplers.  Platelets 90,000 trending up-start  Eliquis 5 mg twice daily.  The above plan of care was discussed with the patient's husband over the phone.  Also will plan to hold off chemotherapy which was planned for next week.   Cammie Sickle, MD 06/20/2021  9:53 PM

## 2021-06-20 NOTE — Progress Notes (Signed)
PROGRESS NOTE  Cathy Glover  DOB: 1955-12-06  PCP: Kathyrn Lass ENI:[id number]  DOA: 06/17/2021  LOS: 3 days  Hospital Day: 4   Chief Complaint  Patient presents with   Code Sepsis    Brief narrative: Cathy Glover is a 65 y.o. 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 Patient denies any trauma to her eyelids and denies having any pain in her eyes.  In the ED, she was noted to have significant bilateral leg swelling left more than right with mucus secretion causing the eyes to mat.  Labs showed severe neutropenia, hemoglobin low at 9, platelets low in the 60s CT of the orbits showed moderate swelling/thickening of the preseptal soft tissue bilaterally left more than right with no fluid collection or postoperative inflammation it also showed chronically opacified fluid in the right middle ear cavity, and incidental subocclusive thrombus of the right internal jugular vein. Admitted to hospitalist service. Blood cultures sent on admission showed significant growth of Pseudomonas aeruginosa Oncology, ID and ophthalmology consultation were obtained See below for details   Subjective: Patient was seen and examined this morning. Elderly Caucasian female.  Lying in bed.  Not in distress.  Feels better than at presentation.  Looks tired.  Assessment/Plan: Sepsis - POA Bilateral periorbital cellulitis with bacterial conjunctivitis -ID, ophthalmology consult appreciated -On Vigamox 1 GTT 3 times daily OU for 2 weeks -Recommend warm compresses and lid scrubs twice daily for 4 days -May follow-up at the [facility] as outpatient    Pseudomonas bacteremia -Currently on IV cefepime per  infectious disease recommendation.   Pancytopenia secondary to antineoplastic chemotherapy -On Granix daily per oncology.  All cell lines improving. -Duration of Granix per oncology. Recent Labs  Lab 06/17/21 1104 06/18/21 0717 06/19/21 0133 06/20/21 0825  WBC 0.8* 1.1* 2.0* 7.2  NEUTROABS 0.4*  --   --  4.9  HGB 9.7* 9.6* 8.3* 8.4*  HCT 27.1* 27.0* 23.5* 24.9*  MCV 88.9 90.6 90.0 93.3  PLT 81* 68* 72* 90*   Hyponatremia -Sodium level was low at 126 at presentation.  Gradually improved to normal with hydration.    Recent Labs  Lab 06/17/21 1104 06/18/21 0717 06/19/21 0133 06/20/21 0825 06/20/21 1124  NA 126* 131* 132* 136 136   Hypokalemia/hyperkalemia -Potassium level was low at 2.7.  Replaced with oral and IV supplement. -Blood work today showed potassium level up to 6.1.  Repeat BMP showed a low level at 5.3. -Potassium supplementation stopped.  Continue to monitor. 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   Subocclusive thrombus in the right IJ -incidental finding on CT of the orbit. -Ultrasound of right IJ was positive for DVT of the right IJ vein with nonocclusive thrombus.  It also showed a nonocclusive thrombus in the proximal right subclavian vein, superficial nonocclusive venous thrombosis in the right cephalic vein. -Not on anticoagulation because of low platelet count.  Since platelet count  is improving now, she probably is a candidate for anticoagulation.  Will discuss with oncology.   Mobility: Encourage ambulation.  Pending PT eval Code Status:   Code Status: Full Code  Nutritional status: Body mass index is 22.04 kg/m.     Diet:  Diet Order             Diet  2 gram sodium Room service appropriate? Yes; Fluid consistency: Thin  Diet effective now                  DVT prophylaxis:  SCDs Start: 06/17/21 1403 apixaban (ELIQUIS) tablet 5 mg   Antimicrobials: IV cefepime Fluid: Normal saline at 75 mill per hour Consultants: Oncology, ID Family Communication: None at bedside  Status is: Inpatient  Remains inpatient appropriate because: On IV antibiotics for bacteremia  Dispo: The patient is from: Home              Anticipated d/c is to: Hopefully home, pending PT eval              Patient currently is not medically stable to d/c.   Difficult to place patient No     Infusions:   sodium chloride 75 mL/hr at 06/20/21 1507   ceFEPime (MAXIPIME) IV 20 mL/hr at 06/20/21 1000    Scheduled Meds:  apixaban  5 mg Oral BID   budesonide (PULMICORT) nebulizer solution  0.25 mg Nebulization BID   dexamethasone  4 mg Oral BID   dextromethorphan-guaiFENesin  1 tablet Oral BID   moxifloxacin  1 drop Both Eyes TID   Tbo-filgastrim (GRANIX) SQ  300 mcg Subcutaneous Daily   umeclidinium-vilanterol  1 puff Inhalation Daily    Antimicrobials: Anti-infectives (From admission, onward)    Start     Dose/Rate Route Frequency Ordered Stop   06/18/21 1300  vancomycin (VANCOREADY) IVPB 750 mg/150 mL  Status:  Discontinued        750 mg 150 mL/hr over 60 Minutes Intravenous Every 24 hours 06/17/21 1428 06/19/21 0856   06/18/21 1200  ceFEPIme (MAXIPIME) 2 g in sodium chloride 0.9 % 100 mL IVPB       See Hyperspace for full Linked Orders Report.   2 g 200 mL/hr over 30 Minutes Intravenous Every 12 hours 06/17/21 1422     06/17/21 1430  ceFEPIme (MAXIPIME) 1 g in sodium chloride 0.9 % 100 mL IVPB       See Hyperspace for full Linked Orders Report.   1 g 200 mL/hr over 30 Minutes Intravenous  Once 06/17/21 1422 06/17/21 1555   06/17/21 1415  ceFEPIme (MAXIPIME) 2 g in sodium chloride 0.9 % 100 mL IVPB  Status:  Discontinued        2 g 200 mL/hr over  30 Minutes Intravenous Every 8 hours 06/17/21 1407 06/17/21 1422   06/17/21 1200  acyclovir (ZOVIRAX) 470 mg in dextrose 5 % 250 mL IVPB        10 mg/kg  47.1 kg 259.4 mL/hr over 60 Minutes Intravenous  Once 06/17/21 1104 06/17/21 1509   06/17/21 1115  vancomycin (VANCOCIN) IVPB 1000 mg/200 mL premix        1,000 mg 200 mL/hr over 60 Minutes Intravenous  Once 06/17/21 1101 06/17/21 1355   06/17/21 1115  ceFEPIme (MAXIPIME) 1 g in sodium chloride 0.9 % 100 mL IVPB        1 g 200 mL/hr over 30 Minutes Intravenous  Once 06/17/21 1101 06/17/21 1242  PRN meds: acetaminophen **OR** acetaminophen, iohexol, ondansetron **OR** ondansetron (ZOFRAN) IV   Objective: Vitals:   06/20/21 1132 06/20/21 1722  BP: 135/72 133/81  Pulse: 88 94  Resp: 18 18  Temp: 97.7 F (36.5 C) 97.6 F (36.4 C)  SpO2: 98% 96%    Intake/Output Summary (Last 24 hours) at 06/20/2021 1731 Last data filed at 06/20/2021 1507 Gross per 24 hour  Intake 1780.97 ml  Output --  Net 1780.97 ml   Filed Weights   06/17/21 1105 06/18/21 1846  Weight: 47 kg 49.5 kg   Weight change:  Body mass index is 22.04 kg/m.   Physical Exam: General exam: Pleasant, elderly Caucasian female.  Not in distress.  Looks older for age. Skin: No rashes, lesions or ulcers. HEENT: Atraumatic, normocephalic, no obvious bleeding.  Dried scab on the inner canthus of the left eye and on upper lip. Lungs: Clear discussed bilaterally CVS: Regular rate and rhythm, no murmur GI/Abd soft, nontender, nondistended, bowel sound present CNS: Alert, awake, oriented x3 Psychiatry: Depressed look Extremities: No pedal edema, no calf tenderness  Data Review: I have personally reviewed the laboratory data and studies available.  Recent Labs  Lab 06/17/21 1104 06/18/21 0717 06/19/21 0133 06/20/21 0825  WBC 0.8* 1.1* 2.0* 7.2  NEUTROABS 0.4*  --   --  4.9  HGB 9.7* 9.6* 8.3* 8.4*  HCT 27.1* 27.0* 23.5* 24.9*  MCV 88.9 90.6 90.0 93.3   PLT 81* 68* 72* 90*   Recent Labs  Lab 06/17/21 1104 06/18/21 0717 06/19/21 0133 06/20/21 0825 06/20/21 1124  NA 126* 131* 132* 136 136  K 2.7* 3.2* 3.2* 6.1* 5.3*  CL 88* 94* 102 102 107  CO2 25 23 24 23  21*  GLUCOSE 212* 152* 172* 191* 181*  BUN 12 12 19 18 18   CREATININE 0.61 0.52 0.62 0.65 0.55  CALCIUM 8.5* 8.4* 8.2* 8.4* 8.6*    F/u labs ordered Unresulted Labs (From admission, onward)     Start     Ordered   06/21/21 6803  Basic metabolic panel  Daily,   R      06/20/21 0756   06/21/21 0500  CBC with Differential/Platelet  Daily,   R      06/20/21 0756   06/21/21 0500  Procalcitonin  Daily,   R      06/20/21 0756   06/20/21 1459  Aerobic Culture w Gram Stain (superficial specimen)  Once,   R        06/20/21 1458            Signed, Terrilee Croak, MD [facility] 06/20/2021

## 2021-06-21 ENCOUNTER — Inpatient Hospital Stay: Payer: 59

## 2021-06-21 DIAGNOSIS — D709 Neutropenia, unspecified: Secondary | ICD-10-CM | POA: Diagnosis not present

## 2021-06-21 DIAGNOSIS — A419 Sepsis, unspecified organism: Secondary | ICD-10-CM | POA: Diagnosis not present

## 2021-06-21 LAB — CBC WITH DIFFERENTIAL/PLATELET
Abs Immature Granulocytes: 1.07 10*3/uL — ABNORMAL HIGH (ref 0.00–0.07)
Basophils Absolute: 0 10*3/uL (ref 0.0–0.1)
Basophils Relative: 0 %
Eosinophils Absolute: 0 10*3/uL (ref 0.0–0.5)
Eosinophils Relative: 0 %
HCT: 24.4 % — ABNORMAL LOW (ref 36.0–46.0)
Hemoglobin: 8 g/dL — ABNORMAL LOW (ref 12.0–15.0)
Immature Granulocytes: 9 %
Lymphocytes Relative: 6 %
Lymphs Abs: 0.8 10*3/uL (ref 0.7–4.0)
MCH: 31.4 pg (ref 26.0–34.0)
MCHC: 32.8 g/dL (ref 30.0–36.0)
MCV: 95.7 fL (ref 80.0–100.0)
Monocytes Absolute: 0.8 10*3/uL (ref 0.1–1.0)
Monocytes Relative: 7 %
Neutro Abs: 9.7 10*3/uL — ABNORMAL HIGH (ref 1.7–7.7)
Neutrophils Relative %: 78 %
Platelets: 95 10*3/uL — ABNORMAL LOW (ref 150–400)
RBC: 2.55 MIL/uL — ABNORMAL LOW (ref 3.87–5.11)
RDW: 15.7 % — ABNORMAL HIGH (ref 11.5–15.5)
Smear Review: NORMAL
WBC: 12.5 10*3/uL — ABNORMAL HIGH (ref 4.0–10.5)
nRBC: 0.6 % — ABNORMAL HIGH (ref 0.0–0.2)

## 2021-06-21 LAB — BASIC METABOLIC PANEL
Anion gap: 8 (ref 5–15)
BUN: 16 mg/dL (ref 8–23)
CO2: 24 mmol/L (ref 22–32)
Calcium: 8 mg/dL — ABNORMAL LOW (ref 8.9–10.3)
Chloride: 103 mmol/L (ref 98–111)
Creatinine, Ser: 0.64 mg/dL (ref 0.44–1.00)
GFR, Estimated: >60 mL/min (ref >60)
Glucose, Bld: 188 mg/dL — ABNORMAL HIGH (ref 70–99)
Potassium: 4.3 mmol/L (ref 3.5–5.1)
Sodium: 135 mmol/L (ref 135–145)

## 2021-06-21 LAB — PROCALCITONIN: Procalcitonin: 0.71 ng/mL

## 2021-06-21 MED ORDER — APIXABAN 5 MG PO TABS
5.0000 mg | ORAL_TABLET | Freq: Two times a day (BID) | ORAL | 0 refills | Status: DC
Start: 2021-06-21 — End: 2021-07-17

## 2021-06-21 MED ORDER — CIPROFLOXACIN HCL 750 MG PO TABS: 750.0000 mg | ORAL_TABLET | Freq: Two times a day (BID) | ORAL | 0 refills | Status: AC

## 2021-06-21 MED ORDER — MOXIFLOXACIN HCL 0.5 % OP SOLN: 1.0000 [drp] | Freq: Three times a day (TID) | OPHTHALMIC | 0 refills | Status: AC

## 2021-06-21 MED ORDER — DEXAMETHASONE 1 MG PO TABS
1.0000 mg | ORAL_TABLET | Freq: Two times a day (BID) | ORAL | 0 refills | Status: AC
Start: 2021-06-21 — End: 2021-06-28

## 2021-06-21 MED ORDER — IOHEXOL 300 MG/ML  SOLN
125.0000 mL | Freq: Once | INTRAMUSCULAR | Status: AC | PRN
  Administered 2021-06-21: 10:00:00 10 mL

## 2021-06-21 MED ORDER — HEPARIN SOD (PORK) LOCK FLUSH 100 UNIT/ML IV SOLN
500.0000 [IU] | Freq: Once | INTRAVENOUS | Status: AC
  Administered 2021-06-21: 500 [IU] via INTRAVENOUS
  Filled 2021-06-21: qty 5

## 2021-06-21 MED ORDER — HEPARIN SOD (PORK) LOCK FLUSH 1 UNIT/ML IV SOLN: 0.5000 mL | INTRAVENOUS | Status: DC | PRN

## 2021-06-21 MED ORDER — SACCHAROMYCES BOULARDII 250 MG PO CAPS: 250.0000 mg | ORAL_CAPSULE | Freq: Two times a day (BID) | ORAL | 0 refills | Status: AC

## 2021-06-21 NOTE — TOC Transition Note (Signed)
Transition of Care [facility]) - CM/SW Discharge Note   Patient Details  Name: Cathy Glover MRN: [id number] Date of Birth: [birth year]  [facility] CM/SW Contact:  Shelbie Hutching, RN Phone Number: 06/21/2021, 4:26 PM   Clinical Narrative:    Patient medically cleared for discharge home with husband today.  Husband is here at the hospital and will transport patient home.  Declined home health services at this time, per husband it sounds like patient is close to her baseline.  Walker ordered from [employer] and will be delivered to the room before patient leaves.   Hospital bed orders are in and [facility] has the referral.  Husband is not sure about the bed at this time and will follow up with [facility].    Final next level of care: Home/Self Care Barriers to Discharge: Barriers Resolved   Patient Goals and CMS Choice Patient states their goals for this hospitalization and ongoing recovery are:: to go home CMS Medicare.gov Compare Post Acute Care list provided to:: Patient Choice offered to / list presented to : Patient  Discharge Placement                       Discharge Plan and Services                                     Social Determinants of Health (SDOH) Interventions     Readmission Risk Interventions Readmission Risk Prevention Plan 06/19/2021  Transportation Screening Complete  PCP or Specialist Appt within 3-5 Days Complete  HRI or [place] Complete  Social Work Consult for [place] Planning/Counseling Complete  Palliative Care Screening Not Applicable  Medication Review [employer] photographer) Complete

## 2021-06-21 NOTE — Progress Notes (Signed)
Patient discharging home. Vital signs stable at time of discharge as reflected in discharge summary. Discharge instructions given and verbal understanding returned. Patient to call physicians to make follow up appointments. Patient discharging with walker. No questions at this time.

## 2021-06-21 NOTE — Progress Notes (Signed)
Patient arrived on unit with port accessed. Dc'd per MD before discharge.

## 2021-06-21 NOTE — Progress Notes (Addendum)
   Date of Admission:  06/17/2021     ID: Cathy Glover is a 65 y.o. female  Principal Problem:   Neutropenic sepsis ([place]) Active Problems:   Primary cancer of right lower lobe of lung (HCC)   Hyponatremia   Hypokalemia   Antineoplastic chemotherapy induced pancytopenia (HCC)   DVT (deep venous thrombosis) (HCC)   Cellulitis of periorbital region of both eyes    Subjective: Pt doing better Pain eyes better Opening well No fever  Medications:   apixaban  5 mg Oral BID   budesonide (PULMICORT) nebulizer solution  0.25 mg Nebulization BID   dexamethasone  1 mg Oral BID   dextromethorphan-guaiFENesin  1 tablet Oral BID   moxifloxacin  1 drop Both Eyes TID   Tbo-filgastrim (GRANIX) SQ  300 mcg Subcutaneous Daily   umeclidinium-vilanterol  1 puff Inhalation Daily    Objective: Vital signs in last 24 hours: Temp:  [97.4 F (36.3 C)-98.6 F (37 C)] 98.6 F (37 C) (08/11 1119) Pulse Rate:  [85-96] 96 (08/11 1119) Resp:  [16-18] 16 (08/11 1119) BP: (118-152)/(70-81) 149/80 (08/11 1119) SpO2:  [88 %-98 %] 96 % (08/11 1119)  PHYSICAL EXAM:  General: Alert, cooperative, no distress, appears stated age.  Head: Normocephalic, without obvious abnormality, atraumatic. Eyes: Conjunctivae clear, anicteric sclerae. Pupils are equal, eschar/ecthyema on the inner canthus  ENT Nares normal. No drainage or sinus tenderness. Lips, mucosa, and tongue normal. No Thrush Ecthyema/eschar philtrum today  admission   Neck: Supple, symmetrical, no adenopathy, thyroid: non tender no carotid bruit and no JVD. Back: No CVA tenderness. Lungs: Clear to auscultation bilaterally. No Wheezing or Rhonchi. No rales. Heart: Regular rate and rhythm, no murmur, rub or gallop PORT in place. Abdomen: Soft, non-tender,not distended. Bowel sounds normal. No masses Extremities: atraumatic, no cyanosis. No edema. No clubbing Skin: No rashes or lesions. Or bruising Lymph: Cervical, supraclavicular  normal. Neurologic: Grossly non-focal  Lab Results Recent Labs    06/20/21 0825 06/20/21 1124 06/21/21 0539  WBC 7.2  --  12.5*  HGB 8.4*  --  8.0*  HCT 24.9*  --  24.4*  NA 136 136 135  K 6.1* 5.3* 4.3  CL 102 107 103  CO2 23 21* 24  BUN 18 18 16   CREATININE 0.65 0.55 0.64  Microbiology: 06/17/21 [facility] 4/4 pseudomonas 06/19/21 [facility]    Assessment/Plan: Febrile neutropenia following chemo Pseudomonas bacteremia with infection face-  Blepharitis eyelids Ecthyma/eschar inner eyes and philtrum Pt on IV cefepime day 5  Thrombosis near portacath on eliquis Watch closely for any infection at the PORT site  Fever, neutropenia and bacteremia resolved She will be discharged on PO cipro 750mg  BID for 9 more days for a total of 14 days. Informed patient and husband about side effects of cipro and what to look for including pain muscles, tendons tendinitis, tendon rupture Follow up appt with me in 1 week Discussed the management with patient and hospitalist and oncologist

## 2021-06-21 NOTE — Evaluation (Signed)
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 Found to have sepsis with bilateral periorbital cellulitis with bacterial conjunctivitis, hypokalemia/hyperkalemia, hyponatremia, subocclusive thrombus in the right IJ, taking Eliquis.   Clinical Impression  Patient agreeable to PT. Patient is oriented x 4, able to follow commands consistently with a delay. Patient reports her spouse helped her walk to the bathroom last night and assists her as needed with mobility at home.   She reports no pain at rest or with mobility. No physical assistance required for bed mobility. Min guard assistance provided for sit to stand transfers from bed. Patient ambulated 59ft with rolling walker with Min guard initially progressing to supervision with increased ambulation distance. Patient prefers to have at least unilateral UE support with functional standing activity, so recommended rolling walker for ambulation for safety and fall prevention. Patient was fatigued after walking with limited endurance overall, Sp02 94% on room air, heart rate 109bpm after walking.   Recommend PT follow up to maximize independence and facilitate return to prior level of function. Anticipate patient will be able to return home at discharge with support from family.     Follow Up Recommendations Home health PT;Supervision - Intermittent    Equipment Recommendations  Rolling walker with 5" wheels    Recommendations for Other Services       Precautions / Restrictions  Precautions Precautions: Fall Restrictions Weight Bearing Restrictions: No      Mobility  Bed Mobility Overal bed mobility: Modified Independent             General bed mobility comments: head of bed elevated    Transfers Overall transfer level: Needs assistance Equipment used: None Transfers: Sit to/from Stand Sit to Stand: Min guard         General transfer comment: Min guard for safety. 2 standing bouts performed  Ambulation/Gait Ambulation/Gait assistance: Min guard;Supervision Gait Distance (Feet): 60 Feet Assistive device: Rolling walker (2 wheeled) Gait Pattern/deviations: Step-through pattern;Narrow base of support Gait velocity: decreased   General Gait Details: no dizziness is reported with upright activity. patient reaches out for UE support initially with standing, therefore rolling walker recommended for safety with ambulation. patient walked with Min guard initially progressing to supervision with increased walking distance. patient demonstrated understanding of proper use of rolling walker with initial verbal cues. patient is fatigued with activity. Sp02 94% on room air and heart rate 109bpm after walking.  Stairs            Wheelchair Mobility    Modified Rankin (Stroke Patients Only)       Balance Overall balance assessment: Needs assistance Sitting-balance support: Feet supported Sitting balance-Leahy Scale: Good     Standing balance support: Bilateral upper extremity supported Standing balance-Leahy Scale: Fair Standing balance comment: patient prefers to have at least unilateral UE support with standing due to self reported fear of falling (no dizziness reported)                             Pertinent Vitals/Pain Pain Assessment:  No/denies pain    Home Living Family/patient expects to be discharged to:: Private residence Living Arrangements: Spouse/significant other Available Help at Discharge: Family;Available  PRN/intermittently Type of Home: House Home Access: Stairs to enter   Entrance Stairs-Number of Steps: 3 Home Layout: One level Home Equipment: Cane - quad      Prior Function Level of Independence: Needs assistance   Gait / Transfers Assistance Needed: patient reports she is mostly independent without assistive device but spouse assists PRN as needed  ADL's / Homemaking Assistance Needed: spouse assists when needed  Comments: patient was working up until July. spouse works during the day but son is available to help if needed also     Hand Dominance   Dominant Hand: Right    Extremity/Trunk Assessment   Upper Extremity Assessment Upper Extremity Assessment: Overall WFL for tasks assessed    Lower Extremity Assessment Lower Extremity Assessment: Generalized weakness       Communication   Communication: No difficulties  Cognition Arousal/Alertness: Awake/alert Behavior During Therapy: Flat affect Overall Cognitive Status: No family/caregiver present to determine baseline cognitive functioning                                 General Comments: patient oriented x 4. patient is able to follow single step commands consistently but with delay.      General Comments      Exercises     Assessment/Plan    PT Assessment Patient needs continued PT services  PT Problem List Decreased strength;Decreased activity tolerance;Decreased balance;Decreased mobility;Decreased safety awareness;Decreased knowledge of use of DME       PT Treatment Interventions DME instruction;Gait training;Stair training;Functional mobility training;Therapeutic activities;Therapeutic exercise;Balance training    PT Goals (Current goals can be found in the Care Plan section)  Acute Rehab PT Goals Patient Stated Goal: to walk without a walker, to go home, to have her spouse informed of the plan PT Goal Formulation: With patient Time For Goal Achievement: 07/05/21 Potential to  Achieve Goals: Good    Frequency Min 2X/week   Barriers to discharge        Co-evaluation               AM-PAC PT "6 Clicks" Mobility  Outcome Measure Help needed turning from your back to your side while in a flat bed without using bedrails?: None Help needed moving from lying on your back to sitting on the side of a flat bed without using bedrails?: A Little Help needed moving to and from a bed to a chair (including a wheelchair)?: A Little Help needed standing up from a chair using your arms (e.g., wheelchair or bedside chair)?: A Little Help needed to walk in hospital room?: A Little Help needed climbing 3-5 steps with a railing? : A Little 6 Click Score: 19    End of Session Equipment Utilized During Treatment: Gait belt Activity Tolerance: Patient tolerated treatment well Patient left: in bed;with call bell/phone within reach;with bed alarm set Nurse Communication: Mobility status PT Visit Diagnosis: Unsteadiness on feet (R26.81);Muscle weakness (generalized) (M62.81)    Time: 2297-9892 PT Time Calculation (min) (ACUTE ONLY): 28 min   Charges:   PT Evaluation $PT Eval Moderate Complexity: 1 Mod PT Treatments $Therapeutic Activity: 8-22 mins        Minna Merritts, PT, MPT   Percell Locus 06/21/2021, 11:51 AM

## 2021-06-21 NOTE — Discharge Summary (Addendum)
Physician Discharge Summary  Cathy Glover UDJ:[id number] DOB: 1956/05/25 DOA: 06/17/2021  PCP: Merryl Hacker, No  Admit date: 06/17/2021 Discharge date: 06/21/2021  Admitted From: Home Discharge disposition: Home with home health PT, DME   Code Status: Full Code   Discharge Diagnosis:   Principal Problem:   Neutropenic sepsis ([place]) Active Problems:   Primary cancer of right lower lobe of lung ([place])   Hyponatremia   Hypokalemia   Antineoplastic chemotherapy induced pancytopenia ([place])   DVT (deep venous thrombosis) ([place])   Cellulitis of periorbital region of both eyes    Chief Complaint  Patient presents with   Code Sepsis    Brief narrative: Cathy Glover is a 65 y.o. 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 Patient denies any trauma to her eyelids and denies having any pain in her eyes.  In the ED, she was noted to have significant bilateral leg swelling left more than right with mucus secretion causing the eyes to mat.  Labs showed severe neutropenia, hemoglobin low at 9, platelets low in the 60s CT of the orbits showed moderate swelling/thickening of the preseptal soft tissue bilaterally left more than right with no fluid collection or postoperative inflammation it also showed chronically opacified fluid in the right middle ear cavity, and incidental subocclusive thrombus of the right internal jugular vein. Admitted to hospitalist service. Blood cultures sent on admission showed significant growth of Pseudomonas aeruginosa Oncology, ID and ophthalmology consultation were obtained See below for details   Subjective: Patient was seen and examined this morning. Elderly Caucasian  female.  Lying in bed.  Not in distress.  Feels better than at presentation.  Looks tired.  Hospital course: Sepsis - POA Bilateral periorbital cellulitis with bacterial conjunctivitis -ID, ophthalmology consult appreciated -On Vigamox 1 GTT 3 times daily OU for 2 weeks -Recommend warm compresses and lid scrubs twice daily for 4 days -May follow[facility] at the [facility] as outpatient    Pseudomonas bacteremia -Currently on IV cefepime.  Per infectious disease recommendation, will discharge on ciprofloxacin 750 mg twice daily for 9 more days to complete 2 weeks course of antibiotics.  Probiotics along with.   Pancytopenia secondary to antineoplastic chemotherapy -Given Granix daily per oncology.  All cell lines improving. Recent Labs  Lab 06/17/21 1104 06/18/21 0717 06/19/21 0133 06/20/21 0825 06/21/21 0539  WBC 0.8* 1.1* 2.0* 7.2 12.5*  NEUTROABS 0.4*  --   --  4.9 9.7*  HGB 9.7* 9.6* 8.3* 8.4* 8.0*  HCT 27.1* 27.0* 23.5* 24.9* 24.4*  MCV 88.9 90.6 90.0 93.3 95.7  PLT 81* 68* 72* 90* 95*   Hyponatremia -Sodium level was low at 126 at presentation.  Gradually improved to normal with hydration.    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  Per oncology recommendation, will discharge on 1 mg twice daily for a week.  She will follow-up with oncology as an outpatient for weaning off   Subocclusive thrombus in the right IJ -incidental finding on CT of the orbit. -Ultrasound of right IJ  was positive for DVT of the right IJ vein with nonocclusive thrombus.  It also showed a nonocclusive thrombus in the proximal right subclavian vein, superficial nonocclusive venous thrombosis in the right cephalic vein. -Started on Eliquis 5 mg daily by oncology.   Allergies as of 06/21/2021   No Known Allergies      Medication List     STOP taking these medications    ondansetron 8 MG tablet Commonly known as: ZOFRAN   prochlorperazine 10 MG tablet Commonly known as: COMPAZINE       TAKE these medications    albuterol 108 (90 Base) MCG/ACT inhaler Commonly known as: VENTOLIN HFA Inhale 1-2 puffs into the lungs every 6 (six) hours as needed for wheezing or shortness of breath.   apixaban 5 MG Tabs tablet Commonly known as: ELIQUIS Take 1 tablet (5 mg total) by mouth 2 (two) times daily.   Breztri Aerosphere 160-9-4.8 MCG/ACT Aero Generic drug: Budeson-Glycopyrrol-Formoterol Inhale 2 puffs into the lungs in the morning and at bedtime.   ciprofloxacin 750 MG tablet Commonly known as: CIPRO Take 1 tablet (750 mg total) by mouth 2 (two) times daily for 9 days.   dexamethasone 1 MG tablet Commonly known as: DECADRON Take 1 tablet (1 mg total) by mouth 2 (two) times daily for 7 days. What changed:  medication strength how much to take   dextromethorphan-guaiFENesin 30-600 MG 12hr tablet Commonly known as: MUCINEX DM Take 1 tablet by mouth 2 (two) times daily.   lidocaine-prilocaine cream Commonly known as: EMLA Apply 1 application topically as needed.   loperamide 2 MG tablet Commonly known as: IMODIUM A-D Take 2 mg by mouth as needed for diarrhea or loose stools.   moxifloxacin 0.5 % ophthalmic solution Commonly known as: VIGAMOX Place 1 drop into both eyes 3 (three) times daily for 14 days.   saccharomyces boulardii 250 MG capsule Commonly known as: FLORASTOR Take 1 capsule (250 mg total) by mouth 2 (two) times daily for 14 days.                Durable Medical Equipment  (From admission, onward)           Start     Ordered   06/21/21 1451  For home use only DME Walker rolling  Once       Question Answer Comment  Walker: With [place] Wheels   Patient needs a walker to treat with the following condition Impaired mobility      06/21/21 1450   06/19/21 1439  For home use only DME Hospital bed  Once       Question Answer Comment  Length of Need Lifetime   The above medical condition requires: Patient requires the ability to reposition frequently   Head must be elevated greater than: 45 degrees   Bed type Semi-electric   Hoyer Lift Yes   Trapeze Bar Yes   Support Surface: Alternating Pressure Pad and Pump      06/19/21 1439            Discharge Instructions:  Diet Recommendation: Regular diet   Follow with Primary MD Pcp, No in 7 days   Get CBC/BMP checked in next visit  within 1 week by PCP or SNF MD ( we routinely change or add medications that can affect your baseline labs and fluid status, therefore we recommend that you get the mentioned basic workup next visit with your PCP, your PCP may decide not to get them or add new tests based on their clinical decision)  On your next visit with your PCP, please Get Medicines reviewed and adjusted.  Please request your PCP  to go over all Hospital Tests and Procedure/Radiological results at the follow up, please get all Hospital records sent to your Prim MD by signing hospital release before you go home.  Activity: As tolerated with Full fall precautions use walker/cane & assistance as needed  For Heart failure patients - Check your Weight same time everyday, if you gain over 2 pounds, or you develop in leg swelling, experience more shortness of breath or chest pain, call your Primary MD immediately. Follow Cardiac Low Salt Diet and 1.5 lit/day fluid restriction.  If you have smoked or chewed Tobacco in the last 2 yrs please stop smoking, stop any regular Alcohol  and  or any Recreational drug use.  If you experience worsening of your admission symptoms, develop shortness of breath, life threatening emergency, suicidal or homicidal thoughts you must seek medical attention immediately by calling 911 or calling your MD immediately  if symptoms less severe.  You Must read complete instructions/literature along with all the possible adverse reactions/side effects for all the Medicines you take and that have been prescribed to you. Take any new Medicines after you have completely understood and accpet all the possible adverse reactions/side effects.   Do not drive, operate heavy machinery, perform activities at heights, swimming or participation in water activities or provide baby sitting services if your were admitted for syncope or siezures until you have seen by Primary MD or a Neurologist and advised to do so again.  Do not drive when taking Pain medications.  Do not take more than prescribed Pain, Sleep and Anxiety Medications  Wear Seat belts while driving.   Please note You were cared for by a hospitalist during your hospital stay. If you have any questions about your discharge medications or the care you received while you were in the hospital after you are discharged, you can call the unit and asked to speak with the hospitalist on call if the hospitalist that took care of you is not available. Once you are discharged, your primary care physician will handle any further medical issues. Please note that NO REFILLS for any discharge medications will be authorized once you are discharged, as it is imperative that you return to your primary care physician (or establish a relationship with a primary care physician if you do not have one) for your aftercare needs so that they can reassess your need for medications and monitor your lab values.    Follow ups:    [facility]-up [facility] up.   Contact  information: [place] [address] [phone]        Cammie Sickle, MD Follow up.   Specialties: Internal Medicine, Oncology Contact information: [place] [address] [phone]         Birder Robson, MD. Call.   Specialty: Ophthalmology Contact information: [place] [address] [phone]                 Wound care:  Discharge Exam:   Vitals:   06/21/21 0117 06/21/21 0348 06/21/21 0805 06/21/21 1119  BP: (!) 146/79 139/73 (!) 152/74 (!) 149/80  Pulse: 91 88 85 96  Resp: 18 18 16 16   Temp: (!) 97.5 F (36.4 C) (!) 97.4 F (36.3 C) (!) 97.5 F (36.4 C) 98.6 F (37 C)  TempSrc: Oral Oral Oral Oral  SpO2: 98% 96% 96% 96%  Weight:      Height:        Body mass index is 22.04 kg/m.  General exam: Pleasant, elderly Caucasian female.  Not in distress Skin: No rashes, lesions or ulcers. HEENT: Atraumatic, normocephalic, no obvious bleeding.  Black eschar/scab on upper lip and inner canthus of left eye. Lungs: Clear to auscultation bilaterally CVS: Regular rate and rhythm, no murmur GI/Abd soft, nontender, nondistended, bowel sound present CNS: Alert, awake, oriented x3 Psychiatry: Mood appropriate Extremities: No pedal edema, no calf tenderness  Time coordinating discharge: 35 minutes   The results of significant diagnostics from this hospitalization (including imaging, microbiology, ancillary and laboratory) are listed below for reference.    Procedures and Diagnostic Studies:   US Venous Img Upper Uni Right(DVT)  Result Date: 06/18/2021 CLINICAL DATA:  Thrombus in right internal jugular vein. Right internal jugular Port-A-Cath. EXAM: RIGHT UPPER EXTREMITY VENOUS DOPPLER ULTRASOUND TECHNIQUE: Gray-scale sonography with graded compression, as well as color Doppler and duplex ultrasound were performed to evaluate the upper extremity deep venous system  from the level of the subclavian vein and including the jugular, axillary, basilic, radial, ulnar and upper cephalic vein. Spectral Doppler was utilized to evaluate flow at rest and with distal augmentation maneuvers. COMPARISON:  CT 06/17/2021 FINDINGS: Contralateral Subclavian Vein: No evidence of thrombus. Normal color Doppler flow and phasicity. Internal Jugular Vein: Positive for thrombus. Echogenic nonocclusive thrombus in the right internal jugular vein compatible with recent CT findings. Subclavian Vein: Positive for thrombus. Evidence for nonocclusive thrombus in the proximal right subclavian region. Distal right subclavian vein has normal compressibility and color Doppler flow and augmentation. Axillary Vein: No evidence of thrombus. Normal compressibility and color Doppler flow. Cephalic Vein: Positive for thrombus. Small amount echogenic thrombus and incomplete compressibility of the distal cephalic vein in the right upper arm. Basilic Vein: Negative for thrombus. Normal compressibility and color Doppler flow. Brachial Veins: Negative for thrombus. Normal compressibility and color Doppler flow. Radial Veins: Negative for thrombus. Normal compressibility and color Doppler flow. Ulnar Veins: Negative for thrombus. Normal compressibility and color Doppler flow. Other Findings:  None visualized. IMPRESSION: 1. Positive for deep venous thrombosis involving the right internal jugular vein. This thrombus is nonocclusive. In addition, there is nonocclusive thrombus in the proximal right subclavian vein probably near the right innominate vein junction. 2. Positive for superficial venous thrombosis in the right cephalic vein. Right cephalic vein thrombus is nonocclusive. Electronically Signed   By: Markus Daft M.D.   On: 06/18/2021 09:44   CT Maxillofacial W Contrast  Result Date: 06/17/2021 CLINICAL DATA:  Maxillofacial pain Swelling in the eyelids. History of metastatic lung cancer. EXAM: CT MAXILLOFACIAL WITH  CONTRAST TECHNIQUE: Multidetector CT imaging of the maxillofacial structures was performed with intravenous contrast. Multiplanar CT image reconstructions were also generated. CONTRAST:  36mL OMNIPAQUE IOHEXOL 350 MG/ML SOLN COMPARISON:  Head MRI 05/04/2021.  PET-CT 05/03/2021. FINDINGS: Osseous: No acute fracture or destructive osseous process. Edentulous. Orbits: Moderate swelling/thickening of the preseptal soft tissues bilaterally, left greater than right. No fluid collection, postseptal inflammation, or intraorbital mass. Grossly intact globes. Sinuses:  Small volume fluid in the left sphenoid sinus. Underdeveloped right mastoid air cells which are partially chronically opacified with persistent fluid in the right middle ear cavity as well. Clear left mastoid air cells. Soft tissues: Soft tissue swelling involving the upper lip. Partially visualized subocclusive thrombus in the right internal jugular vein in the mid and lower neck above a Port-A-Cath. Partially visualized lymphadenopathy in the left lower neck and left supraclavicular region with the largest node covered on today's study measuring 1.1 cm in short axis, overall smaller than on the prior PET-CT. Limited intracranial: Mild vasogenic edema in the right temporoparietal region, less than on the prior MRI and with the known underlying mass not well demonstrated on this maxillofacial CT. IMPRESSION: 1. Bilateral periorbital and upper lip soft tissue swelling. No evidence of postseptal cellulitis or abscess. 2. Partially visualized subocclusive thrombus in the right internal jugular vein above a Port-A-Cath. 3. Partially visualized left lower neck and left supraclavicular lymphadenopathy, likely improved from the prior PET-CT. 4. Decreased right temporoparietal vasogenic edema. Electronically Signed   By: Logan Bores M.D.   On: 06/17/2021 13:04   DG Chest Portable 1 View  Result Date: 06/17/2021 CLINICAL DATA:  Sepsis, lung cancer, ongoing  chemotherapy EXAM: PORTABLE CHEST 1 VIEW COMPARISON:  04/12/2021 FINDINGS: The heart size and mediastinal contours are within normal limits. Right chest port catheter. Irregular and nodular opacity projecting the perihilar right lung and right lung base, in keeping with known primary lung malignancy. No new or acute appearing airspace disease. The visualized skeletal structures are unremarkable. IMPRESSION: Irregular and nodular opacity projecting the perihilar right lung and right lung base, in keeping with known primary lung malignancy. No new or acute appearing airspace disease. Electronically Signed   By: Eddie Candle M.D.   On: 06/17/2021 11:46     Labs:   Basic Metabolic Panel: Recent Labs  Lab 06/18/21 0717 06/19/21 0133 06/20/21 0825 06/20/21 1124 06/21/21 0539  NA 131* 132* 136 136 135  K 3.2* 3.2* 6.1* 5.3* 4.3  CL 94* 102 102 107 103  CO2 23 24 23  21* 24  GLUCOSE 152* 172* 191* 181* 188*  BUN 12 19 18 18 16   CREATININE 0.52 0.62 0.65 0.55 0.64  CALCIUM 8.4* 8.2* 8.4* 8.6* 8.0*   GFR Estimated Creatinine Clearance: 47.8 mL/min (by C-G formula based on SCr of 0.64 mg/dL). Liver Function Tests: Recent Labs  Lab 06/17/21 1104  AST 32  ALT 63*  ALKPHOS 134*  BILITOT 0.9  PROT 7.4  ALBUMIN 2.4*   No results for input(s): LIPASE, AMYLASE in the last 168 hours. No results for input(s): AMMONIA in the last 168 hours. Coagulation profile Recent Labs  Lab 06/18/21 0717  INR 1.3*    CBC: Recent Labs  Lab 06/17/21 1104 06/18/21 0717 06/19/21 0133 06/20/21 0825 06/21/21 0539  WBC 0.8* 1.1* 2.0* 7.2 12.5*  NEUTROABS 0.4*  --   --  4.9 9.7*  HGB 9.7* 9.6* 8.3* 8.4* 8.0*  HCT 27.1* 27.0* 23.5* 24.9* 24.4*  MCV 88.9 90.6 90.0 93.3 95.7  PLT 81* 68* 72* 90* 95*   Cardiac Enzymes: No results for input(s): CKTOTAL, CKMB, CKMBINDEX, TROPONINI in the last 168 hours. BNP: Invalid input(s): POCBNP CBG: No results for input(s): GLUCAP in the last 168  hours. D-Dimer No results for input(s): DDIMER in the last 72 hours. Hgb A1c No results for input(s): HGBA1C in the last 72 hours. Lipid Profile No results for input(s): CHOL, HDL, LDLCALC, TRIG, CHOLHDL, LDLDIRECT in  the last 72 hours. Thyroid function studies No results for input(s): TSH, T4TOTAL, T3FREE, THYROIDAB in the last 72 hours.  Invalid input(s): FREET3 Anemia work up No results for input(s): VITAMINB12, FOLATE, FERRITIN, TIBC, IRON, RETICCTPCT in the last 72 hours. Microbiology Recent Results (from the past 240 hour(s))  Urine Culture     Status: Abnormal   Collection Time: 06/17/21 11:04 AM   Specimen: Urine, Clean Catch  Result Value Ref Range Status   Specimen Description   Final    URINE, CLEAN CATCH Performed at [facility], [address] [place], [place] [address]    Special Requests   Final    NONE Performed at [facility], [address] [place], [place] [address]    Culture MULTIPLE SPECIES PRESENT, SUGGEST RECOLLECTION (A)  Final   Report Status 06/19/2021 FINAL  Final  Culture, blood (routine x 2)     Status: Abnormal   Collection Time: 06/17/21 11:04 AM   Specimen: BLOOD  Result Value Ref Range Status   Specimen Description   Final    BLOOD  LEFT AC Performed at [facility], [address], [place], [place] [address]    Special Requests   Final    BOTTLES DRAWN AEROBIC AND ANAEROBIC Blood Culture adequate volume Performed at [facility], [address], [place], [place] [address]    Culture  Setup Time   Final    GRAM NEGATIVE RODS IN BOTH AEROBIC AND ANAEROBIC BOTTLES CRITICAL VALUE NOTED.  VALUE IS CONSISTENT WITH PREVIOUSLY REPORTED AND CALLED VALUE. Performed at [facility], [address] [place], [place] [address]    Culture (A)  Final    PSEUDOMONAS AERUGINOSA SUSCEPTIBILITIES PERFORMED ON PREVIOUS CULTURE WITHIN THE LAST 5 DAYS. Performed at [facility], [place] [address], [place], [place] [address]    Report Status 06/20/2021 FINAL  Final  Culture, blood (routine x 2)     Status: Abnormal   Collection Time: 06/17/21 11:04 AM   Specimen: BLOOD  Result Value Ref Range Status   Specimen Description   Final    BLOOD RIGHT WRIST Performed at [facility], [address], [place], [place] [address]    Special Requests   Final    BOTTLES DRAWN AEROBIC AND ANAEROBIC Blood Culture results may not be optimal due to an inadequate volume of blood received in culture bottles Performed at [facility], [address], [place], [place] [address]    Culture  Setup Time   Final    GRAM NEGATIVE RODS IN BOTH AEROBIC AND ANAEROBIC BOTTLES Organism ID to follow CRITICAL RESULT CALLED TO, READ BACK BY AND VERIFIED WITH: JASON ROBINS @0202  06/18/2021 LFD    Culture PSEUDOMONAS AERUGINOSA (A)  Final   Report Status 06/20/2021 FINAL  Final   Organism ID, Bacteria PSEUDOMONAS AERUGINOSA  Final      Susceptibility   Pseudomonas aeruginosa - MIC*    CEFTAZIDIME <=1 SENSITIVE Sensitive     CIPROFLOXACIN <=0.25 SENSITIVE Sensitive     GENTAMICIN <=1 SENSITIVE Sensitive     IMIPENEM 1 SENSITIVE Sensitive     PIP/TAZO <=4 SENSITIVE Sensitive     CEFEPIME 2 SENSITIVE Sensitive     * PSEUDOMONAS AERUGINOSA  Blood Culture ID Panel (Reflexed)     Status: Abnormal   Collection Time: 06/17/21 11:04 AM  Result Value Ref Range Status   Enterococcus faecalis NOT DETECTED NOT DETECTED Final   Enterococcus Faecium NOT DETECTED NOT  DETECTED Final   Listeria monocytogenes NOT DETECTED NOT DETECTED Final   Staphylococcus species NOT DETECTED NOT DETECTED Final   Staphylococcus aureus (BCID) NOT DETECTED NOT DETECTED Final   Staphylococcus epidermidis NOT DETECTED NOT DETECTED Final   Staphylococcus lugdunensis NOT DETECTED NOT DETECTED Final   Streptococcus species NOT DETECTED NOT DETECTED Final   Streptococcus agalactiae NOT DETECTED NOT DETECTED Final    Streptococcus pneumoniae NOT DETECTED NOT DETECTED Final   Streptococcus pyogenes NOT DETECTED NOT DETECTED Final   A.calcoaceticus-baumannii NOT DETECTED NOT DETECTED Final   Bacteroides fragilis NOT DETECTED NOT DETECTED Final   Enterobacterales NOT DETECTED NOT DETECTED Final   Enterobacter cloacae complex NOT DETECTED NOT DETECTED Final   Escherichia coli NOT DETECTED NOT DETECTED Final   Klebsiella aerogenes NOT DETECTED NOT DETECTED Final   Klebsiella oxytoca NOT DETECTED NOT DETECTED Final   Klebsiella pneumoniae NOT DETECTED NOT DETECTED Final   Proteus species NOT DETECTED NOT DETECTED Final   Salmonella species NOT DETECTED NOT DETECTED Final   Serratia marcescens NOT DETECTED NOT DETECTED Final   Haemophilus influenzae NOT DETECTED NOT DETECTED Final   Neisseria meningitidis NOT DETECTED NOT DETECTED Final   Pseudomonas aeruginosa DETECTED (A) NOT DETECTED Final    Comment: CRITICAL RESULT CALLED TO, READ BACK BY AND VERIFIED WITH: JASON ROBINS @ 0202 06/18/2021 LFD    Stenotrophomonas maltophilia NOT DETECTED NOT DETECTED Final   Candida albicans NOT DETECTED NOT DETECTED Final   Candida auris NOT DETECTED NOT DETECTED Final   Candida glabrata NOT DETECTED NOT DETECTED Final   Candida krusei NOT DETECTED NOT DETECTED Final   Candida parapsilosis NOT DETECTED NOT DETECTED Final   Candida tropicalis NOT DETECTED NOT DETECTED Final   Cryptococcus neoformans/gattii NOT DETECTED NOT DETECTED Final   CTX-M ESBL NOT DETECTED NOT DETECTED Final   Carbapenem resistance IMP NOT DETECTED NOT DETECTED Final   Carbapenem resistance KPC NOT DETECTED NOT DETECTED Final   Carbapenem resistance NDM NOT DETECTED NOT DETECTED Final   Carbapenem resistance VIM NOT DETECTED NOT DETECTED Final    Comment: Performed at [facility], [address] [place], [place] [address]  Resp Panel by RT-PCR (Flu A&B, Covid) Nasopharyngeal Swab     Status: None   Collection Time: 06/17/21  1:00  PM   Specimen: Nasopharyngeal Swab; Nasopharyngeal(NP) swabs in vial transport medium  Result Value Ref Range Status   SARS Coronavirus 2 by RT PCR NEGATIVE NEGATIVE Final    Comment: (NOTE) SARS-CoV-2 target nucleic acids are NOT DETECTED.  The SARS-CoV-2 RNA is generally detectable in upper respiratory specimens during the acute phase of infection. The lowest concentration of SARS-CoV-2 viral copies this assay can detect is 138 copies/mL. A negative result does not preclude SARS-Cov-2 infection and should not be used as the sole basis for treatment or other patient management decisions. A negative result may occur with  improper specimen collection/handling, submission of specimen other than nasopharyngeal swab, presence of viral mutation(s) within the areas targeted by this assay, and inadequate number of viral copies(<138 copies/mL). A negative result must be combined with clinical observations, patient history, and epidemiological information. The expected result is Negative.  Fact Sheet for Patients:  EntrepreneurPulse.com.au  Fact Sheet for Healthcare Providers:  [employer]  This test is no t yet approved or cleared by the [place] FDA and  has been authorized for detection and/or diagnosis of SARS-CoV-2 by FDA under an Emergency Use Authorization (EUA). This EUA will remain  in effect (meaning this  test can be used) for the duration of the COVID-19 declaration under Section 564(b)(1) of the Act, 21 U.S.C.section 360bbb-3(b)(1), unless the authorization is terminated  or revoked sooner.       Influenza A by PCR NEGATIVE NEGATIVE Final   Influenza B by PCR NEGATIVE NEGATIVE Final    Comment: (NOTE) The Xpert Xpress SARS-CoV-2/FLU/RSV plus assay is intended as an aid in the diagnosis of influenza from Nasopharyngeal swab specimens and should not be used as a sole basis for treatment. Nasal washings and aspirates are  unacceptable for Xpert Xpress SARS-CoV-2/FLU/RSV testing.  Fact Sheet for Patients: EntrepreneurPulse.com.au  Fact Sheet for Healthcare Providers: [employer]redible[employer]ployment[employer]be  This test is not yet approved or cleared by the [place] FDA and has been authorized for detection and/or diagnosis of SARS-CoV-2 by FDA under an Emergency Use Authorization (EUA). This EUA will remain in effect (meaning this test can be used) for the duration of the COVID-19 declaration under Section 564(b)(1) of the Act, 21 U.S.C. section 360bbb-3(b)(1), unless the authorization is terminated or revoked.  Performed at [facility], [address] [place], [place] [address]   CULTURE, BLOOD (ROUTINE X 2) w Reflex to ID Panel     Status: None (Preliminary result)   Collection Time: 06/19/21  1:32 AM   Specimen: BLOOD  Result Value Ref Range Status   Specimen Description BLOOD LEFT HAND  Final   Special Requests   Final    BOTTLES DRAWN AEROBIC AND ANAEROBIC Blood Culture adequate volume   Culture   Final    NO GROWTH 2 DAYS Performed at [facility], [address], [place], [place] [address]    Report Status PENDING  Incomplete  CULTURE, BLOOD (ROUTINE X 2) w Reflex to ID Panel     Status: None (Preliminary result)   Collection Time: 06/19/21  1:38 AM   Specimen: BLOOD  Result Value Ref Range Status   Specimen Description BLOOD RIGHT [facility]  Final   Special Requests   Final    BOTTLES DRAWN AEROBIC AND ANAEROBIC Blood Culture adequate volume   Culture   Final    NO GROWTH 2 DAYS Performed at [facility], [address] [place], [place] [address]    Report Status PENDING  Incomplete  Aerobic Culture w Gram Stain (superficial specimen)     Status: None (Preliminary result)   Collection Time: 06/20/21  2:59 PM   Specimen: Wound  Result Value Ref Range Status   Specimen Description   Final    WOUND LIP Performed at [facility], [address], [place], [place] [address]    Special Requests   Final    NONE Performed at [facility], [address] [place], [place] [address]    Gram Stain NO WBC SEEN FEW GRAM POSITIVE COCCI   Final   Culture   Final    CULTURE REINCUBATED FOR BETTER GROWTH Performed at [facility], [place] [address] [place], [place] [address]    Report Status PENDING  Incomplete     Signed: Marlowe Aschoff Ovie Cornelio  [facility] 06/21/2021, 4:12 PM

## 2021-06-21 NOTE — Progress Notes (Signed)
Cathy Glover   DOB:1956/10/01   ZG#:[id number]    Subjective: Patient denies any fevers or chills.  She is able to open her eyes better.  The rash around the eyes is improved.  She is concerned about lack of IV access.  Objective:  Vitals:   06/21/21 1119 06/21/21 1623  BP: (!) 149/80 (!) 156/80  Pulse: 96 91  Resp: 16 18  Temp: 98.6 F (37 C) 97.7 F (36.5 C)  SpO2: 96% 97%     Intake/Output Summary (Last 24 hours) at 06/21/2021 2125 Last data filed at 06/21/2021 1722 Gross per 24 hour  Intake 588.05 ml  Output 900 ml  Net -311.95 ml    Physical Exam Vitals and nursing note reviewed.  Constitutional:      Comments: Patient resting in the bed; no acute distress.    HENT:     Head: Normocephalic and atraumatic.     Mouth/Throat:     Mouth: Mucous membranes are moist.     Pharynx: No oropharyngeal exudate.  Cardiovascular:     Rate and Rhythm: Normal rate and regular rhythm.  Pulmonary:     Effort: No respiratory distress.     Breath sounds: No wheezing.     Comments: Decreased breath sounds bilaterally at bases.  No wheeze or crackles Abdominal:     General: Bowel sounds are normal. There is no distension.     Palpations: Abdomen is soft. There is no mass.     Tenderness: There is no abdominal tenderness. There is no guarding or rebound.  Musculoskeletal:        General: No tenderness. Normal range of motion.     Cervical back: Normal range of motion and neck supple.  Skin:    General: Skin is warm.     Comments: She continues have erythematous swelling around the bilateral eyes.  However she is able to open her eyes better than on admission.  Neurological:     Mental Status: She is alert and oriented to person, place, and time.  Psychiatric:        Mood and Affect: Affect normal.        Judgment: Judgment normal.     Labs:  Lab Results  Component Value Date   WBC 12.5 (H) 06/21/2021   HGB 8.0 (L) 06/21/2021   HCT 24.4 (L) 06/21/2021   MCV 95.7 06/21/2021    PLT 95 (L) 06/21/2021   NEUTROABS 9.7 (H) 06/21/2021    Lab Results  Component Value Date   NA 135 06/21/2021   K 4.3 06/21/2021   CL 103 06/21/2021   CO2 24 06/21/2021    Studies:  DG Fluoro Guide CV Line Right  Result Date: 06/21/2021 CLINICAL DATA:  Small-cell lung carcinoma, status post port catheter placement 06/06/2021. Right IJ nonocclusive thrombus recently noted on ultrasound. Recent inability to aspirate from the port. EXAM: PORT  CATHETER INJECTION UNDER FLUOROSCOPY TECHNIQUE: The procedure, risks (including but not limited to bleeding, infection, organ damage ), benefits, and alternatives were explained to the patient. Questions regarding the procedure were encouraged and answered. The patient understands and consents to the procedure. Survey fluoroscopic inspection reveals stable position of the tunneled power injectable right IJ port catheter, tip at the cavoatrial junction. Note: Blood could be easily aspirated from the port at the beginning and conclusion of the procedure. Injection demonstrates patency of reservoir and catheter tubing. Contrast flows freely into the right atrium. No evidence of fibrin sheath or thrombus. IMPRESSION: 1. Normal port  injection under fluoroscopy. Okay for routine use. If difficulty with aspiration recurs, consider CathFlow dwell per protocol. Electronically Signed   By: Lucrezia Europe M.D.   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  #Metastatic small cell lung cancer- with metastasis to the right hilum/supraclavicular/subpectoral/axillary adenopathy; adrenal metastases and pelvic adenopathy/ brain MRI-metastasis. S/p  carboplatin etoposide approximately 14 days ago.  Given the neutropenia-s/p Granix.  #  Sepsis/bacteremia-Pseudomonas-source of infection is unclear.  CT of the orbits-negative for any obvious cellulitis-cefepime.  Patient planned to be discharged on oral ciprofloxacin 750 twice daily for total of 14 days.  See discussion regarding tendon rupture.   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 ].  Will decrease the dose of dexamethasone to 1 mg oral twice a day-given the risk of tendon rupture/especially on quinolones.  #Incidental nonocclusive thrombus is noted at the right IJ-upper extremity venous Dopplers.  Platelets 90,000 trending up-start Eliquis 5 mg twice daily.  #.  Plan to hold chemotherapy next week; keep appointment as planned/next week and [place].  Discussed with hospitalist/ID doctor.   Cammie Sickle, MD 06/21/2021  9:25 PM

## 2021-06-21 NOTE — Discharge Instructions (Signed)
Eye infection -Continue Vigamox 1 GTT 3 times daily for 2 weeks -Recommend warm compresses and lid scrubs twice daily for 4 days -May follow-up at the [facility] as outpatient

## 2021-06-22 ENCOUNTER — Telehealth: Payer: Self-pay | Admitting: Internal Medicine

## 2021-06-22 NOTE — Telephone Encounter (Signed)
On 8/12-I called; unable to reach patient's husband.  Left a voicemail to call us back if any concerns.  Otherwise patient will keep the appointment as planned next week. GB

## 2021-06-23 LAB — AEROBIC CULTURE W GRAM STAIN (SUPERFICIAL SPECIMEN)
Culture: NORMAL
Gram Stain: NONE SEEN

## 2021-06-24 LAB — CULTURE, BLOOD (ROUTINE X 2)
Culture: NO GROWTH
Culture: NO GROWTH
Special Requests: ADEQUATE
Special Requests: ADEQUATE

## 2021-06-26 ENCOUNTER — Other Ambulatory Visit: Payer: Self-pay

## 2021-06-26 ENCOUNTER — Inpatient Hospital Stay: Payer: 59

## 2021-06-26 ENCOUNTER — Encounter: Payer: Self-pay | Admitting: Internal Medicine

## 2021-06-26 ENCOUNTER — Ambulatory Visit: Payer: 59

## 2021-06-26 ENCOUNTER — Inpatient Hospital Stay (HOSPITAL_BASED_OUTPATIENT_CLINIC_OR_DEPARTMENT_OTHER): Payer: 59 | Admitting: Internal Medicine

## 2021-06-26 DIAGNOSIS — Z86718 Personal history of other venous thrombosis and embolism: Secondary | ICD-10-CM | POA: Diagnosis not present

## 2021-06-26 DIAGNOSIS — C3431 Malignant neoplasm of lower lobe, right bronchus or lung: Secondary | ICD-10-CM

## 2021-06-26 DIAGNOSIS — C3490 Malignant neoplasm of unspecified part of unspecified bronchus or lung: Secondary | ICD-10-CM

## 2021-06-26 DIAGNOSIS — C7931 Secondary malignant neoplasm of brain: Secondary | ICD-10-CM | POA: Diagnosis not present

## 2021-06-26 DIAGNOSIS — A4152 Sepsis due to Pseudomonas: Secondary | ICD-10-CM | POA: Diagnosis not present

## 2021-06-26 DIAGNOSIS — Z87891 Personal history of nicotine dependence: Secondary | ICD-10-CM | POA: Diagnosis present

## 2021-06-26 DIAGNOSIS — R630 Anorexia: Secondary | ICD-10-CM | POA: Diagnosis not present

## 2021-06-26 DIAGNOSIS — Z452 Encounter for adjustment and management of vascular access device: Secondary | ICD-10-CM | POA: Diagnosis not present

## 2021-06-26 DIAGNOSIS — Z7901 Long term (current) use of anticoagulants: Secondary | ICD-10-CM | POA: Diagnosis not present

## 2021-06-26 DIAGNOSIS — R918 Other nonspecific abnormal finding of lung field: Secondary | ICD-10-CM | POA: Diagnosis not present

## 2021-06-26 DIAGNOSIS — E876 Hypokalemia: Secondary | ICD-10-CM | POA: Diagnosis not present

## 2021-06-26 DIAGNOSIS — J449 Chronic obstructive pulmonary disease, unspecified: Secondary | ICD-10-CM | POA: Diagnosis present

## 2021-06-26 DIAGNOSIS — L03811 Cellulitis of head [any part, except face]: Secondary | ICD-10-CM | POA: Diagnosis not present

## 2021-06-26 DIAGNOSIS — Z79899 Other long term (current) drug therapy: Secondary | ICD-10-CM | POA: Diagnosis not present

## 2021-06-26 DIAGNOSIS — R634 Abnormal weight loss: Secondary | ICD-10-CM | POA: Diagnosis not present

## 2021-06-26 LAB — COMPREHENSIVE METABOLIC PANEL
ALT: 30 U/L (ref 0–44)
AST: 23 U/L (ref 15–41)
Albumin: 2.6 g/dL — ABNORMAL LOW (ref 3.5–5.0)
Alkaline Phosphatase: 115 U/L (ref 38–126)
Anion gap: 11 (ref 5–15)
BUN: 14 mg/dL (ref 8–23)
CO2: 27 mmol/L (ref 22–32)
Calcium: 7.9 mg/dL — ABNORMAL LOW (ref 8.9–10.3)
Chloride: 99 mmol/L (ref 98–111)
Creatinine, Ser: 0.97 mg/dL (ref 0.44–1.00)
GFR, Estimated: >60 mL/min (ref >60)
Glucose, Bld: 150 mg/dL — ABNORMAL HIGH (ref 70–99)
Potassium: 2.6 mmol/L — CL (ref 3.5–5.1)
Sodium: 137 mmol/L (ref 135–145)
Total Bilirubin: 0.5 mg/dL (ref 0.3–1.2)
Total Protein: 6.7 g/dL (ref 6.5–8.1)

## 2021-06-26 LAB — CBC WITH DIFFERENTIAL/PLATELET
Abs Immature Granulocytes: 0.96 10*3/uL — ABNORMAL HIGH (ref 0.00–0.07)
Basophils Absolute: 0 10*3/uL (ref 0.0–0.1)
Basophils Relative: 0 %
Eosinophils Absolute: 0 10*3/uL (ref 0.0–0.5)
Eosinophils Relative: 0 %
HCT: 23.1 % — ABNORMAL LOW (ref 36.0–46.0)
Hemoglobin: 7.7 g/dL — ABNORMAL LOW (ref 12.0–15.0)
Immature Granulocytes: 12 %
Lymphocytes Relative: 11 %
Lymphs Abs: 0.8 10*3/uL (ref 0.7–4.0)
MCH: 31 pg (ref 26.0–34.0)
MCHC: 33.3 g/dL (ref 30.0–36.0)
MCV: 93.1 fL (ref 80.0–100.0)
Monocytes Absolute: 0.5 10*3/uL (ref 0.1–1.0)
Monocytes Relative: 6 %
Neutro Abs: 5.5 10*3/uL (ref 1.7–7.7)
Neutrophils Relative %: 71 %
Platelets: 235 10*3/uL (ref 150–400)
RBC Morphology: NONE SEEN
RBC: 2.48 MIL/uL — ABNORMAL LOW (ref 3.87–5.11)
RDW: 15.5 % (ref 11.5–15.5)
WBC: 7.8 10*3/uL (ref 4.0–10.5)
nRBC: 0.6 % — ABNORMAL HIGH (ref 0.0–0.2)

## 2021-06-26 LAB — MAGNESIUM: Magnesium: 1.8 mg/dL (ref 1.7–2.4)

## 2021-06-26 MED ORDER — POTASSIUM CHLORIDE CRYS ER 20 MEQ PO TBCR: EXTENDED_RELEASE_TABLET | ORAL | 3 refills | Status: AC

## 2021-06-26 MED ORDER — SODIUM CHLORIDE 0.9 % IV SOLN
Freq: Once | INTRAVENOUS | Status: AC
  Filled 2021-06-26: qty 250

## 2021-06-26 MED ORDER — SODIUM CHLORIDE 0.9% FLUSH
10.0000 mL | Freq: Once | INTRAVENOUS | Status: DC | PRN
  Filled 2021-06-26: qty 10

## 2021-06-26 MED ORDER — POTASSIUM CHLORIDE 20 MEQ/100ML IV SOLN
20.0000 meq | Freq: Once | INTRAVENOUS | Status: AC
  Administered 2021-06-26: 20 meq via INTRAVENOUS

## 2021-06-26 MED ORDER — HEPARIN SOD (PORK) LOCK FLUSH 100 UNIT/ML IV SOLN
500.0000 [IU] | Freq: Once | INTRAVENOUS | Status: AC | PRN
  Administered 2021-06-26: 500 [IU]
  Filled 2021-06-26: qty 5

## 2021-06-26 MED ORDER — HEPARIN SOD (PORK) LOCK FLUSH 100 UNIT/ML IV SOLN
500.0000 [IU] | Freq: Once | INTRAVENOUS | Status: DC
  Filled 2021-06-26: qty 5

## 2021-06-26 NOTE — Progress Notes (Signed)
[place] NOTE  Patient Care Team: Pcp, No as PCP - General Telford Nab, RN as Oncology Nurse Navigator  CHIEF COMPLAINTS/PURPOSE OF CONSULTATION: lung nodule/mass  #  Oncology History Overview Note  # June 2022-  A. LUNG, RIGHT LOWER LOBE; BIOPSY: [Dr.Gonzalez] SMALL CELL CARCINOMA.   #Stage IV/extensive stage small cell lung cancer-with metastasis to the right hilum/supraclavicular/subpectoral/axillary adenopathy; adrenal metastases and pelvic adenopathy.  June 2022 brain MRI-metastasis [s/p WBRT- 7/22]. S/p  carboplatin etoposide #1-day 1 [because of ongoing whole brain radiation-]  # s/p cycle #2 of carbo-etop-Tecentriq-neutropenic sepsis/Pseudomonas sepsis-orbital cellulitis  # AUG 2022 -neutropenic sepsis Pseudomonas bacteremia orbital cellulitis ; right IJ DVT-on Eliquis   Primary cancer of right lower lobe of lung ([place])  05/01/2021 Initial Diagnosis   Primary cancer of right lower lobe of lung ([place])   05/01/2021 Cancer Staging   Staging form: Lung, AJCC 8th Edition - Clinical: Stage IVB (cT3, cN2, pM1c) - Signed by Cammie Sickle, MD on 05/10/2021 Stage prefix: Initial diagnosis   05/15/2021 -  Chemotherapy    Patient is on Treatment Plan: LUNG SCLC CARBOPLATIN + ETOPOSIDE + ATEZOLIZUMAB INDUCTION Q21D / ATEZOLIZUMAB MAINTENANCE Q21D          HISTORY OF PRESENTING ILLNESS-   Cathy Glover 65 y.o.  female patient with history of smoking [currently quit] metastatic/stage IV small cell lung cancer -s/p [place] radiation; s/p chemotherapy immunotherapy is here for follow-up.  In interim patient was admitted to hospital for Pseudomonas sepsis/orbital cellulitis.  Patient received IV antibiotics inpatient/evaluated by ID.  Patient is currently on ciprofloxacin-rash on the face is improving.  She is on tapering dose of steroids [from recent  WBRT radiation/brain metastases]  Review of Systems  Constitutional:  Positive for malaise/fatigue and  weight loss. Negative for chills, diaphoresis and fever.  HENT:  Negative for nosebleeds and sore throat.   Eyes:  Negative for double vision.  Respiratory:  Positive for cough and shortness of breath. Negative for hemoptysis and sputum production.   Cardiovascular:  Negative for chest pain, palpitations, orthopnea and leg swelling.  Gastrointestinal:  Negative for abdominal pain, blood in stool, constipation, diarrhea, heartburn, melena, nausea and vomiting.  Genitourinary:  Negative for dysuria, frequency and urgency.  Musculoskeletal:  Negative for back pain and joint pain.  Skin: Negative.  Negative for itching and rash.  Neurological:  Negative for dizziness, tingling, focal weakness, weakness and headaches.  Endo/Heme/Allergies:  Does not bruise/bleed easily.  Psychiatric/Behavioral:  Negative for depression. The patient is not nervous/anxious and does not have insomnia.     MEDICAL HISTORY:  Past Medical History:  Diagnosis Date   Cancer [facility])    Pulmonary nodule     SURGICAL HISTORY: Past Surgical History:  Procedure Laterality Date   IR IMAGING GUIDED PORT INSERTION  05/11/2021   VIDEO BRONCHOSCOPY WITH ENDOBRONCHIAL ULTRASOUND N/A 04/25/2021   Procedure: VIDEO BRONCHOSCOPY WITH ENDOBRONCHIAL ULTRASOUND;  Surgeon: Tyler Pita, MD;  Location: [facility] ORS;  Service: Cardiopulmonary;  Laterality: N/A;    SOCIAL HISTORY: Social History   Socioeconomic History   Marital status: Married    Spouse name: Not on file   Number of children: Not on file   Years of education: Not on file   Highest education level: Not on file  Occupational History   Not on file  Tobacco Use   Smoking status: Former    Packs/day: 0.50    Years: 50.00    Pack years: 25.00    Types: Cigarettes  Quit date: 04/11/2021    Years since quitting: 0.2   Smokeless tobacco: Never   Tobacco comments:    Quit one month ago  Vaping Use   Vaping Use: Never used  Substance and Sexual Activity    Alcohol use: Never   Drug use: Never   Sexual activity: Not on file  Other Topics Concern   Not on file  Social History Narrative   Lives in [place] with husband; 2 grown sons; quit smoking- June 2022. No alcohol.    Social Determinants of Health   Financial Resource Strain: Not on file  Food Insecurity: Not on file  Transportation Needs: Not on file  Physical Activity: Not on file  Stress: Not on file  Social Connections: Not on file  Intimate Partner Violence: Not on file    FAMILY HISTORY: Family History  Problem Relation Age of Onset   Cancer Sister     ALLERGIES:  has No Known Allergies.  MEDICATIONS:  Current Outpatient Medications  Medication Sig Dispense Refill   albuterol (VENTOLIN HFA) 108 (90 Base) MCG/ACT inhaler Inhale 1-2 puffs into the lungs every 6 (six) hours as needed for wheezing or shortness of breath. 8 g 0   apixaban (ELIQUIS) 5 MG TABS tablet Take 1 tablet (5 mg total) by mouth 2 (two) times daily. 60 tablet 0   Budeson-Glycopyrrol-Formoterol (BREZTRI AEROSPHERE) 160-9-4.8 MCG/ACT AERO Inhale 2 puffs into the lungs in the morning and at bedtime. 5.9 g 0   dextromethorphan-guaiFENesin (MUCINEX DM) 30-600 MG 12hr tablet Take 1 tablet by mouth 2 (two) times daily.     lidocaine-prilocaine (EMLA) cream Apply 1 application topically as needed. 30 g 0   loperamide (IMODIUM A-D) 2 MG tablet Take 2 mg by mouth as needed for diarrhea or loose stools.     moxifloxacin (VIGAMOX) 0.5 % ophthalmic solution Place 1 drop into both eyes 3 (three) times daily for 14 days. 2.1 mL 0   potassium chloride SA (KLOR-CON) 20 MEQ tablet 1 pill twice a day 30 tablet 3   saccharomyces boulardii (FLORASTOR) 250 MG capsule Take 1 capsule (250 mg total) by mouth 2 (two) times daily for 14 days. 28 capsule 0   No current facility-administered medications for this visit.      Marland Kitchen  PHYSICAL EXAMINATION: ECOG PERFORMANCE STATUS: 1 - Symptomatic but completely ambulatory  Vitals:    06/26/21 0909  BP: 97/76  Pulse: (!) 106  Resp: 14  Temp: 97.7 F (36.5 C)  SpO2: 98%   Filed Weights   06/26/21 0909  Weight: 106 lb 2.4 oz (48.1 kg)    Physical Exam Vitals and nursing note reviewed.  Constitutional:      Comments: Ambulating: In a wheelchair.  Accompanied: with husband.   HENT:     Head: Normocephalic and atraumatic.     Mouth/Throat:     Pharynx: Oropharynx is clear.  Eyes:     Extraocular Movements: Extraocular movements intact.     Pupils: Pupils are equal, round, and reactive to light.  Cardiovascular:     Rate and Rhythm: Normal rate and regular rhythm.  Pulmonary:     Comments: Decreased breath sounds bilaterally.  Abdominal:     Palpations: Abdomen is soft.  Musculoskeletal:        General: Normal range of motion.     Cervical back: Normal range of motion.  Skin:    General: Skin is warm.  Neurological:     General: No focal deficit present.  Mental Status: She is alert and oriented to person, place, and time.  Psychiatric:        Behavior: Behavior normal.        Judgment: Judgment normal.     On 8/16   LABORATORY DATA:  I have reviewed the data as listed Lab Results  Component Value Date   WBC 7.8 06/26/2021   HGB 7.7 (L) 06/26/2021   HCT 23.1 (L) 06/26/2021   MCV 93.1 06/26/2021   PLT 235 06/26/2021   Recent Labs    06/05/21 0946 06/12/21 1325 06/17/21 1104 06/18/21 0717 06/20/21 1124 06/21/21 0539 06/26/21 0901  NA  --  128* 126*   < > 136 135 137  K  --  3.4* 2.7*   < > 5.3* 4.3 2.6*  CL  --  96* 88*   < > 107 103 99  CO2  --  24 25   < > 21* 24 27  GLUCOSE  --  169* 212*   < > 181* 188* 150*  BUN  --  27* 12   < > 18 16 14   CREATININE  --  0.46 0.61   < > 0.55 0.64 0.97  CALCIUM  --  8.0* 8.5*   < > 8.6* 8.0* 7.9*  GFRNONAA  --  >60 >60   < > >60 >60 >60  PROT 6.4* 6.1* 7.4  --   --   --  6.7  ALBUMIN 3.1* 2.8* 2.4*  --   --   --  2.6*  AST 25 27 32  --   --   --  23  ALT 38 26 63*  --   --   --  30   ALKPHOS 57 63 134*  --   --   --  115  BILITOT 0.5 0.6 0.9  --   --   --  0.5  BILIDIR <0.1  --   --   --   --   --   --   IBILI NOT CALCULATED  --   --   --   --   --   --    < > = values in this interval not displayed.    RADIOGRAPHIC STUDIES: I have personally reviewed the radiological images as listed and agreed with the findings in the report. US Venous Img Upper Uni Right(DVT)  Result Date: 06/18/2021 CLINICAL DATA:  Thrombus in right internal jugular vein. Right internal jugular Port-A-Cath. EXAM: RIGHT UPPER EXTREMITY VENOUS DOPPLER ULTRASOUND TECHNIQUE: Gray-scale sonography with graded compression, as well as color Doppler and duplex ultrasound were performed to evaluate the upper extremity deep venous system from the level of the subclavian vein and including the jugular, axillary, basilic, radial, ulnar and upper cephalic vein. Spectral Doppler was utilized to evaluate flow at rest and with distal augmentation maneuvers. COMPARISON:  CT 06/17/2021 FINDINGS: Contralateral Subclavian Vein: No evidence of thrombus. Normal color Doppler flow and phasicity. Internal Jugular Vein: Positive for thrombus. Echogenic nonocclusive thrombus in the right internal jugular vein compatible with recent CT findings. Subclavian Vein: Positive for thrombus. Evidence for nonocclusive thrombus in the proximal right subclavian region. Distal right subclavian vein has normal compressibility and color Doppler flow and augmentation. Axillary Vein: No evidence of thrombus. Normal compressibility and color Doppler flow. Cephalic Vein: Positive for thrombus. Small amount echogenic thrombus and incomplete compressibility of the distal cephalic vein in the right upper arm. Basilic Vein: Negative for thrombus. Normal compressibility and color Doppler flow. Brachial Veins: Negative  for thrombus. Normal compressibility and color Doppler flow. Radial Veins: Negative for thrombus. Normal compressibility and color Doppler flow.  Ulnar Veins: Negative for thrombus. Normal compressibility and color Doppler flow. Other Findings:  None visualized. IMPRESSION: 1. Positive for deep venous thrombosis involving the right internal jugular vein. This thrombus is nonocclusive. In addition, there is nonocclusive thrombus in the proximal right subclavian vein probably near the right innominate vein junction. 2. Positive for superficial venous thrombosis in the right cephalic vein. Right cephalic vein thrombus is nonocclusive. Electronically Signed   By: Markus Daft M.D.   On: 06/18/2021 09:44   CT Maxillofacial W Contrast  Result Date: 06/17/2021 CLINICAL DATA:  Maxillofacial pain Swelling in the eyelids. History of metastatic lung cancer. EXAM: CT MAXILLOFACIAL WITH CONTRAST TECHNIQUE: Multidetector CT imaging of the maxillofacial structures was performed with intravenous contrast. Multiplanar CT image reconstructions were also generated. CONTRAST:  68mL OMNIPAQUE IOHEXOL 350 MG/ML SOLN COMPARISON:  Head MRI 05/04/2021.  PET-CT 05/03/2021. FINDINGS: Osseous: No acute fracture or destructive osseous process. Edentulous. Orbits: Moderate swelling/thickening of the preseptal soft tissues bilaterally, left greater than right. No fluid collection, postseptal inflammation, or intraorbital mass. Grossly intact globes. Sinuses: Small volume fluid in the left sphenoid sinus. Underdeveloped right mastoid air cells which are partially chronically opacified with persistent fluid in the right middle ear cavity as well. Clear left mastoid air cells. Soft tissues: Soft tissue swelling involving the upper lip. Partially visualized subocclusive thrombus in the right internal jugular vein in the mid and lower neck above a Port-A-Cath. Partially visualized lymphadenopathy in the left lower neck and left supraclavicular region with the largest node covered on today's study measuring 1.1 cm in short axis, overall smaller than on the prior PET-CT. Limited intracranial:  Mild vasogenic edema in the right temporoparietal region, less than on the prior MRI and with the known underlying mass not well demonstrated on this maxillofacial CT. IMPRESSION: 1. Bilateral periorbital and upper lip soft tissue swelling. No evidence of postseptal cellulitis or abscess. 2. Partially visualized subocclusive thrombus in the right internal jugular vein above a Port-A-Cath. 3. Partially visualized left lower neck and left supraclavicular lymphadenopathy, likely improved from the prior PET-CT. 4. Decreased right temporoparietal vasogenic edema. Electronically Signed   By: Logan Bores M.D.   On: 06/17/2021 13:04   DG Chest Portable 1 View  Result Date: 06/17/2021 CLINICAL DATA:  Sepsis, lung cancer, ongoing chemotherapy EXAM: PORTABLE CHEST 1 VIEW COMPARISON:  04/12/2021 FINDINGS: The heart size and mediastinal contours are within normal limits. Right chest port catheter. Irregular and nodular opacity projecting the perihilar right lung and right lung base, in keeping with known primary lung malignancy. No new or acute appearing airspace disease. The visualized skeletal structures are unremarkable. IMPRESSION: Irregular and nodular opacity projecting the perihilar right lung and right lung base, in keeping with known primary lung malignancy. No new or acute appearing airspace disease. Electronically Signed   By: Eddie Candle M.D.   On: 06/17/2021 11:46   DG Fluoro Guide CV Line Right  Result Date: 06/21/2021 CLINICAL DATA:  Small-cell lung carcinoma, status post port catheter placement 06/06/2021. Right IJ nonocclusive thrombus recently noted on ultrasound. Recent inability to aspirate from the port. EXAM: PORT  CATHETER INJECTION UNDER FLUOROSCOPY TECHNIQUE: The procedure, risks (including but not limited to bleeding, infection, organ damage ), benefits, and alternatives were explained to the patient. Questions regarding the procedure were encouraged and answered. The patient understands and  consents to the procedure. Survey fluoroscopic inspection  reveals stable position of the tunneled power injectable right IJ port catheter, tip at the cavoatrial junction. Note: Blood could be easily aspirated from the port at the beginning and conclusion of the procedure. Injection demonstrates patency of reservoir and catheter tubing. Contrast flows freely into the right atrium. No evidence of fibrin sheath or thrombus. IMPRESSION: 1. Normal port injection under fluoroscopy. Okay for routine use. If difficulty with aspiration recurs, consider CathFlow dwell per protocol. Electronically Signed   By: Lucrezia Europe M.D.   On: 06/21/2021 15:50   DG Fluoro Guide CV Line Right  Result Date: 06/06/2021 CLINICAL DATA:  Small-cell lung carcinoma. No blood return from port a cath, placed 05/11/2021 by Dr. Earleen Newport. EXAM: PORT  CATHETER INJECTION UNDER FLUOROSCOPY TECHNIQUE: The procedure, risks (including but not limited to bleeding, infection, organ damage ), benefits, and alternatives were explained to the patient. Questions regarding the procedure were encouraged and answered. The patient understands and consents to the procedure. Survey fluoroscopic inspection reveals stable position of right IJ power port, tip at the cavoatrial junction. I was able to easily aspirate blood from the access needle which had been previously placed. Injection demonstrates patency of the reservoir and tubing. No leak. Contrast flows freely from the tip of the catheter into the SVC. No evidence of venous thrombosis or significant fibrin sheath. IMPRESSION: 1. Normal port injection.  Okay for routine use. Consider CathFlow per protocol if aspiration problems persist, as small incomplete fibrin sheaths may be inapparent on injection. Electronically Signed   By: Lucrezia Europe M.D.   On: 06/06/2021 11:40    ASSESSMENT & PLAN:   Primary cancer of right lower lobe of lung ([place]) #Stage IV/extensive stage small cell lung cancer-with metastasis to  the right hilum/supraclavicular/subpectoral/axillary adenopathy; adrenal metastases and pelvic adenopathy.  June 2022 brain MRI-metastasis.  Currently s/p cycle #2 of carbo-etop-Tecentriq-complicated by neutropenic sepsis [see below]  #Hold cycle #3 chemoimmunotherapy because of recent sepsis/declining performance status  #Neutropenic sepsis/orbital cellulitis/Pseudomonas bacteremia-currently on ciprofloxacin [as per ID- 14 days].  Will consider dose reduction in subsequent cycles of chemotherapy; good factor support.  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 ].  Will taper Dex to 1/2 pill in AM; 1/2 pill in PM.   # COPD [quit smoking] continue albuterol inhaler monitor for now  #Right IJ DVT-on Eliquis  #Severe hypokalemia potassium 2.9; recommend potassium supplementation/check mag; home kur.   # DISPOSITION: add Mag to labs;  # 1 lit IVFs; KCl 20 meq today;  # 1 week-MD lab- cbc/bmp; IVFs 1 hour/possible KCL 20 meq-Dr.B  All questions were answered. The patient knows to call the clinic with any problems, questions or concerns.    Cammie Sickle, MD 07/03/2021 8:15 AM

## 2021-06-26 NOTE — Progress Notes (Signed)
Patient has a port a cath. Per v/o Dr. Rogue Bussing - ok to run potassium 20 meq via port over one hour.

## 2021-06-26 NOTE — Assessment & Plan Note (Addendum)
#  Stage IV/extensive stage small cell lung cancer-with metastasis to the right hilum/supraclavicular/subpectoral/axillary adenopathy; adrenal metastases and pelvic adenopathy.  June 2022 brain MRI-metastasis.  Currently s/p cycle #2 of carbo-etop-Tecentriq-complicated by neutropenic sepsis [see below]  #Hold cycle #3 chemoimmunotherapy because of recent sepsis/declining performance status  #Neutropenic sepsis/orbital cellulitis/Pseudomonas bacteremia-currently on ciprofloxacin [as per ID- 14 days].  Will consider dose reduction in subsequent cycles of chemotherapy; good factor support.  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 ].  Will taper Dex to 1/2 pill in AM; 1/2 pill in PM.   # COPD [quit smoking] continue albuterol inhaler monitor for now  #Right IJ DVT-on Eliquis  #Severe hypokalemia potassium 2.9; recommend potassium supplementation/check mag; home kur.   # DISPOSITION: add Mag to labs;  # 1 lit IVFs; KCl 20 meq today;  # 1 week-MD lab- cbc/bmp; IVFs 1 hour/possible KCL 20 meq-Dr.B

## 2021-06-27 ENCOUNTER — Inpatient Hospital Stay: Payer: 59

## 2021-06-28 ENCOUNTER — Other Ambulatory Visit: Payer: Self-pay

## 2021-06-28 ENCOUNTER — Ambulatory Visit: Payer: 59

## 2021-06-28 ENCOUNTER — Encounter: Payer: Self-pay | Admitting: Infectious Diseases

## 2021-06-28 ENCOUNTER — Ambulatory Visit: Payer: 59 | Attending: Infectious Diseases | Admitting: Infectious Diseases

## 2021-06-28 VITALS — BP 92/63 | HR 95 | Temp 97.5°F | Resp 16 | Ht 59.0 in | Wt 104.0 lb

## 2021-06-28 DIAGNOSIS — C7931 Secondary malignant neoplasm of brain: Secondary | ICD-10-CM | POA: Insufficient documentation

## 2021-06-28 DIAGNOSIS — C7970 Secondary malignant neoplasm of unspecified adrenal gland: Secondary | ICD-10-CM | POA: Insufficient documentation

## 2021-06-28 DIAGNOSIS — D649 Anemia, unspecified: Secondary | ICD-10-CM | POA: Diagnosis not present

## 2021-06-28 DIAGNOSIS — K5900 Constipation, unspecified: Secondary | ICD-10-CM | POA: Diagnosis present

## 2021-06-28 DIAGNOSIS — R7881 Bacteremia: Secondary | ICD-10-CM

## 2021-06-28 DIAGNOSIS — Z87891 Personal history of nicotine dependence: Secondary | ICD-10-CM | POA: Diagnosis not present

## 2021-06-28 DIAGNOSIS — Z923 Personal history of irradiation: Secondary | ICD-10-CM | POA: Insufficient documentation

## 2021-06-28 DIAGNOSIS — B965 Pseudomonas (aeruginosa) (mallei) (pseudomallei) as the cause of diseases classified elsewhere: Secondary | ICD-10-CM | POA: Diagnosis not present

## 2021-06-28 DIAGNOSIS — C3490 Malignant neoplasm of unspecified part of unspecified bronchus or lung: Secondary | ICD-10-CM | POA: Insufficient documentation

## 2021-06-28 NOTE — Progress Notes (Signed)
Nutrition Follow-up:  Patient with stage IV small cell lung cancer with metastases to brain.  Patient completed whole brain radiation on 7/22.  Patient receiving chemotherapy.  Recent hospital admission noted.    Called patient for nutrition follow-up.  Patient reports appetite is up and down. Says that foods do not taste right.  Had [employer] sandwich yesterday and tasted burnt.  Denies diarrhea.      Medications: reviewed  Labs: reviewed  Anthropometrics:   Weight 106 lb 2.4  104 lb on 7/14 108 lb 9.6 oz on 6/21 110-111 lb per patient UBW   NUTRITION DIAGNOSIS: Unintentional weight loss stable   INTERVENTION:  Discussed strategies to help with taste change.  Will mail handout.     MONITORING, EVALUATION, GOAL: weight trends, intake   NEXT VISIT: phone call in ~ 4 weeks  Cathy Glover B. Zenia Resides, [place], [place] Registered Dietitian [phone] (mobile)

## 2021-06-28 NOTE — Progress Notes (Signed)
NAME: Cathy Glover  DOB: [birth year]  MRN: [id number]  Date/Time: 06/28/2021 12:09 PM   Subjective:   ? Cathy Glover is a 65 y.o. w female is here with her husband after recent hospital discharge. Patient was recently hospitalized between 06/17/2021 until 06/21/2021 for neutropenic fever, Pseudomonas bacteremia and skin infection of the face.  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  She has been receiving carboplatin etoposide and atezolizumab.  She also has adrenal mets and brain mets and underwent whole brain radiation.  Her last chemo was 06/06/2019 to 12/18/2020.  She presented to the ED on 06/17/2021 with swelling involving both eyelids crusting lesion on the eyelid and a scab on the philtrum area. She was found to be neutropenic with WBC of 0 point.  Platelet was 81 and hemoglobin was 9.7.  CT maxillofacial with contrast showed bilateral periorbital and upper lip soft tissue swelling.  There was no evidence of post septal cellulitis or abscess.  There was a partially visualized subocclusive thrombus in the right internal jugular vein above the Port-A-Cath. Patient received IV Vanco and cefepime while in the hospital.  She was seen by ophthalmology and prescribed eyedrops. She was discharged home on ciprofloxacin to complete 14 days of treatment.  The last date is 07/01/2021. Patient has poor appetite. She is also constipated.  She does not have any fever.  The eyelids and the skin on the face is looking much better.  She also also complaining of swelling of her legs.  Past Medical History:  Diagnosis Date   Cancer [facility])    Pulmonary nodule     Past Surgical History:  Procedure Laterality Date   IR IMAGING GUIDED PORT INSERTION  05/11/2021   VIDEO BRONCHOSCOPY WITH ENDOBRONCHIAL ULTRASOUND N/A 04/25/2021   Procedure: VIDEO BRONCHOSCOPY WITH ENDOBRONCHIAL ULTRASOUND;  Surgeon:  Tyler Pita, MD;  Location: [facility] ORS;  Service: Cardiopulmonary;  Laterality: N/A;    Social History   Socioeconomic History   Marital status: Married    Spouse name: Not on file   Number of children: Not on file   Years of education: Not on file   Highest education level: Not on file  Occupational History   Not on file  Tobacco Use   Smoking status: Former    Packs/day: 0.50    Years: 50.00    Pack years: 25.00    Types: Cigarettes    Quit date: 04/11/2021    Years since quitting: 0.2   Smokeless tobacco: Never   Tobacco comments:    Quit one month ago  Vaping Use   Vaping Use: Never used  Substance and Sexual Activity   Alcohol use: Never   Drug use: Never   Sexual activity: Not on file  Other Topics Concern   Not on file  Social History Narrative   Lives in [place] with husband; 2 grown sons; quit smoking- June 2022. No alcohol.    Social Determinants of Health   Financial Resource Strain: Not on file  Food Insecurity: Not on file  Transportation Needs: Not on file  Physical Activity: Not on file  Stress: Not on file  Social Connections: Not on file  Intimate Partner Violence: Not on file    Family History  Problem Relation Age of Onset   Cancer Sister    No Known Allergies I? Current Outpatient Medications  Medication Sig Dispense Refill  albuterol (VENTOLIN HFA) 108 (90 Base) MCG/ACT inhaler Inhale 1-2 puffs into the lungs every 6 (six) hours as needed for wheezing or shortness of breath. 8 g 0   apixaban (ELIQUIS) 5 MG TABS tablet Take 1 tablet (5 mg total) by mouth 2 (two) times daily. 60 tablet 0   Budeson-Glycopyrrol-Formoterol (BREZTRI AEROSPHERE) 160-9-4.8 MCG/ACT AERO Inhale 2 puffs into the lungs in the morning and at bedtime. 5.9 g 0   ciprofloxacin (CIPRO) 750 MG tablet Take 1 tablet (750 mg total) by mouth 2 (two) times daily for 9 days. 18 tablet 0   dexamethasone (DECADRON) 1 MG tablet Take 1 tablet (1 mg total) by mouth 2 (two) times  daily for 7 days. 14 tablet 0   dextromethorphan-guaiFENesin (MUCINEX DM) 30-600 MG 12hr tablet Take 1 tablet by mouth 2 (two) times daily.     lidocaine-prilocaine (EMLA) cream Apply 1 application topically as needed. 30 g 0   loperamide (IMODIUM A-D) 2 MG tablet Take 2 mg by mouth as needed for diarrhea or loose stools.     moxifloxacin (VIGAMOX) 0.5 % ophthalmic solution Place 1 drop into both eyes 3 (three) times daily for 14 days. 2.1 mL 0   potassium chloride SA (KLOR-CON) 20 MEQ tablet 1 pill twice a day 30 tablet 3   saccharomyces boulardii (FLORASTOR) 250 MG capsule Take 1 capsule (250 mg total) by mouth 2 (two) times daily for 14 days. 28 capsule 0   No current facility-administered medications for this visit.     Abtx:  Anti-infectives (From admission, onward)    None       REVIEW OF SYSTEMS:  Const: negative fever, negative chills, + weight loss Eyes: negative diplopia or visual changes, negative eye pain ENT: negative coryza, negative sore throat Resp: negative cough, hemoptysis, dyspnea Cards: negative for chest pain, palpitations, lower extremity edema GU: negative for frequency, dysuria and hematuria GI: Has constipation and poor appetite Skin: As above Heme: negative for easy bruising and gum/nose bleeding MS: Generalized weakness.  Neurolo:negative for headaches, dizziness, vertigo, memory problems  Psych: negative for feelings of anxiety, depression  Endocrine: negative for thyroid, diabetes Allergy/Immunology- negative for any medication or food allergies ? Objective:  VITALS:  BP 92/63   Pulse 95   Temp (!) 97.5 F (36.4 C) (Oral)   Resp 16   Ht 4\' 11"  (1.499 m)   Wt 104 lb (47.2 kg)   SpO2 95%   BMI 21.01 kg/m  PHYSICAL EXAM:  General: Alert, tired Anemic.  Head: Normocephalic, without obvious abnormality, atraumatic. Eyes: Conjunctivae clear, anicteric sclerae. Pupils are equal Left inner canthus area there is a scab. Today  When she was in  the hospital   ENT Nares normal. No drainage or sinus tenderness. Lips, mucosa, and tongue normal. No Thrush Neck: Supple, symmetrical, no adenopathy, thyroid: non tender no carotid bruit and no JVD. Back: No CVA tenderness. Lungs: Clear to auscultation bilaterally. No Wheezing or Rhonchi. No rales. Heart: Regular rate and rhythm, no murmur, rub or gallop. Abdomen: Soft, non-tender,not distended. Bowel sounds normal. No masses Extremities: atraumatic, no cyanosis. No edema. No clubbing Skin: No rashes or lesions. Or bruising Lymph: Cervical, supraclavicular normal. Neurologic: Grossly non-focal Pertinent Labs Lab Results CBC    Component Value Date/Time   WBC 7.8 06/26/2021 0901   RBC 2.48 (L) 06/26/2021 0901   HGB 7.7 (L) 06/26/2021 0901   HCT 23.1 (L) 06/26/2021 0901   PLT 235 06/26/2021 0901   MCV 93.1 06/26/2021 0901  MCH 31.0 06/26/2021 0901   MCHC 33.3 06/26/2021 0901   RDW 15.5 06/26/2021 0901   LYMPHSABS 0.8 06/26/2021 0901   MONOABS 0.5 06/26/2021 0901   EOSABS 0.0 06/26/2021 0901   BASOSABS 0.0 06/26/2021 0901    CMP Latest Ref Rng & Units 06/26/2021 06/21/2021 06/20/2021  Glucose 70 - 99 mg/dL 150(H) 188(H) 181(H)  BUN 8 - 23 mg/dL 14 16 18   Creatinine 0.44 - 1.00 mg/dL 0.97 0.64 0.55  Sodium 135 - 145 mmol/L 137 135 136  Potassium 3.5 - 5.1 mmol/L 2.6(LL) 4.3 5.3(H)  Chloride 98 - 111 mmol/L 99 103 107  CO2 22 - 32 mmol/L 27 24 21(L)  Calcium 8.9 - 10.3 mg/dL 7.9(L) 8.0(L) 8.6(L)  Total Protein 6.5 - 8.1 g/dL 6.7 - -  Total Bilirubin 0.3 - 1.2 mg/dL 0.5 - -  Alkaline Phos 38 - 126 U/L 115 - -  AST 15 - 41 U/L 23 - -  ALT 0 - 44 U/L 30 - -    ? Impression/Recommendation Stage IV metastatic lung cancer Cerebral mets.  Status post radiation Pseudomonas bacteremia and skin lesions.  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 antibiotic She will complete 14 days of  antipseudomonal therapy.  She is currently on ciprofloxacin and she is got another 48 hours of treatment to go Recent CBC and CMP okay  Anemia  Febrile neutropenia has resolved  Patient is followed by oncology.  Second cycle of chemo will be starting after completion of antibiotic. ? ? ___________________________________________________ Discussed with patient, and her husband in detail about her management She will complete ciprofloxacin on 07/01/2021 Follow-up as needed Note:  This document was prepared using Dragon voice recognition software and may include unintentional dictation errors.

## 2021-06-28 NOTE — Patient Instructions (Addendum)
You are here for follow up for the pseudomonas bacteremia and pseudomonas infection eye and upper lip- you are on ciprofloxacin 750mg  BID and will complete on 06/30/21. You will not need any further antibiotics Your last labs done on 06/26/21 by your oncologist shows a low potassium. Creatinine, Liver, kidney are all fine. You are anemic and you have low protein- you need to eat . Avoid dairy 2 hrs before and 2hrs after taking ciprofloxacin

## 2021-07-03 ENCOUNTER — Other Ambulatory Visit: Payer: Self-pay

## 2021-07-03 ENCOUNTER — Inpatient Hospital Stay: Payer: 59

## 2021-07-03 ENCOUNTER — Inpatient Hospital Stay (HOSPITAL_BASED_OUTPATIENT_CLINIC_OR_DEPARTMENT_OTHER): Payer: 59 | Admitting: Internal Medicine

## 2021-07-03 ENCOUNTER — Encounter: Payer: Self-pay | Admitting: Internal Medicine

## 2021-07-03 ENCOUNTER — Ambulatory Visit: Payer: 59

## 2021-07-03 VITALS — BP 117/65 | HR 119 | Temp 95.5°F | Resp 18 | Ht 59.0 in | Wt 101.0 lb

## 2021-07-03 DIAGNOSIS — R2681 Unsteadiness on feet: Secondary | ICD-10-CM | POA: Diagnosis not present

## 2021-07-03 DIAGNOSIS — R5381 Other malaise: Secondary | ICD-10-CM | POA: Diagnosis not present

## 2021-07-03 DIAGNOSIS — C3431 Malignant neoplasm of lower lobe, right bronchus or lung: Secondary | ICD-10-CM

## 2021-07-03 DIAGNOSIS — C3490 Malignant neoplasm of unspecified part of unspecified bronchus or lung: Secondary | ICD-10-CM

## 2021-07-03 LAB — CBC WITH DIFFERENTIAL/PLATELET
Abs Immature Granulocytes: 0.12 10*3/uL — ABNORMAL HIGH (ref 0.00–0.07)
Basophils Absolute: 0 10*3/uL (ref 0.0–0.1)
Basophils Relative: 0 %
Eosinophils Absolute: 0 10*3/uL (ref 0.0–0.5)
Eosinophils Relative: 0 %
HCT: 23.9 % — ABNORMAL LOW (ref 36.0–46.0)
Hemoglobin: 7.7 g/dL — ABNORMAL LOW (ref 12.0–15.0)
Immature Granulocytes: 2 %
Lymphocytes Relative: 11 %
Lymphs Abs: 0.9 10*3/uL (ref 0.7–4.0)
MCH: 31.3 pg (ref 26.0–34.0)
MCHC: 32.2 g/dL (ref 30.0–36.0)
MCV: 97.2 fL (ref 80.0–100.0)
Monocytes Absolute: 0.3 10*3/uL (ref 0.1–1.0)
Monocytes Relative: 4 %
Neutro Abs: 6.6 10*3/uL (ref 1.7–7.7)
Neutrophils Relative %: 83 %
Platelets: 487 10*3/uL — ABNORMAL HIGH (ref 150–400)
RBC: 2.46 MIL/uL — ABNORMAL LOW (ref 3.87–5.11)
RDW: 17.8 % — ABNORMAL HIGH (ref 11.5–15.5)
WBC: 8 10*3/uL (ref 4.0–10.5)
nRBC: 0 % (ref 0.0–0.2)

## 2021-07-03 LAB — COMPREHENSIVE METABOLIC PANEL
ALT: 17 U/L (ref 0–44)
AST: 22 U/L (ref 15–41)
Albumin: 2.7 g/dL — ABNORMAL LOW (ref 3.5–5.0)
Alkaline Phosphatase: 110 U/L (ref 38–126)
Anion gap: 9 (ref 5–15)
BUN: 17 mg/dL (ref 8–23)
CO2: 25 mmol/L (ref 22–32)
Calcium: 8.7 mg/dL — ABNORMAL LOW (ref 8.9–10.3)
Chloride: 108 mmol/L (ref 98–111)
Creatinine, Ser: 1.59 mg/dL — ABNORMAL HIGH (ref 0.44–1.00)
GFR, Estimated: 36 mL/min — ABNORMAL LOW (ref >60)
Glucose, Bld: 288 mg/dL — ABNORMAL HIGH (ref 70–99)
Potassium: 3.8 mmol/L (ref 3.5–5.1)
Sodium: 142 mmol/L (ref 135–145)
Total Bilirubin: 0.5 mg/dL (ref 0.3–1.2)
Total Protein: 7.1 g/dL (ref 6.5–8.1)

## 2021-07-03 MED ORDER — SODIUM CHLORIDE 0.9 % IV SOLN
Freq: Once | INTRAVENOUS | Status: AC
  Filled 2021-07-03: qty 250

## 2021-07-03 MED ORDER — HEPARIN SOD (PORK) LOCK FLUSH 100 UNIT/ML IV SOLN
500.0000 [IU] | Freq: Once | INTRAVENOUS | Status: AC
  Administered 2021-07-03: 500 [IU] via INTRAVENOUS
  Filled 2021-07-03: qty 5

## 2021-07-03 NOTE — Progress Notes (Signed)
[place] NOTE  Patient Care Team: Pcp, No as PCP - General Telford Nab, RN as Oncology Nurse Navigator  CHIEF COMPLAINTS/PURPOSE OF CONSULTATION: lung cancer  #  Oncology History Overview Note  # June 2022-  A. LUNG, RIGHT LOWER LOBE; BIOPSY: [Dr.Gonzalez] SMALL CELL CARCINOMA.   #Stage IV/extensive stage small cell lung cancer-with metastasis to the right hilum/supraclavicular/subpectoral/axillary adenopathy; adrenal metastases and pelvic adenopathy.  June 2022 brain MRI-metastasis [s/p WBRT- 7/22]. S/p  carboplatin etoposide #1-day 1 [because of ongoing whole brain radiation-]  # s/p cycle #2 of carbo-etop-Tecentriq-neutropenic sepsis/Pseudomonas sepsis-orbital cellulitis  # AUG 2022 -neutropenic sepsis Pseudomonas bacteremia orbital cellulitis ; right IJ DVT-on Eliquis   Primary cancer of right lower lobe of lung ([place])  05/01/2021 Initial Diagnosis   Primary cancer of right lower lobe of lung ([place])   05/01/2021 Cancer Staging   Staging form: Lung, AJCC 8th Edition - Clinical: Stage IVB (cT3, cN2, pM1c) - Signed by Cammie Sickle, MD on 05/10/2021 Stage prefix: Initial diagnosis   05/15/2021 -  Chemotherapy    Patient is on Treatment Plan: LUNG SCLC CARBOPLATIN + ETOPOSIDE + ATEZOLIZUMAB INDUCTION Q21D / ATEZOLIZUMAB MAINTENANCE Q21D          HISTORY OF PRESENTING ILLNESS-   Cathy Glover 65 y.o.  female patient with history of smoking [currently quit] metastatic/stage IV small cell lung cancer -s/p [place] radiation; s/p chemotherapy immunotherapy is here for follow-up.  Patient cycle #3 of chemoimmunotherapy interrupted because of neutropenic sepsis/debility.  Patient currently finished ciprofloxacin-for her Pseudomonas sepsis.  Patient continues to feel weak overall.  Appetite is poor.  Losing weight.  No falls however the high risk.  She walks with a walker.  Review of Systems  Constitutional:  Positive for malaise/fatigue and weight  loss. Negative for chills, diaphoresis and fever.  HENT:  Negative for nosebleeds and sore throat.   Eyes:  Negative for double vision.  Respiratory:  Positive for cough and shortness of breath. Negative for hemoptysis and sputum production.   Cardiovascular:  Negative for chest pain, palpitations, orthopnea and leg swelling.  Gastrointestinal:  Negative for abdominal pain, blood in stool, constipation, diarrhea, heartburn, melena, nausea and vomiting.  Genitourinary:  Negative for dysuria, frequency and urgency.  Musculoskeletal:  Negative for back pain and joint pain.  Skin: Negative.  Negative for itching and rash.  Neurological:  Negative for dizziness, tingling, focal weakness, weakness and headaches.  Endo/Heme/Allergies:  Does not bruise/bleed easily.  Psychiatric/Behavioral:  Negative for depression. The patient is not nervous/anxious and does not have insomnia.     MEDICAL HISTORY:  Past Medical History:  Diagnosis Date   Cancer [facility])    Pulmonary nodule     SURGICAL HISTORY: Past Surgical History:  Procedure Laterality Date   IR IMAGING GUIDED PORT INSERTION  05/11/2021   VIDEO BRONCHOSCOPY WITH ENDOBRONCHIAL ULTRASOUND N/A 04/25/2021   Procedure: VIDEO BRONCHOSCOPY WITH ENDOBRONCHIAL ULTRASOUND;  Surgeon: Tyler Pita, MD;  Location: [facility] ORS;  Service: Cardiopulmonary;  Laterality: N/A;    SOCIAL HISTORY: Social History   Socioeconomic History   Marital status: Married    Spouse name: Not on file   Number of children: Not on file   Years of education: Not on file   Highest education level: Not on file  Occupational History   Not on file  Tobacco Use   Smoking status: Former    Packs/day: 0.50    Years: 50.00    Pack years: 25.00    Types:  Cigarettes    Quit date: 04/11/2021    Years since quitting: 0.2   Smokeless tobacco: Never   Tobacco comments:    Quit one month ago  Vaping Use   Vaping Use: Never used  Substance and Sexual Activity   Alcohol use:  Never   Drug use: Never   Sexual activity: Not on file  Other Topics Concern   Not on file  Social History Narrative   Lives in [place] with husband; 2 grown sons; quit smoking- June 2022. No alcohol.    Social Determinants of Health   Financial Resource Strain: Not on file  Food Insecurity: Not on file  Transportation Needs: Not on file  Physical Activity: Not on file  Stress: Not on file  Social Connections: Not on file  Intimate Partner Violence: Not on file    FAMILY HISTORY: Family History  Problem Relation Age of Onset   Cancer Sister     ALLERGIES:  has No Known Allergies.  MEDICATIONS:  Current Outpatient Medications  Medication Sig Dispense Refill   albuterol (VENTOLIN HFA) 108 (90 Base) MCG/ACT inhaler Inhale 1-2 puffs into the lungs every 6 (six) hours as needed for wheezing or shortness of breath. 8 g 0   apixaban (ELIQUIS) 5 MG TABS tablet Take 1 tablet (5 mg total) by mouth 2 (two) times daily. 60 tablet 0   Budeson-Glycopyrrol-Formoterol (BREZTRI AEROSPHERE) 160-9-4.8 MCG/ACT AERO Inhale 2 puffs into the lungs in the morning and at bedtime. 5.9 g 0   dextromethorphan-guaiFENesin (MUCINEX DM) 30-600 MG 12hr tablet Take 1 tablet by mouth 2 (two) times daily.     lidocaine-prilocaine (EMLA) cream Apply 1 application topically as needed. 30 g 0   loperamide (IMODIUM A-D) 2 MG tablet Take 2 mg by mouth as needed for diarrhea or loose stools.     moxifloxacin (VIGAMOX) 0.5 % ophthalmic solution Place 1 drop into both eyes 3 (three) times daily for 14 days. 2.1 mL 0   potassium chloride SA (KLOR-CON) 20 MEQ tablet 1 pill twice a day 30 tablet 3   saccharomyces boulardii (FLORASTOR) 250 MG capsule Take 1 capsule (250 mg total) by mouth 2 (two) times daily for 14 days. 28 capsule 0   No current facility-administered medications for this visit.      Marland Kitchen  PHYSICAL EXAMINATION: ECOG PERFORMANCE STATUS: 1 - Symptomatic but completely ambulatory  Vitals:   07/03/21  0831  BP: 117/65  Pulse: (!) 119  Resp: 18  Temp: (!) 95.5 F (35.3 C)  SpO2: 97%   Filed Weights   07/03/21 0831  Weight: 100 lb 15.5 oz (45.8 kg)    Physical Exam Vitals and nursing note reviewed.  Constitutional:      Comments: Ambulating: In a wheelchair.  Accompanied: with husband.   HENT:     Head: Normocephalic and atraumatic.     Mouth/Throat:     Pharynx: Oropharynx is clear.  Eyes:     Extraocular Movements: Extraocular movements intact.     Pupils: Pupils are equal, round, and reactive to light.  Cardiovascular:     Rate and Rhythm: Normal rate and regular rhythm.  Pulmonary:     Comments: Decreased breath sounds bilaterally.  Abdominal:     Palpations: Abdomen is soft.  Musculoskeletal:        General: Normal range of motion.     Cervical back: Normal range of motion.  Skin:    General: Skin is warm.  Neurological:     General: No  focal deficit present.     Mental Status: She is alert and oriented to person, place, and time.  Psychiatric:        Behavior: Behavior normal.        Judgment: Judgment normal.     On 8/16   8/23    LABORATORY DATA:  I have reviewed the data as listed Lab Results  Component Value Date   WBC 8.0 07/03/2021   HGB 7.7 (L) 07/03/2021   HCT 23.9 (L) 07/03/2021   MCV 97.2 07/03/2021   PLT 487 (H) 07/03/2021   Recent Labs    06/05/21 0946 06/12/21 1325 06/17/21 1104 06/18/21 0717 06/21/21 0539 06/26/21 0901 07/03/21 0803  NA  --    < > 126*   < > 135 137 142  K  --    < > 2.7*   < > 4.3 2.6* 3.8  CL  --    < > 88*   < > 103 99 108  CO2  --    < > 25   < > 24 27 25   GLUCOSE  --    < > 212*   < > 188* 150* 288*  BUN  --    < > 12   < > 16 14 17   CREATININE  --    < > 0.61   < > 0.64 0.97 1.59*  CALCIUM  --    < > 8.5*   < > 8.0* 7.9* 8.7*  GFRNONAA  --    < > >60   < > >60 >60 36*  PROT 6.4*   < > 7.4  --   --  6.7 7.1  ALBUMIN 3.1*   < > 2.4*  --   --  2.6* 2.7*  AST 25   < > 32  --   --  23 22  ALT 38    < > 63*  --   --  30 17  ALKPHOS 57   < > 134*  --   --  115 110  BILITOT 0.5   < > 0.9  --   --  0.5 0.5  BILIDIR <0.1  --   --   --   --   --   --   IBILI NOT CALCULATED  --   --   --   --   --   --    < > = values in this interval not displayed.    RADIOGRAPHIC STUDIES: I have personally reviewed the radiological images as listed and agreed with the findings in the report. US Venous Img Upper Uni Right(DVT)  Result Date: 06/18/2021 CLINICAL DATA:  Thrombus in right internal jugular vein. Right internal jugular Port-A-Cath. EXAM: RIGHT UPPER EXTREMITY VENOUS DOPPLER ULTRASOUND TECHNIQUE: Gray-scale sonography with graded compression, as well as color Doppler and duplex ultrasound were performed to evaluate the upper extremity deep venous system from the level of the subclavian vein and including the jugular, axillary, basilic, radial, ulnar and upper cephalic vein. Spectral Doppler was utilized to evaluate flow at rest and with distal augmentation maneuvers. COMPARISON:  CT 06/17/2021 FINDINGS: Contralateral Subclavian Vein: No evidence of thrombus. Normal color Doppler flow and phasicity. Internal Jugular Vein: Positive for thrombus. Echogenic nonocclusive thrombus in the right internal jugular vein compatible with recent CT findings. Subclavian Vein: Positive for thrombus. Evidence for nonocclusive thrombus in the proximal right subclavian region. Distal right subclavian vein has normal compressibility and color Doppler flow and augmentation. Axillary Vein: No  evidence of thrombus. Normal compressibility and color Doppler flow. Cephalic Vein: Positive for thrombus. Small amount echogenic thrombus and incomplete compressibility of the distal cephalic vein in the right upper arm. Basilic Vein: Negative for thrombus. Normal compressibility and color Doppler flow. Brachial Veins: Negative for thrombus. Normal compressibility and color Doppler flow. Radial Veins: Negative for thrombus. Normal  compressibility and color Doppler flow. Ulnar Veins: Negative for thrombus. Normal compressibility and color Doppler flow. Other Findings:  None visualized. IMPRESSION: 1. Positive for deep venous thrombosis involving the right internal jugular vein. This thrombus is nonocclusive. In addition, there is nonocclusive thrombus in the proximal right subclavian vein probably near the right innominate vein junction. 2. Positive for superficial venous thrombosis in the right cephalic vein. Right cephalic vein thrombus is nonocclusive. Electronically Signed   By: Markus Daft M.D.   On: 06/18/2021 09:44   CT Maxillofacial W Contrast  Result Date: 06/17/2021 CLINICAL DATA:  Maxillofacial pain Swelling in the eyelids. History of metastatic lung cancer. EXAM: CT MAXILLOFACIAL WITH CONTRAST TECHNIQUE: Multidetector CT imaging of the maxillofacial structures was performed with intravenous contrast. Multiplanar CT image reconstructions were also generated. CONTRAST:  74mL OMNIPAQUE IOHEXOL 350 MG/ML SOLN COMPARISON:  Head MRI 05/04/2021.  PET-CT 05/03/2021. FINDINGS: Osseous: No acute fracture or destructive osseous process. Edentulous. Orbits: Moderate swelling/thickening of the preseptal soft tissues bilaterally, left greater than right. No fluid collection, postseptal inflammation, or intraorbital mass. Grossly intact globes. Sinuses: Small volume fluid in the left sphenoid sinus. Underdeveloped right mastoid air cells which are partially chronically opacified with persistent fluid in the right middle ear cavity as well. Clear left mastoid air cells. Soft tissues: Soft tissue swelling involving the upper lip. Partially visualized subocclusive thrombus in the right internal jugular vein in the mid and lower neck above a Port-A-Cath. Partially visualized lymphadenopathy in the left lower neck and left supraclavicular region with the largest node covered on today's study measuring 1.1 cm in short axis, overall smaller than on  the prior PET-CT. Limited intracranial: Mild vasogenic edema in the right temporoparietal region, less than on the prior MRI and with the known underlying mass not well demonstrated on this maxillofacial CT. IMPRESSION: 1. Bilateral periorbital and upper lip soft tissue swelling. No evidence of postseptal cellulitis or abscess. 2. Partially visualized subocclusive thrombus in the right internal jugular vein above a Port-A-Cath. 3. Partially visualized left lower neck and left supraclavicular lymphadenopathy, likely improved from the prior PET-CT. 4. Decreased right temporoparietal vasogenic edema. Electronically Signed   By: Logan Bores M.D.   On: 06/17/2021 13:04   DG Chest Portable 1 View  Result Date: 06/17/2021 CLINICAL DATA:  Sepsis, lung cancer, ongoing chemotherapy EXAM: PORTABLE CHEST 1 VIEW COMPARISON:  04/12/2021 FINDINGS: The heart size and mediastinal contours are within normal limits. Right chest port catheter. Irregular and nodular opacity projecting the perihilar right lung and right lung base, in keeping with known primary lung malignancy. No new or acute appearing airspace disease. The visualized skeletal structures are unremarkable. IMPRESSION: Irregular and nodular opacity projecting the perihilar right lung and right lung base, in keeping with known primary lung malignancy. No new or acute appearing airspace disease. Electronically Signed   By: Eddie Candle M.D.   On: 06/17/2021 11:46   DG Fluoro Guide CV Line Right  Result Date: 06/21/2021 CLINICAL DATA:  Small-cell lung carcinoma, status post port catheter placement 06/06/2021. Right IJ nonocclusive thrombus recently noted on ultrasound. Recent inability to aspirate from the port. EXAM: PORT  CATHETER  INJECTION UNDER FLUOROSCOPY TECHNIQUE: The procedure, risks (including but not limited to bleeding, infection, organ damage ), benefits, and alternatives were explained to the patient. Questions regarding the procedure were encouraged and  answered. The patient understands and consents to the procedure. Survey fluoroscopic inspection reveals stable position of the tunneled power injectable right IJ port catheter, tip at the cavoatrial junction. Note: Blood could be easily aspirated from the port at the beginning and conclusion of the procedure. Injection demonstrates patency of reservoir and catheter tubing. Contrast flows freely into the right atrium. No evidence of fibrin sheath or thrombus. IMPRESSION: 1. Normal port injection under fluoroscopy. Okay for routine use. If difficulty with aspiration recurs, consider CathFlow dwell per protocol. Electronically Signed   By: Lucrezia Europe M.D.   On: 06/21/2021 15:50   DG Fluoro Guide CV Line Right  Result Date: 06/06/2021 CLINICAL DATA:  Small-cell lung carcinoma. No blood return from port a cath, placed 05/11/2021 by Dr. Earleen Newport. EXAM: PORT  CATHETER INJECTION UNDER FLUOROSCOPY TECHNIQUE: The procedure, risks (including but not limited to bleeding, infection, organ damage ), benefits, and alternatives were explained to the patient. Questions regarding the procedure were encouraged and answered. The patient understands and consents to the procedure. Survey fluoroscopic inspection reveals stable position of right IJ power port, tip at the cavoatrial junction. I was able to easily aspirate blood from the access needle which had been previously placed. Injection demonstrates patency of the reservoir and tubing. No leak. Contrast flows freely from the tip of the catheter into the SVC. No evidence of venous thrombosis or significant fibrin sheath. IMPRESSION: 1. Normal port injection.  Okay for routine use. Consider CathFlow per protocol if aspiration problems persist, as small incomplete fibrin sheaths may be inapparent on injection. Electronically Signed   By: Lucrezia Europe M.D.   On: 06/06/2021 11:40    ASSESSMENT & PLAN:   Primary cancer of right lower lobe of lung ([place]) #Stage IV/extensive stage small  cell lung cancer-with metastasis to the right hilum/supraclavicular/subpectoral/axillary adenopathy; adrenal metastases and pelvic adenopathy.  June 2022 brain MRI-metastasis.  Currently s/p cycle #2 of carbo-etop-Tecentriq-complicated by neutropenic sepsis [see below]  #Hold cycle #3 chemoimmunotherapy because of recent sepsis/declining performance status  #Neutropenic sepsis/orbital cellulitis/Pseudomonas bacteremia-currently on ciprofloxacin [as per ID- 14 days].  Will consider dose reduction in subsequent cycles of chemotherapy; good factor support.  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 ].  currently off steroids.   # Renal- hypokalemia- improved; but creatinine-1.54/acute renal failure: [pre-renal]  # COPD [quit smoking] continue albuterol inhaler monitor for now  #Right IJ DVT-on Eliquis- STABLE.   # weight loss/debility [walker]- multifactorial recommend PT at home.   # DISPOSITION:  # referral to PT re: debility/gait instability # 1 lit IVFs; HOLD KCl 20 meq today;  # 1 week- labs- cbc/bmp; IVFs 1 hour/possible KCL 20 meq # 2 week-MD lab- cbc/bmp; IVFs 1 hour/possible KCL 20 meq-Dr.B  All questions were answered. The patient knows to call the clinic with any problems, questions or concerns.    Cammie Sickle, MD 07/03/2021 9:16 AM

## 2021-07-03 NOTE — Assessment & Plan Note (Addendum)
#  Stage IV/extensive stage small cell lung cancer-with metastasis to the right hilum/supraclavicular/subpectoral/axillary adenopathy; adrenal metastases and pelvic adenopathy.  June 2022 brain MRI-metastasis.  Currently s/p cycle #2 of carbo-etop-Tecentriq-complicated by neutropenic sepsis [see below]  #Hold cycle #3 chemoimmunotherapy because of recent sepsis/declining performance status  #Neutropenic sepsis/orbital cellulitis/Pseudomonas bacteremia-currently on ciprofloxacin [as per ID- 14 days].  Will consider dose reduction in subsequent cycles of chemotherapy; good factor support.  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 ].  currently off steroids.   # Renal- hypokalemia- improved; but creatinine-1.54/acute renal failure: [pre-renal]  # COPD [quit smoking] continue albuterol inhaler monitor for now  #Right IJ DVT-on Eliquis- STABLE.   # weight loss/debility [walker]- multifactorial recommend PT at home.   # DISPOSITION:  # referral to PT re: debility/gait instability # 1 lit IVFs; HOLD KCl 20 meq today;  # 1 week- labs- cbc/bmp; IVFs 1 hour/possible KCL 20 meq # 2 week-MD lab- cbc/bmp; IVFs 1 hour/possible KCL 20 meq-Dr.B

## 2021-07-09 ENCOUNTER — Ambulatory Visit
Admission: RE | Admit: 2021-07-09 | Discharge: 2021-07-09 | Disposition: A | Discharge status: Home / Self Care | Payer: 59 | Source: Ambulatory Visit | Attending: Radiation Oncology | Admitting: Radiation Oncology

## 2021-07-09 ENCOUNTER — Other Ambulatory Visit: Payer: Self-pay | Admitting: *Deleted

## 2021-07-09 ENCOUNTER — Encounter: Payer: Self-pay | Admitting: Radiation Oncology

## 2021-07-09 ENCOUNTER — Other Ambulatory Visit: Payer: Self-pay

## 2021-07-09 VITALS — BP 96/64 | HR 110 | Temp 93.3°F

## 2021-07-09 DIAGNOSIS — C3490 Malignant neoplasm of unspecified part of unspecified bronchus or lung: Secondary | ICD-10-CM

## 2021-07-09 MED ORDER — FLUCONAZOLE 100 MG PO TABS: 100.0000 mg | ORAL_TABLET | Freq: Every day | ORAL | 0 refills | Status: AC

## 2021-07-09 NOTE — Progress Notes (Signed)
Radiation Oncology Follow up Note  Name: Cathy Glover   Date:   07/09/2021 MRN:  [id number] DOB: Mar 26, 1956    This 65 y.o. female presents to the clinic today for 1 month follow-up status post whole brain radiation therapy for extensive stage small cell lung cancer with brain metastasis.  REFERRING PROVIDER: No ref. provider found  HPI: Patient is a 65 year old female now at 1 month having completed palliative radiation therapy to her whole brain for involvement of extensive stage small cell lung cancer.  She recently was hospitalized for sepsis is slowly recovering from that..  She is currently receivingcarbo-etop-Tecentriq although that has been held secondary to neutropenic sepsis.  She is currently off steroids is having no change in neurologic status.  She is having no headaches.  COMPLICATIONS OF TREATMENT: none  FOLLOW UP COMPLIANCE: keeps appointments   PHYSICAL EXAM:  BP 96/64   Pulse (!) 110   Temp (!) 93.3 F (34.1 C)  Patient has oral candidiasis.  Motor or sensory and DTR levels are equal and symmetrical crude visual fields within normal range.  Well-developed well-nourished patient in NAD. HEENT reveals PERLA, EOMI, discs not visualized.  Oral cavity is clear. No oral mucosal lesions are identified. Neck is clear without evidence of cervical or supraclavicular adenopathy. Lungs are clear to A&P. Cardiac examination is essentially unremarkable with regular rate and rhythm without murmur rub or thrill. Abdomen is benign with no organomegaly or masses noted. Motor sensory and DTR levels are equal and symmetric in the upper and lower extremities. Cranial nerves II through XII are grossly intact. Proprioception is intact. No peripheral adenopathy or edema is identified. No motor or sensory levels are noted. Crude visual fields are within normal range.  RADIOLOGY RESULTS: No current films for review  PLAN: Present time she continues under treatment with medical oncology.  I am  going to turn follow-up care over to medical oncology.  I am starting her on Diflucan for 7 days for oral candidiasis.  Happy to reevaluate the patient anytime should further consultation be indicated.  I would like to take this opportunity to thank you for allowing me to participate in the care of your patient.Noreene Filbert, MD

## 2021-07-10 ENCOUNTER — Inpatient Hospital Stay: Payer: 59

## 2021-07-10 VITALS — BP 120/81 | HR 103 | Temp 96.5°F | Resp 18

## 2021-07-10 DIAGNOSIS — C3431 Malignant neoplasm of lower lobe, right bronchus or lung: Secondary | ICD-10-CM

## 2021-07-10 LAB — CBC WITH DIFFERENTIAL/PLATELET
Abs Immature Granulocytes: 0.29 10*3/uL — ABNORMAL HIGH (ref 0.00–0.07)
Basophils Absolute: 0.1 10*3/uL (ref 0.0–0.1)
Basophils Relative: 1 %
Eosinophils Absolute: 0.2 10*3/uL (ref 0.0–0.5)
Eosinophils Relative: 3 %
HCT: 26.8 % — ABNORMAL LOW (ref 36.0–46.0)
Hemoglobin: 8.4 g/dL — ABNORMAL LOW (ref 12.0–15.0)
Immature Granulocytes: 4 %
Lymphocytes Relative: 19 %
Lymphs Abs: 1.3 10*3/uL (ref 0.7–4.0)
MCH: 31.1 pg (ref 26.0–34.0)
MCHC: 31.3 g/dL (ref 30.0–36.0)
MCV: 99.3 fL (ref 80.0–100.0)
Monocytes Absolute: 0.5 10*3/uL (ref 0.1–1.0)
Monocytes Relative: 8 %
Neutro Abs: 4.6 10*3/uL (ref 1.7–7.7)
Neutrophils Relative %: 65 %
Platelets: 738 10*3/uL — ABNORMAL HIGH (ref 150–400)
RBC: 2.7 MIL/uL — ABNORMAL LOW (ref 3.87–5.11)
RDW: 18.1 % — ABNORMAL HIGH (ref 11.5–15.5)
WBC: 7 10*3/uL (ref 4.0–10.5)
nRBC: 0.7 % — ABNORMAL HIGH (ref 0.0–0.2)

## 2021-07-10 LAB — BASIC METABOLIC PANEL
Anion gap: 10 (ref 5–15)
BUN: 23 mg/dL (ref 8–23)
CO2: 22 mmol/L (ref 22–32)
Calcium: 8.9 mg/dL (ref 8.9–10.3)
Chloride: 111 mmol/L (ref 98–111)
Creatinine, Ser: 1.3 mg/dL — ABNORMAL HIGH (ref 0.44–1.00)
GFR, Estimated: 46 mL/min — ABNORMAL LOW (ref >60)
Glucose, Bld: 170 mg/dL — ABNORMAL HIGH (ref 70–99)
Potassium: 3.9 mmol/L (ref 3.5–5.1)
Sodium: 143 mmol/L (ref 135–145)

## 2021-07-10 MED ORDER — HEPARIN SOD (PORK) LOCK FLUSH 100 UNIT/ML IV SOLN
500.0000 [IU] | Freq: Once | INTRAVENOUS | Status: AC | PRN
  Administered 2021-07-10: 500 [IU]
  Filled 2021-07-10: qty 5

## 2021-07-10 MED ORDER — SODIUM CHLORIDE 0.9% FLUSH
10.0000 mL | Freq: Once | INTRAVENOUS | Status: AC | PRN
  Administered 2021-07-10: 10 mL
  Filled 2021-07-10: qty 10

## 2021-07-10 MED ORDER — SODIUM CHLORIDE 0.9 % IV SOLN
Freq: Once | INTRAVENOUS | Status: AC
  Filled 2021-07-10: qty 250

## 2021-07-17 ENCOUNTER — Inpatient Hospital Stay (HOSPITAL_BASED_OUTPATIENT_CLINIC_OR_DEPARTMENT_OTHER): Payer: 59 | Admitting: Internal Medicine

## 2021-07-17 ENCOUNTER — Inpatient Hospital Stay: Payer: 59

## 2021-07-17 ENCOUNTER — Inpatient Hospital Stay: Payer: 59 | Attending: Internal Medicine

## 2021-07-17 ENCOUNTER — Other Ambulatory Visit: Payer: Self-pay

## 2021-07-17 VITALS — BP 95/72 | HR 112

## 2021-07-17 DIAGNOSIS — C3432 Malignant neoplasm of lower lobe, left bronchus or lung: Secondary | ICD-10-CM | POA: Diagnosis not present

## 2021-07-17 DIAGNOSIS — R5383 Other fatigue: Secondary | ICD-10-CM | POA: Diagnosis not present

## 2021-07-17 DIAGNOSIS — Z86718 Personal history of other venous thrombosis and embolism: Secondary | ICD-10-CM | POA: Diagnosis not present

## 2021-07-17 DIAGNOSIS — R634 Abnormal weight loss: Secondary | ICD-10-CM | POA: Insufficient documentation

## 2021-07-17 DIAGNOSIS — C3431 Malignant neoplasm of lower lobe, right bronchus or lung: Secondary | ICD-10-CM

## 2021-07-17 DIAGNOSIS — C7801 Secondary malignant neoplasm of right lung: Secondary | ICD-10-CM | POA: Diagnosis not present

## 2021-07-17 DIAGNOSIS — I82C11 Acute embolism and thrombosis of right internal jugular vein: Secondary | ICD-10-CM

## 2021-07-17 DIAGNOSIS — Z9221 Personal history of antineoplastic chemotherapy: Secondary | ICD-10-CM | POA: Insufficient documentation

## 2021-07-17 DIAGNOSIS — R531 Weakness: Secondary | ICD-10-CM | POA: Insufficient documentation

## 2021-07-17 DIAGNOSIS — C7931 Secondary malignant neoplasm of brain: Secondary | ICD-10-CM | POA: Insufficient documentation

## 2021-07-17 DIAGNOSIS — Z87891 Personal history of nicotine dependence: Secondary | ICD-10-CM | POA: Insufficient documentation

## 2021-07-17 DIAGNOSIS — C7970 Secondary malignant neoplasm of unspecified adrenal gland: Secondary | ICD-10-CM | POA: Diagnosis not present

## 2021-07-17 DIAGNOSIS — R2681 Unsteadiness on feet: Secondary | ICD-10-CM | POA: Insufficient documentation

## 2021-07-17 DIAGNOSIS — Z95828 Presence of other vascular implants and grafts: Secondary | ICD-10-CM

## 2021-07-17 DIAGNOSIS — J449 Chronic obstructive pulmonary disease, unspecified: Secondary | ICD-10-CM | POA: Diagnosis not present

## 2021-07-17 DIAGNOSIS — C3490 Malignant neoplasm of unspecified part of unspecified bronchus or lung: Secondary | ICD-10-CM

## 2021-07-17 LAB — CBC WITH DIFFERENTIAL/PLATELET
Abs Immature Granulocytes: 0.21 10*3/uL — ABNORMAL HIGH (ref 0.00–0.07)
Basophils Absolute: 0.1 10*3/uL (ref 0.0–0.1)
Basophils Relative: 1 %
Eosinophils Absolute: 0.1 10*3/uL (ref 0.0–0.5)
Eosinophils Relative: 2 %
HCT: 30.1 % — ABNORMAL LOW (ref 36.0–46.0)
Hemoglobin: 9.3 g/dL — ABNORMAL LOW (ref 12.0–15.0)
Immature Granulocytes: 3 %
Lymphocytes Relative: 19 %
Lymphs Abs: 1.2 10*3/uL (ref 0.7–4.0)
MCH: 31.1 pg (ref 26.0–34.0)
MCHC: 30.9 g/dL (ref 30.0–36.0)
MCV: 100.7 fL — ABNORMAL HIGH (ref 80.0–100.0)
Monocytes Absolute: 0.5 10*3/uL (ref 0.1–1.0)
Monocytes Relative: 7 %
Neutro Abs: 4.4 10*3/uL (ref 1.7–7.7)
Neutrophils Relative %: 68 %
Platelets: 666 10*3/uL — ABNORMAL HIGH (ref 150–400)
RBC: 2.99 MIL/uL — ABNORMAL LOW (ref 3.87–5.11)
RDW: 18.6 % — ABNORMAL HIGH (ref 11.5–15.5)
WBC: 6.5 10*3/uL (ref 4.0–10.5)
nRBC: 0 % (ref 0.0–0.2)

## 2021-07-17 LAB — COMPREHENSIVE METABOLIC PANEL
ALT: 10 U/L (ref 0–44)
AST: 22 U/L (ref 15–41)
Albumin: 3.2 g/dL — ABNORMAL LOW (ref 3.5–5.0)
Alkaline Phosphatase: 110 U/L (ref 38–126)
Anion gap: 11 (ref 5–15)
BUN: 20 mg/dL (ref 8–23)
CO2: 19 mmol/L — ABNORMAL LOW (ref 22–32)
Calcium: 8.9 mg/dL (ref 8.9–10.3)
Chloride: 109 mmol/L (ref 98–111)
Creatinine, Ser: 0.99 mg/dL (ref 0.44–1.00)
GFR, Estimated: >60 mL/min (ref >60)
Glucose, Bld: 159 mg/dL — ABNORMAL HIGH (ref 70–99)
Potassium: 4.2 mmol/L (ref 3.5–5.1)
Sodium: 139 mmol/L (ref 135–145)
Total Bilirubin: 0.8 mg/dL (ref 0.3–1.2)
Total Protein: 8.3 g/dL — ABNORMAL HIGH (ref 6.5–8.1)

## 2021-07-17 MED ORDER — ALBUTEROL SULFATE HFA 108 (90 BASE) MCG/ACT IN AERS: 1.0000 | INHALATION_SPRAY | Freq: Four times a day (QID) | RESPIRATORY_TRACT | 1 refills | Status: DC | PRN

## 2021-07-17 MED ORDER — LIDOCAINE-PRILOCAINE 2.5-2.5 % EX CREA: 1.0000 "application " | TOPICAL_CREAM | CUTANEOUS | 1 refills | Status: AC | PRN

## 2021-07-17 MED ORDER — APIXABAN 5 MG PO TABS: 5.0000 mg | ORAL_TABLET | Freq: Two times a day (BID) | ORAL | 1 refills | Status: AC

## 2021-07-17 NOTE — Assessment & Plan Note (Addendum)
#  Stage IV/extensive stage small cell lung cancer-with metastasis to the right hilum/supraclavicular/subpectoral/axillary adenopathy; adrenal metastases and pelvic adenopathy.  June 2022 brain MRI-metastasis.  Currently s/p cycle #2 of carbo-etop-Tecentriq-complicated by neutropenic sepsis [see below]- STABLE.   #Hold cycle #3 chemoimmunotherapy because of recent sepsis/declining performance status.  We will repeat scans in 3 weeks.  If no significant improvement in the performance status-we will consider immunotherapy chemotherapy alone.  Add Fulphila.  #Neutropenic sepsis/orbital cellulitis/Pseudomonas bacteremia-currently on ciprofloxacin [as per ID- 14 days]. Resolved.  Will consider dose reduction in subsequent cycles of chemotherapy; good factor support.  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 ]. Steroids off.   # Renal- hypokalemia- improved- STOP [K-4.2]; but creatinine-1.54/acute renal failure: improved creat 0.9.  [pre-renal]  # COPD [quit smoking] continue albuterol inhaler monitor for now; refilled albutreol  #Right IJ DVT-on Eliquis- STABLE.  x3 mnths  # weight loss/debility [walker]- multifactorial recommend PT at home.   # DISPOSITION: de-accessc # referral to PT re: debility/gait instability # No IVFS or K today # 3 week-MD lab- cbc/bmp; IVFs 1 hour/possible KCL 20 meq; possible chemo d-1- carbo-eto;Atezo;  d-2 &3- etop; d-4 fulphila-- CT CAP; MRI brain prior-Dr.B

## 2021-07-17 NOTE — Progress Notes (Signed)
[facility] NOTE  Patient Care Team: Pcp, No as PCP - General Telford Nab, RN as Oncology Nurse Navigator  CHIEF COMPLAINTS/PURPOSE OF CONSULTATION: lung cancer  #  Oncology History Overview Note  # June 2022-  A. LUNG, RIGHT LOWER LOBE; BIOPSY: [Dr.Gonzalez] SMALL CELL CARCINOMA.   #Stage IV/extensive stage small cell lung cancer-with metastasis to the right hilum/supraclavicular/subpectoral/axillary adenopathy; adrenal metastases and pelvic adenopathy.  June 2022 brain MRI-metastasis [s/p WBRT- 7/22]. S/p  carboplatin etoposide #1-day 1 [because of ongoing whole brain radiation-]  # s/p cycle #2 of carbo-etop-Tecentriq-neutropenic sepsis/Pseudomonas sepsis-orbital cellulitis  # AUG 2022 -neutropenic sepsis Pseudomonas bacteremia orbital cellulitis ; right IJ DVT-on Eliquis   Primary cancer of right lower lobe of lung ([place])  05/01/2021 Initial Diagnosis   Primary cancer of right lower lobe of lung ([place])   05/01/2021 Cancer Staging   Staging form: Lung, AJCC 8th Edition - Clinical: Stage IVB (cT3, cN2, pM1c) - Signed by Cammie Sickle, MD on 05/10/2021 Stage prefix: Initial diagnosis   05/15/2021 -  Chemotherapy    Patient is on Treatment Plan: LUNG SCLC CARBOPLATIN + ETOPOSIDE + ATEZOLIZUMAB INDUCTION Q21D / ATEZOLIZUMAB MAINTENANCE Q21D          HISTORY OF PRESENTING ILLNESS-patient in wheelchair.  Accompanied by husband. Cathy Glover 65 y.o.  female patient with history of smoking [currently quit] metastatic/stage IV small cell lung cancer -s/p [place] radiation; s/p chemotherapy immunotherapy is here for follow-up.  Patient cycle #3 of chemoimmunotherapy interrupted because of neutropenic sepsis/debility.  In the interim evaluated by patient oncology-started on Diflucan.  Continues to feel weak.  Again gait is still unsteady.  Walks with a walker.  No falls.  Review of Systems  Constitutional:  Positive for malaise/fatigue and weight loss.  Negative for chills, diaphoresis and fever.  HENT:  Negative for nosebleeds and sore throat.   Eyes:  Negative for double vision.  Respiratory:  Positive for cough and shortness of breath. Negative for hemoptysis and sputum production.   Cardiovascular:  Negative for chest pain, palpitations, orthopnea and leg swelling.  Gastrointestinal:  Negative for abdominal pain, blood in stool, constipation, diarrhea, heartburn, melena, nausea and vomiting.  Genitourinary:  Negative for dysuria, frequency and urgency.  Musculoskeletal:  Negative for back pain and joint pain.  Skin: Negative.  Negative for itching and rash.  Neurological:  Negative for dizziness, tingling, focal weakness, weakness and headaches.  Endo/Heme/Allergies:  Does not bruise/bleed easily.  Psychiatric/Behavioral:  Negative for depression. The patient is not nervous/anxious and does not have insomnia.     MEDICAL HISTORY:  Past Medical History:  Diagnosis Date   Cancer [facility])    Pulmonary nodule     SURGICAL HISTORY: Past Surgical History:  Procedure Laterality Date   IR IMAGING GUIDED PORT INSERTION  05/11/2021   VIDEO BRONCHOSCOPY WITH ENDOBRONCHIAL ULTRASOUND N/A 04/25/2021   Procedure: VIDEO BRONCHOSCOPY WITH ENDOBRONCHIAL ULTRASOUND;  Surgeon: Tyler Pita, MD;  Location: [facility] ORS;  Service: Cardiopulmonary;  Laterality: N/A;    SOCIAL HISTORY: Social History   Socioeconomic History   Marital status: Married    Spouse name: Not on file   Number of children: Not on file   Years of education: Not on file   Highest education level: Not on file  Occupational History   Not on file  Tobacco Use   Smoking status: Former    Packs/day: 0.50    Years: 50.00    Pack years: 25.00    Types: Cigarettes  Quit date: 04/11/2021    Years since quitting: 0.2   Smokeless tobacco: Never   Tobacco comments:    Quit one month ago  Vaping Use   Vaping Use: Never used  Substance and Sexual Activity   Alcohol use: Never    Drug use: Never   Sexual activity: Not on file  Other Topics Concern   Not on file  Social History Narrative   Lives in [place] with husband; 2 grown sons; quit smoking- June 2022. No alcohol.    Social Determinants of Health   Financial Resource Strain: Not on file  Food Insecurity: Not on file  Transportation Needs: Not on file  Physical Activity: Not on file  Stress: Not on file  Social Connections: Not on file  Intimate Partner Violence: Not on file    FAMILY HISTORY: Family History  Problem Relation Age of Onset   Cancer Sister     ALLERGIES:  has No Known Allergies.  MEDICATIONS:  Current Outpatient Medications  Medication Sig Dispense Refill   albuterol (VENTOLIN HFA) 108 (90 Base) MCG/ACT inhaler Inhale 1-2 puffs into the lungs every 6 (six) hours as needed for wheezing or shortness of breath. 8 g 1   apixaban (ELIQUIS) 5 MG TABS tablet Take 1 tablet (5 mg total) by mouth 2 (two) times daily. 60 tablet 1   Budeson-Glycopyrrol-Formoterol (BREZTRI AEROSPHERE) 160-9-4.8 MCG/ACT AERO Inhale 2 puffs into the lungs in the morning and at bedtime. 5.9 g 0   dextromethorphan-guaiFENesin (MUCINEX DM) 30-600 MG 12hr tablet Take 1 tablet by mouth 2 (two) times daily.     fluconazole (DIFLUCAN) 100 MG tablet Take 1 tablet (100 mg total) by mouth daily. 7 tablet 0   lidocaine-prilocaine (EMLA) cream Apply 1 application topically as needed. 30 g 1   loperamide (IMODIUM A-D) 2 MG tablet Take 2 mg by mouth as needed for diarrhea or loose stools.     potassium chloride SA (KLOR-CON) 20 MEQ tablet 1 pill twice a day 30 tablet 3   No current facility-administered medications for this visit.      Marland Kitchen  PHYSICAL EXAMINATION: ECOG PERFORMANCE STATUS: 1 - Symptomatic but completely ambulatory  Vitals:   07/17/21 0953  BP: 95/72  Pulse: (!) 112   There were no vitals filed for this visit.   Physical Exam Vitals and nursing note reviewed.  Constitutional:      Comments:  Ambulating: In a wheelchair.  Accompanied: with husband.   HENT:     Head: Normocephalic and atraumatic.     Mouth/Throat:     Pharynx: Oropharynx is clear.  Eyes:     Extraocular Movements: Extraocular movements intact.     Pupils: Pupils are equal, round, and reactive to light.  Cardiovascular:     Rate and Rhythm: Normal rate and regular rhythm.  Pulmonary:     Comments: Decreased breath sounds bilaterally.  Abdominal:     Palpations: Abdomen is soft.  Musculoskeletal:        General: Normal range of motion.     Cervical back: Normal range of motion.  Skin:    General: Skin is warm.  Neurological:     General: No focal deficit present.     Mental Status: She is alert and oriented to person, place, and time.  Psychiatric:        Behavior: Behavior normal.        Judgment: Judgment normal.     On 8/16   8/23    LABORATORY DATA:  I have reviewed the data as listed Lab Results  Component Value Date   WBC 6.5 07/17/2021   HGB 9.3 (L) 07/17/2021   HCT 30.1 (L) 07/17/2021   MCV 100.7 (H) 07/17/2021   PLT 666 (H) 07/17/2021   Recent Labs    06/05/21 0946 06/12/21 1325 06/26/21 0901 07/03/21 0803 07/10/21 0824 07/17/21 0935  NA  --    < > 137 142 143 139  K  --    < > 2.6* 3.8 3.9 4.2  CL  --    < > 99 108 111 109  CO2  --    < > 27 25 22  19*  GLUCOSE  --    < > 150* 288* 170* 159*  BUN  --    < > 14 17 23 20   CREATININE  --    < > 0.97 1.59* 1.30* 0.99  CALCIUM  --    < > 7.9* 8.7* 8.9 8.9  GFRNONAA  --    < > >60 36* 46* >60  PROT 6.4*   < > 6.7 7.1  --  8.3*  ALBUMIN 3.1*   < > 2.6* 2.7*  --  3.2*  AST 25   < > 23 22  --  22  ALT 38   < > 30 17  --  10  ALKPHOS 57   < > 115 110  --  110  BILITOT 0.5   < > 0.5 0.5  --  0.8  BILIDIR <0.1  --   --   --   --   --   IBILI NOT CALCULATED  --   --   --   --   --    < > = values in this interval not displayed.    RADIOGRAPHIC STUDIES: I have personally reviewed the radiological images as listed and  agreed with the findings in the report. US Venous Img Upper Uni Right(DVT)  Result Date: 06/18/2021 CLINICAL DATA:  Thrombus in right internal jugular vein. Right internal jugular Port-A-Cath. EXAM: RIGHT UPPER EXTREMITY VENOUS DOPPLER ULTRASOUND TECHNIQUE: Gray-scale sonography with graded compression, as well as color Doppler and duplex ultrasound were performed to evaluate the upper extremity deep venous system from the level of the subclavian vein and including the jugular, axillary, basilic, radial, ulnar and upper cephalic vein. Spectral Doppler was utilized to evaluate flow at rest and with distal augmentation maneuvers. COMPARISON:  CT 06/17/2021 FINDINGS: Contralateral Subclavian Vein: No evidence of thrombus. Normal color Doppler flow and phasicity. Internal Jugular Vein: Positive for thrombus. Echogenic nonocclusive thrombus in the right internal jugular vein compatible with recent CT findings. Subclavian Vein: Positive for thrombus. Evidence for nonocclusive thrombus in the proximal right subclavian region. Distal right subclavian vein has normal compressibility and color Doppler flow and augmentation. Axillary Vein: No evidence of thrombus. Normal compressibility and color Doppler flow. Cephalic Vein: Positive for thrombus. Small amount echogenic thrombus and incomplete compressibility of the distal cephalic vein in the right upper arm. Basilic Vein: Negative for thrombus. Normal compressibility and color Doppler flow. Brachial Veins: Negative for thrombus. Normal compressibility and color Doppler flow. Radial Veins: Negative for thrombus. Normal compressibility and color Doppler flow. Ulnar Veins: Negative for thrombus. Normal compressibility and color Doppler flow. Other Findings:  None visualized. IMPRESSION: 1. Positive for deep venous thrombosis involving the right internal jugular vein. This thrombus is nonocclusive. In addition, there is nonocclusive thrombus in the proximal right subclavian  vein probably near the right innominate vein junction.  2. Positive for superficial venous thrombosis in the right cephalic vein. Right cephalic vein thrombus is nonocclusive. Electronically Signed   By: Markus Daft M.D.   On: 06/18/2021 09:44   DG Fluoro Guide CV Line Right  Result Date: 06/21/2021 CLINICAL DATA:  Small-cell lung carcinoma, status post port catheter placement 06/06/2021. Right IJ nonocclusive thrombus recently noted on ultrasound. Recent inability to aspirate from the port. EXAM: PORT  CATHETER INJECTION UNDER FLUOROSCOPY TECHNIQUE: The procedure, risks (including but not limited to bleeding, infection, organ damage ), benefits, and alternatives were explained to the patient. Questions regarding the procedure were encouraged and answered. The patient understands and consents to the procedure. Survey fluoroscopic inspection reveals stable position of the tunneled power injectable right IJ port catheter, tip at the cavoatrial junction. Note: Blood could be easily aspirated from the port at the beginning and conclusion of the procedure. Injection demonstrates patency of reservoir and catheter tubing. Contrast flows freely into the right atrium. No evidence of fibrin sheath or thrombus. IMPRESSION: 1. Normal port injection under fluoroscopy. Okay for routine use. If difficulty with aspiration recurs, consider CathFlow dwell per protocol. Electronically Signed   By: Lucrezia Europe M.D.   On: 06/21/2021 15:50    ASSESSMENT & PLAN:   Primary cancer of right lower lobe of lung ([place]) #Stage IV/extensive stage small cell lung cancer-with metastasis to the right hilum/supraclavicular/subpectoral/axillary adenopathy; adrenal metastases and pelvic adenopathy.  June 2022 brain MRI-metastasis.  Currently s/p cycle #2 of carbo-etop-Tecentriq-complicated by neutropenic sepsis [see below]- STABLE.   #Hold cycle #3 chemoimmunotherapy because of recent sepsis/declining performance status.  We will repeat scans in  3 weeks.  If no significant improvement in the performance status-we will consider immunotherapy chemotherapy alone.  Add Fulphila.  #Neutropenic sepsis/orbital cellulitis/Pseudomonas bacteremia-currently on ciprofloxacin [as per ID- 14 days]. Resolved.  Will consider dose reduction in subsequent cycles of chemotherapy; good factor support.  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 ]. Steroids off.   # Renal- hypokalemia- improved- STOP [K-4.2]; but creatinine-1.54/acute renal failure: improved creat 0.9.  [pre-renal]  # COPD [quit smoking] continue albuterol inhaler monitor for now; refilled albutreol  #Right IJ DVT-on Eliquis- STABLE.  x3 mnths  # weight loss/debility [walker]- multifactorial recommend PT at home.   # DISPOSITION: de-accessc # referral to PT re: debility/gait instability # No IVFS or K today # 3 week-MD lab- cbc/bmp; IVFs 1 hour/possible KCL 20 meq; possible chemo d-1- carbo-eto;Atezo;  d-2 &3- etop; d-4 fulphila-- CT CAP; MRI brain prior-Dr.B  All questions were answered. The patient knows to call the clinic with any problems, questions or concerns.    Cammie Sickle, MD 07/17/2021 1:02 PM

## 2021-07-26 ENCOUNTER — Encounter: Payer: Self-pay | Admitting: Emergency Medicine

## 2021-07-26 ENCOUNTER — Emergency Department: Payer: 59

## 2021-07-26 ENCOUNTER — Ambulatory Visit: Payer: 59

## 2021-07-26 ENCOUNTER — Other Ambulatory Visit: Payer: Self-pay

## 2021-07-26 ENCOUNTER — Inpatient Hospital Stay
Admission: EM | Admit: 2021-07-26 | Discharge: 2021-07-30 | DRG: 682 | Disposition: A | Discharge status: Home / Self Care | Payer: 59 | Attending: Obstetrics and Gynecology | Admitting: Obstetrics and Gynecology

## 2021-07-26 ENCOUNTER — Inpatient Hospital Stay: Payer: 59 | Attending: Hospice and Palliative Medicine | Admitting: Hospice and Palliative Medicine

## 2021-07-26 ENCOUNTER — Telehealth: Payer: Self-pay | Admitting: *Deleted

## 2021-07-26 ENCOUNTER — Other Ambulatory Visit: Payer: 59

## 2021-07-26 DIAGNOSIS — C3412 Malignant neoplasm of upper lobe, left bronchus or lung: Secondary | ICD-10-CM | POA: Diagnosis not present

## 2021-07-26 DIAGNOSIS — E876 Hypokalemia: Secondary | ICD-10-CM | POA: Diagnosis present

## 2021-07-26 DIAGNOSIS — N179 Acute kidney failure, unspecified: Principal | ICD-10-CM

## 2021-07-26 DIAGNOSIS — Z20822 Contact with and (suspected) exposure to covid-19: Secondary | ICD-10-CM | POA: Diagnosis present

## 2021-07-26 DIAGNOSIS — Z87891 Personal history of nicotine dependence: Secondary | ICD-10-CM

## 2021-07-26 DIAGNOSIS — C7971 Secondary malignant neoplasm of right adrenal gland: Secondary | ICD-10-CM | POA: Diagnosis present

## 2021-07-26 DIAGNOSIS — C7972 Secondary malignant neoplasm of left adrenal gland: Secondary | ICD-10-CM | POA: Diagnosis present

## 2021-07-26 DIAGNOSIS — E43 Unspecified severe protein-calorie malnutrition: Secondary | ICD-10-CM | POA: Diagnosis present

## 2021-07-26 DIAGNOSIS — Z809 Family history of malignant neoplasm, unspecified: Secondary | ICD-10-CM

## 2021-07-26 DIAGNOSIS — R627 Adult failure to thrive: Secondary | ICD-10-CM | POA: Diagnosis present

## 2021-07-26 DIAGNOSIS — C3490 Malignant neoplasm of unspecified part of unspecified bronchus or lung: Secondary | ICD-10-CM

## 2021-07-26 DIAGNOSIS — C7970 Secondary malignant neoplasm of unspecified adrenal gland: Secondary | ICD-10-CM | POA: Diagnosis present

## 2021-07-26 DIAGNOSIS — E861 Hypovolemia: Secondary | ICD-10-CM | POA: Diagnosis present

## 2021-07-26 DIAGNOSIS — Y842 Radiological procedure and radiotherapy as the cause of abnormal reaction of the patient, or of later complication, without mention of misadventure at the time of the procedure: Secondary | ICD-10-CM | POA: Diagnosis not present

## 2021-07-26 DIAGNOSIS — Z681 Body mass index (BMI) 19 or less, adult: Secondary | ICD-10-CM | POA: Diagnosis not present

## 2021-07-26 DIAGNOSIS — R4781 Slurred speech: Secondary | ICD-10-CM | POA: Diagnosis present

## 2021-07-26 DIAGNOSIS — I959 Hypotension, unspecified: Secondary | ICD-10-CM | POA: Diagnosis not present

## 2021-07-26 DIAGNOSIS — E870 Hyperosmolality and hypernatremia: Secondary | ICD-10-CM | POA: Diagnosis present

## 2021-07-26 DIAGNOSIS — R54 Age-related physical debility: Secondary | ICD-10-CM | POA: Diagnosis present

## 2021-07-26 DIAGNOSIS — Z86718 Personal history of other venous thrombosis and embolism: Secondary | ICD-10-CM | POA: Diagnosis not present

## 2021-07-26 DIAGNOSIS — R0602 Shortness of breath: Secondary | ICD-10-CM | POA: Diagnosis present

## 2021-07-26 DIAGNOSIS — C3431 Malignant neoplasm of lower lobe, right bronchus or lung: Secondary | ICD-10-CM | POA: Diagnosis present

## 2021-07-26 DIAGNOSIS — Z515 Encounter for palliative care: Secondary | ICD-10-CM | POA: Diagnosis not present

## 2021-07-26 DIAGNOSIS — B370 Candidal stomatitis: Secondary | ICD-10-CM | POA: Diagnosis present

## 2021-07-26 DIAGNOSIS — J700 Acute pulmonary manifestations due to radiation: Secondary | ICD-10-CM | POA: Diagnosis not present

## 2021-07-26 DIAGNOSIS — D631 Anemia in chronic kidney disease: Secondary | ICD-10-CM | POA: Diagnosis present

## 2021-07-26 DIAGNOSIS — Z79899 Other long term (current) drug therapy: Secondary | ICD-10-CM

## 2021-07-26 DIAGNOSIS — Z7901 Long term (current) use of anticoagulants: Secondary | ICD-10-CM

## 2021-07-26 DIAGNOSIS — E860 Dehydration: Secondary | ICD-10-CM | POA: Diagnosis present

## 2021-07-26 DIAGNOSIS — Z7189 Other specified counseling: Secondary | ICD-10-CM | POA: Diagnosis not present

## 2021-07-26 DIAGNOSIS — Z66 Do not resuscitate: Secondary | ICD-10-CM | POA: Diagnosis not present

## 2021-07-26 DIAGNOSIS — C7931 Secondary malignant neoplasm of brain: Secondary | ICD-10-CM | POA: Diagnosis present

## 2021-07-26 DIAGNOSIS — Z923 Personal history of irradiation: Secondary | ICD-10-CM

## 2021-07-26 DIAGNOSIS — E871 Hypo-osmolality and hyponatremia: Secondary | ICD-10-CM | POA: Diagnosis present

## 2021-07-26 LAB — COMPREHENSIVE METABOLIC PANEL
ALT: 56 U/L — ABNORMAL HIGH (ref 0–44)
AST: 69 U/L — ABNORMAL HIGH (ref 15–41)
Albumin: 3.6 g/dL (ref 3.5–5.0)
Alkaline Phosphatase: 122 U/L (ref 38–126)
Anion gap: 16 — ABNORMAL HIGH (ref 5–15)
BUN: 43 mg/dL — ABNORMAL HIGH (ref 8–23)
CO2: 20 mmol/L — ABNORMAL LOW (ref 22–32)
Calcium: 9.8 mg/dL (ref 8.9–10.3)
Chloride: 112 mmol/L — ABNORMAL HIGH (ref 98–111)
Creatinine, Ser: 1.34 mg/dL — ABNORMAL HIGH (ref 0.44–1.00)
GFR, Estimated: 44 mL/min — ABNORMAL LOW (ref >60)
Glucose, Bld: 149 mg/dL — ABNORMAL HIGH (ref 70–99)
Potassium: 4.1 mmol/L (ref 3.5–5.1)
Sodium: 148 mmol/L — ABNORMAL HIGH (ref 135–145)
Total Bilirubin: 1.4 mg/dL — ABNORMAL HIGH (ref 0.3–1.2)
Total Protein: 8.1 g/dL (ref 6.5–8.1)

## 2021-07-26 LAB — RESP PANEL BY RT-PCR (FLU A&B, COVID) ARPGX2
Influenza A by PCR: NEGATIVE
Influenza B by PCR: NEGATIVE
SARS Coronavirus 2 by RT PCR: NEGATIVE

## 2021-07-26 LAB — PROCALCITONIN: Procalcitonin: 0.26 ng/mL

## 2021-07-26 LAB — CBC
HCT: 34.2 % — ABNORMAL LOW (ref 36.0–46.0)
Hemoglobin: 10.8 g/dL — ABNORMAL LOW (ref 12.0–15.0)
MCH: 33 pg (ref 26.0–34.0)
MCHC: 31.6 g/dL (ref 30.0–36.0)
MCV: 104.6 fL — ABNORMAL HIGH (ref 80.0–100.0)
Platelets: 459 10*3/uL — ABNORMAL HIGH (ref 150–400)
RBC: 3.27 MIL/uL — ABNORMAL LOW (ref 3.87–5.11)
RDW: 18.1 % — ABNORMAL HIGH (ref 11.5–15.5)
WBC: 7.4 10*3/uL (ref 4.0–10.5)
nRBC: 0 % (ref 0.0–0.2)

## 2021-07-26 LAB — T4, FREE: Free T4: 1.05 ng/dL (ref 0.61–1.12)

## 2021-07-26 LAB — LIPASE, BLOOD: Lipase: 52 U/L — ABNORMAL HIGH (ref 11–51)

## 2021-07-26 LAB — TSH: TSH: 12.621 u[IU]/mL — ABNORMAL HIGH (ref 0.350–4.500)

## 2021-07-26 LAB — CK: Total CK: 38 U/L (ref 38–234)

## 2021-07-26 MED ORDER — ENOXAPARIN SODIUM 30 MG/0.3ML IJ SOSY: 30.0000 mg | PREFILLED_SYRINGE | INTRAMUSCULAR | Status: DC

## 2021-07-26 MED ORDER — FLUCONAZOLE 100 MG PO TABS
100.0000 mg | ORAL_TABLET | Freq: Every day | ORAL | Status: DC
  Administered 2021-07-27 – 2021-07-30 (×4): 100 mg via ORAL
  Filled 2021-07-26 (×4): qty 1

## 2021-07-26 MED ORDER — SODIUM CHLORIDE 0.45 % IV SOLN: INTRAVENOUS | Status: DC

## 2021-07-26 MED ORDER — APIXABAN 5 MG PO TABS
5.0000 mg | ORAL_TABLET | Freq: Two times a day (BID) | ORAL | Status: DC
  Administered 2021-07-26 – 2021-07-30 (×8): 5 mg via ORAL
  Filled 2021-07-26 (×2): qty 2
  Filled 2021-07-26 (×6): qty 1

## 2021-07-26 MED ORDER — IBUPROFEN 400 MG PO TABS: 400.0000 mg | ORAL_TABLET | Freq: Four times a day (QID) | ORAL | Status: DC | PRN

## 2021-07-26 MED ORDER — SODIUM CHLORIDE 0.9 % IV BOLUS
1000.0000 mL | Freq: Once | INTRAVENOUS | Status: AC
  Administered 2021-07-26: 1000 mL via INTRAVENOUS

## 2021-07-26 MED ORDER — ENSURE ENLIVE PO LIQD
237.0000 mL | Freq: Two times a day (BID) | ORAL | Status: DC
  Administered 2021-07-26 – 2021-07-27 (×3): 237 mL via ORAL

## 2021-07-26 MED ORDER — ALBUTEROL SULFATE (2.5 MG/3ML) 0.083% IN NEBU
3.0000 mL | INHALATION_SOLUTION | Freq: Four times a day (QID) | RESPIRATORY_TRACT | Status: DC | PRN
  Administered 2021-07-27 (×2): 3 mL via RESPIRATORY_TRACT
  Filled 2021-07-26 (×2): qty 3

## 2021-07-26 NOTE — Progress Notes (Signed)
Virtual Visit via Video Note  I connected with Cathy Glover on 07/26/21 at 10:15 AM EDT by a video enabled telemedicine application and verified that I am speaking with the correct person using two identifiers.  Location: Patient: Home Provider: Clinic   I discussed the limitations of evaluation and management by telemedicine and the availability of in person appointments. The patient expressed understanding and agreed to proceed.  History of Present Illness: Ms. Cathy Glover is a 65 year old woman with multiple medical problems including stage IV extensive stage small cell lung cancer status post chemotherapy/immunotherapy, which was eventually discontinued due to neutropenic sepsis and declining performance status.  Patient has been off chemo over a month.  Patient was hospitalized 06/17/2021-06/21/2021 with neutropenic sepsis.  She last saw Dr. Rogue Bussing on 07/03/2021 with plan for repeat CT scheduled on 08/02/2021.  Patient called in requesting virtual [facility] visit today due to reduced appetite and concern that she could be dehydrated.   Observations/Objective: I spoke with patient/husband.  Patient has had declining performance status and oral intake.  She is mostly in the bed and becomes quite short of breath when she attempts to ambulate a few steps to the bathroom.  Oral intake is minimal.  Husband says last food intake was at least 5 days ago.  She is drinking only a few ounces of water daily.  No fever or chills.  No GI or GU symptoms reported.  Patient is pending repeat CT next week.  I suspect that overall decline may be secondary to disease progression.  We discussed the possibility the patient would not be a candidate for additional cancer treatment.  Husband verbalized agreement with palliative care referral.  Hospice would certainly be an option if patient is felt to have exhausted her treatment options.  However, I would want Dr. Rogue Bussing to be involved in that discussion.     Husband requested labs and IV fluids today.  He plans to bring her to the ER for evaluation.  Assessment and Plan: Extensive stage small cell lung cancer -off treatment due to overall decline and poor tolerance.  Suspect disease progression causing poor oral intake and declining performance status.  Husband plans to bring patient to ER for evaluation.  Would recommend CT of the chest, abdomen, and pelvis as this may help facilitate decision-making.  Referral to palliative care.    I discussed the assessment and treatment plan with the patient. The patient was provided an opportunity to ask questions and all were answered. The patient agreed with the plan and demonstrated an understanding of the instructions.   The patient was advised to call back or seek an in-person evaluation if the symptoms worsen or if the condition fails to improve as anticipated.  I provided 15 minutes of non-face-to-face time during this encounter.   Irean Hong, NP

## 2021-07-26 NOTE — ED Triage Notes (Signed)
Per EMS, Husband of patient called due to not eating, pt has eaten since 09/10 but has been drinking some. Pt was started on chemo the beginning of August and husband states that patient has been slowly declining since. Pt was hospitalized  approximally. 2 weeks ago for sepsis.

## 2021-07-26 NOTE — ED Provider Notes (Signed)
[facility] Emergency Department Provider Note ____________________________________________   Event Date/Time   First MD Initiated Contact with Patient 07/26/21 1145     (approximate)  I have reviewed the triage vital signs and the nursing notes.   HISTORY  Chief Complaint Failure To Thrive    HPI Cathy Glover is a 65 y.o. female currently under care of oncology service, history of hyponatremia hypokalemia recent sepsis with orbital cellulitis that is resolved, and cancer with last treatments reported about a month ago  Her husband reports that since she left the hospital after being admitted for sepsis she is continue to have a slow and gradual decline in increasing weakness uses a walker at baseline but now to the point that he has to sister up to the walker has to walk next to her as she seems to have constant difficulty with weakness fatigue a little bit of lightheadedness and poor balance for at least the last week  [facility] oncology clinic, and after discussion advised to come to the ER for evaluation.  He advises in both the patient as well advises that he just feels weak all over fatigue no appetite has not really had anything solid except for small amount of soup for about 5 to 6 days.  She reports just no appetite.  She is drinking less than about a pint of water daily for the last week as well  Denies pain anywhere no trouble breathing or shortness of breath has a chronic cough that is apparently been off and on lingering for couple months now. Past Medical History:  Diagnosis Date   Cancer [facility])    Pulmonary nodule     Patient Active Problem List   Diagnosis Date Noted   Neutropenic sepsis ([place]) 06/17/2021   Hyponatremia 06/17/2021   Hypokalemia 06/17/2021   Antineoplastic chemotherapy induced pancytopenia ([place]) 06/17/2021   DVT (deep venous thrombosis) ([place]) 06/17/2021   Cellulitis of periorbital region of both eyes 06/17/2021   Goals of  care, counseling/discussion 05/10/2021   Primary cancer of right lower lobe of lung ([place]) 05/01/2021    Past Surgical History:  Procedure Laterality Date   IR IMAGING GUIDED PORT INSERTION  05/11/2021   VIDEO BRONCHOSCOPY WITH ENDOBRONCHIAL ULTRASOUND N/A 04/25/2021   Procedure: VIDEO BRONCHOSCOPY WITH ENDOBRONCHIAL ULTRASOUND;  Surgeon: Tyler Pita, MD;  Location: [facility] ORS;  Service: Cardiopulmonary;  Laterality: N/A;    Prior to Admission medications   Medication Sig Start Date End Date Taking? Authorizing Provider  albuterol (VENTOLIN HFA) 108 (90 Base) MCG/ACT inhaler Inhale 1-2 puffs into the lungs every 6 (six) hours as needed for wheezing or shortness of breath. 07/17/21   Cammie Sickle, MD  apixaban (ELIQUIS) 5 MG TABS tablet Take 1 tablet (5 mg total) by mouth 2 (two) times daily. 07/17/21 08/16/21  Cammie Sickle, MD  Budeson-Glycopyrrol-Formoterol (BREZTRI AEROSPHERE) 160-9-4.8 MCG/ACT AERO Inhale 2 puffs into the lungs in the morning and at bedtime. 05/24/21   Tyler Pita, MD  dextromethorphan-guaiFENesin [facility] DM) 30-600 MG 12hr tablet Take 1 tablet by mouth 2 (two) times daily.    [provider]  fluconazole (DIFLUCAN) 100 MG tablet Take 1 tablet (100 mg total) by mouth daily. 07/09/21   Noreene Filbert, MD  lidocaine-prilocaine (EMLA) cream Apply 1 application topically as needed. 07/17/21   Cammie Sickle, MD  loperamide (IMODIUM A-D) 2 MG tablet Take 2 mg by mouth as needed for diarrhea or loose stools.    [provider]  potassium chloride SA (KLOR-CON) 20 MEQ tablet 1 pill twice a day 06/26/21   Cammie Sickle, MD    Allergies Patient has no known allergies.  Family History  Problem Relation Age of Onset   Cancer Sister     Social History Social History   Tobacco Use   Smoking status: Former    Packs/day: 0.50    Years: 50.00    Pack years: 25.00    Types: Cigarettes    Quit date: 04/11/2021    Years since  quitting: 0.2   Smokeless tobacco: Never   Tobacco comments:    Quit one month ago  Vaping Use   Vaping Use: Never used  Substance Use Topics   Alcohol use: Never   Drug use: Never    Review of Systems Constitutional: No fever/chills but increasing weakness Eyes: No visual changes. ENT: No sore throat. Cardiovascular: Denies chest pain. Respiratory: Denies shortness of breath. Gastrointestinal: No abdominal pain.  Almost no appetite Genitourinary: Negative for dysuria. Musculoskeletal: Negative for back pain.  Had some flank discomfort she reported several days ago but that is since gone away felt constipated Skin: Negative for rash. Neurological: Negative for headaches, areas of focal weakness or numbness.    ____________________________________________   PHYSICAL EXAM:  VITAL SIGNS: ED Triage Vitals  Enc Vitals Group     BP 07/26/21 1156 131/85     Pulse Rate 07/26/21 1156 (!) 116     Resp 07/26/21 1156 18     Temp 07/26/21 1156 97.7 F (36.5 C)     Temp Source 07/26/21 1156 Oral     SpO2 07/26/21 1137 97 %     Weight 07/26/21 1156 86 lb 11.2 oz (39.3 kg)     Height 07/26/21 1156 4\' 11"  (1.499 m)     Head Circumference --      Peak Flow --      Pain Score 07/26/21 1156 0     Pain Loc --      Pain Edu? --      Excl. in [place]? --     Constitutional: Alert and oriented.  Very fatigued appears generally weak but in no acute distress.  Both she and her husband very pleasant Eyes: Conjunctivae are normal. Head: Atraumatic. Nose: No congestion/rhinnorhea. Mouth/Throat: Mucous membranes are notably dry. Neck: No stridor.  Cardiovascular: Normal rate, regular rhythm. Grossly normal heart sounds.  Good peripheral circulation. Respiratory: Normal respiratory effort.  No retractions. Lungs CTAB.  Frequent dry cough which patient husband will report is been an ongoing chronic issue Gastrointestinal: Soft and nontender. No distention. Musculoskeletal: No lower extremity  tenderness nor edema.  Moves all extremities about 4+ strength bilateral.  No noted deficits. Neurologic:  Normal speech and language. No gross focal neurologic deficits are appreciated.  Seems very fatigued Skin:  Skin is warm, dry and intact. No rash noted. Psychiatric: Mood and affect are normal. Speech and behavior are normal.  ____________________________________________   LABS (all labs ordered are listed, but only abnormal results are displayed)  Labs Reviewed  CBC - Abnormal; Notable for the following components:      Result Value   RBC 3.27 (*)    Hemoglobin 10.8 (*)    HCT 34.2 (*)    MCV 104.6 (*)    RDW 18.1 (*)    Platelets 459 (*)    All other components within normal limits  COMPREHENSIVE METABOLIC PANEL - Abnormal; Notable for the following components:   Sodium 148 (*)  Chloride 112 (*)    CO2 20 (*)    Glucose, Bld 149 (*)    BUN 43 (*)    Creatinine, Ser 1.34 (*)    AST 69 (*)    ALT 56 (*)    Total Bilirubin 1.4 (*)    GFR, Estimated 44 (*)    Anion gap 16 (*)    All other components within normal limits  LIPASE, BLOOD - Abnormal; Notable for the following components:   Lipase 52 (*)    All other components within normal limits  RESP PANEL BY RT-PCR (FLU A&B, COVID) ARPGX2  URINALYSIS, COMPLETE (UACMP) WITH MICROSCOPIC  PROCALCITONIN  CK  TSH  T4, FREE   ____________________________________________  EKG  Is reviewed interpreted at 1222 Heart rate 120 QRS 70 QTc 460 Sinus tachycardia, no evidence of acute ischemia or ectopy ____________________________________________  RADIOLOGY  CT HEAD WO CONTRAST (5MM)  Result Date: 07/26/2021 CLINICAL DATA:  Neurological deficit EXAM: CT HEAD WITHOUT CONTRAST TECHNIQUE: Contiguous axial images were obtained from the base of the skull through the vertex without intravenous contrast. COMPARISON:  MRI head dated May 04, 2021 FINDINGS: Brain: Linear area of hypoattenuation involving the temporoparietal  cortex associated calcification, likely treated metastatic disease. No new mass, evidence of acute infarct, hydrocephalus or extra-axial collection. Vascular: No hyperdense vessel or unexpected calcification. Skull: Fluid is seen in the right inner ear and mastoid airspaces. Negative for fracture or focal lesion. Sinuses/Orbits: Air-fluid level of the sphenoid sinus. Other: None. IMPRESSION: No acute intracranial abnormality. Air-fluid level of the sphenoid sinus, findings can be seen in the setting of acute sinusitis Electronically Signed   By: Yetta Glassman M.D.   On: 07/26/2021 14:51   DG Chest Portable 1 View  Result Date: 07/26/2021 CLINICAL DATA:  Weakness, sepsis, metastatic lung cancer EXAM: PORTABLE CHEST 1 VIEW COMPARISON:  06/17/2021, 05/03/2021 FINDINGS: Single frontal view of the chest demonstrates right chest wall port via internal jugular approach tip overlying superior vena cava. The cardiac silhouette is unremarkable. There is increased consolidation within the right middle lobe, now obscuring the right hilar mass seen previously. Nodularity within the lateral right lung base consistent with metastatic disease observed on previous imaging. No effusion or pneumothorax. No acute bony abnormalities. IMPRESSION: 1. Increasing consolidation within the right middle lobe, obscuring the right hilar mass seen previously. This may reflect progression of disease and/or increasing postobstructive change. 2. Nodularity at the right lateral lung base consistent with metastatic disease seen previously. Electronically Signed   By: Randa Ngo M.D.   On: 07/26/2021 15:14     Imaging studies reviewed.  CT of the head negative for acute intracranial abnormality.  Possible finding of sinusitis, however the patient denies facial pain or obvious signs or symptoms support acute sinusitis  Chest x-ray reviewed, personally viewed and there is increased consolidation right middle lobe.  Of clinical note, the  patient does not obviously have acute infectious symptoms.  White count normal, afebrile.  Does have a cough but reports this to be chronic for over a month.  We will send procalcitonin is additional screening though at this point I do not see clear indication of pneumonia.  Suspect likely secondary to patient's known cancer ____________________________________________   PROCEDURES  Procedure(s) performed: None  Procedures  Critical Care performed: No  ____________________________________________   INITIAL IMPRESSION / ASSESSMENT AND PLAN / ED COURSE  Pertinent labs & imaging results that were available during my care of the patient were reviewed by me and  considered in my medical decision making (see chart for details).   Patient presents for weakness.  Increasing weakness for about 1 week.  Decline in patient's general status poor appetite almost no oral intake for several days now.  In the setting of having had recent sepsis and treatment in the hospital, also in the setting of cancer.  No chest pain.  Denies shortness of breath.  Does have a cough but reports it to be quite chronic in nature for over a month.  No obvious infectious symptoms.  Check screening labs, suspect likely dehydration, fluids initiated  Will work-up broadly for etiology including obtaining CT of the head to evaluate for potential progression of metastatic disease rule out intracranial hemorrhage mass-effect etc.  3:20 PM Patient resting, husband at bedside.  Reviewed results, in the context of hypernatremia, elevated creatinine, reduced GFR, discussed with patient and husband and given her lack of appetite, anorexia, dehydration and clinical worsening in the setting of known cancer will admit for further care and management with the hospitalist.  Chest x-ray has been reviewed, procalcitonin ordered.  We will observe for evidence of potential pneumonia but at this point I do not see clear evidence  Admission  discussed with hospitalist Dr. Nevada Crane      ____________________________________________   FINAL CLINICAL IMPRESSION(S) / ED DIAGNOSES  Final diagnoses:  AKI (acute kidney injury) ([place])  Dehydration  Hypernatremia        Note:  This document was prepared using Dragon voice recognition software and may include unintentional dictation errors       Delman Kitten, MD 07/26/21 1540

## 2021-07-26 NOTE — Telephone Encounter (Signed)
Contacted husband. Arranged for [employer] visit with patient/ husband at 13.  RN Spoke with Merrily Pew, NP after the virtual visit- regarding goals of care. Per Josh, pt is bedfast. Pt has not eaten/drank much fluids in over a week. Per Josh-pt is certainly hospice appropriate, but pt still wants to be treated in the near future for chemotherapy. Josh also requested that CT scan be moved up. Per Merrily Pew- husband wanted a call back to discuss the apt times for outpatient fluids in [place]. Josh had encouraged patient to go into the hospital, but patient declined and wanted to manage the patient out pt as much as possible.  I contacted the husband back at 1045. Per Husband, he is uncertain that he can even get the pt to the [facility] cancer center w/o EMS transport. Patient is very weak. Discussed the options for patient going to the ER as patient/consider admission to the hospital. Husband stated that he feels this is best. He will call 911 to see if he can have EMS transport to the ER as soon as possible.

## 2021-07-26 NOTE — ED Notes (Signed)
Care transferred, report received from [place], [place]

## 2021-07-26 NOTE — Telephone Encounter (Signed)
Mr Polito called concerned about patient, she is not eating, has had no food this week and only nibbles last week. She is barely drinking anything either. He does not know what to do and fears she is dehydrated. He is asking for advice on what to do

## 2021-07-26 NOTE — H&P (Signed)
History and Physical  Cathy Glover ION:[id number] DOB: 07/23/1956 DOA: 07/26/2021  Referring physician: Dr. Jacqualine Code, EDP  PCP: Cammie Sickle, MD  Outpatient Specialists: Medical oncology, radiation oncology, Patient coming from: Home.  Chief Complaint: Failure to thrive.  HPI: Cathy Glover is a 65 y.o. 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  She has not eaten for more than a week.  She has had small sips of liquids intake.  Denies odynophagia but sometimes feels like food gets stuck in her throat.  Due to concern for dehydration, her husband contacted her oncologist for assistance. A month ago, her chemotherapy/immunotherapy was discontinued due to neutropenic sepsis and declining performance status.  Patient has been off chemotherapy for over a month.  She was recently admitted for Pseudomonas bacteremia and hospitalized between 06/17/2021 until 06/21/2021 with neutropenic sepsis.  She last saw her oncologist Dr. Lynett Fish on 05/03/2021 with plan to repeat CT scans on 08/02/2021.  She is mostly in the bed and becomes quite short of breath with minimal exertion.  Reports a persistent non productive cough.    Upon presentation to the ED she is weak appearing but alert and oriented.  Her husband is at bedside and provides most of the history.  Work up revealed AKI and hypernatremia.  EDP requested admission due to dehydration, AKI, and hypernatremia.  ED Course: Temp 98.1, 130/91, HR 101, RR 18. O2 sat 97% RA.  Review of Systems: Review of systems as noted in the HPI. All other systems reviewed  and are negative.   Past Medical History:  Diagnosis Date   Cancer [facility])    Pulmonary nodule    Past Surgical History:  Procedure Laterality Date   IR IMAGING GUIDED PORT INSERTION  05/11/2021   VIDEO BRONCHOSCOPY WITH ENDOBRONCHIAL ULTRASOUND N/A 04/25/2021   Procedure: VIDEO BRONCHOSCOPY WITH ENDOBRONCHIAL ULTRASOUND;  Surgeon: Tyler Pita, MD;  Location: [facility] ORS;  Service: Cardiopulmonary;  Laterality: N/A;    Social History:  reports that she quit smoking about 3 months ago. Her smoking use included cigarettes. She has a 25.00 pack-year smoking history. She has never used smokeless tobacco. She reports that she does not drink alcohol and does not use drugs.   No Known Allergies  Family History  Problem Relation Age of Onset   Cancer Sister       Prior to Admission medications   Medication Sig Start Date End Date Taking? Authorizing Provider  albuterol (VENTOLIN HFA) 108 (90 Base) MCG/ACT inhaler Inhale 1-2 puffs into the lungs every 6 (six) hours as needed for wheezing or shortness of breath. 07/17/21   Cammie Sickle, MD  apixaban (ELIQUIS) 5 MG TABS tablet Take 1 tablet (5 mg total) by mouth 2 (two) times daily. 07/17/21 08/16/21  Cammie Sickle, MD  Budeson-Glycopyrrol-Formoterol (BREZTRI AEROSPHERE) 160-9-4.8 MCG/ACT AERO Inhale 2 puffs into the lungs in the morning and at bedtime. 05/24/21   Tyler Pita, MD  dextromethorphan-guaiFENesin [facility] DM) 30-600 MG 12hr tablet Take 1 tablet by mouth 2 (two) times daily.    [provider]  fluconazole (DIFLUCAN) 100 MG tablet Take 1 tablet (100 mg total) by mouth daily. 07/09/21  Noreene Filbert, MD  lidocaine-prilocaine (EMLA) cream Apply 1 application topically as needed. 07/17/21   Cammie Sickle, MD  loperamide (IMODIUM A-D) 2 MG tablet Take 2 mg by mouth as needed for diarrhea or loose stools.    [provider]  potassium chloride SA (KLOR-CON) 20 MEQ tablet 1 pill twice a day  06/26/21   Cammie Sickle, MD    Physical Exam: BP (!) 146/87 (BP Location: Left Arm)   Pulse (!) 109   Temp 97.7 F (36.5 C) (Oral)   Resp 16   Ht 4\' 11"  (1.499 m)   Wt 39.3 kg   SpO2 99%   BMI 17.51 kg/m   General: 65 y.o. year-old female frail-appearing in no acute distress.  Alert and oriented x3. Cardiovascular: Regular rate and rhythm with no rubs or gallops.  No thyromegaly or JVD noted.  No lower extremity edema. 2/4 pulses in all 4 extremities. Respiratory: Clear to auscultation with no wheezes or rales. Good inspiratory effort. Abdomen: Soft nontender nondistended with normal bowel sounds x4 quadrants. Muskuloskeletal: No cyanosis, clubbing or edema noted bilaterally Neuro: CN II-XII intact, strength, sensation, reflexes Skin: No ulcerative lesions noted or rashes Psychiatry: Judgement and insight appear normal. Mood is appropriate for condition and setting          Labs on Admission:  Basic Metabolic Panel: Recent Labs  Lab 07/26/21 1243  NA 148*  K 4.1  CL 112*  CO2 20*  GLUCOSE 149*  BUN 43*  CREATININE 1.34*  CALCIUM 9.8   Liver Function Tests: Recent Labs  Lab 07/26/21 1243  AST 69*  ALT 56*  ALKPHOS 122  BILITOT 1.4*  PROT 8.1  ALBUMIN 3.6   Recent Labs  Lab 07/26/21 1243  LIPASE 52*   No results for input(s): AMMONIA in the last 168 hours. CBC: Recent Labs  Lab 07/26/21 1243  WBC 7.4  HGB 10.8*  HCT 34.2*  MCV 104.6*  PLT 459*   Cardiac Enzymes: Recent Labs  Lab 07/26/21 1243  CKTOTAL 38    BNP (last 3 results) No results for input(s): BNP in the last 8760 hours.  ProBNP (last 3 results) No results for input(s): PROBNP in the last 8760 hours.  CBG: No results for input(s): GLUCAP in the last 168 hours.  Radiological Exams on Admission: CT HEAD WO CONTRAST (5MM)  Result Date: 07/26/2021 CLINICAL DATA:  Neurological deficit EXAM: CT HEAD WITHOUT CONTRAST TECHNIQUE: Contiguous axial images were obtained from the  base of the skull through the vertex without intravenous contrast. COMPARISON:  MRI head dated May 04, 2021 FINDINGS: Brain: Linear area of hypoattenuation involving the temporoparietal cortex associated calcification, likely treated metastatic disease. No new mass, evidence of acute infarct, hydrocephalus or extra-axial collection. Vascular: No hyperdense vessel or unexpected calcification. Skull: Fluid is seen in the right inner ear and mastoid airspaces. Negative for fracture or focal lesion. Sinuses/Orbits: Air-fluid level of the sphenoid sinus. Other: None. IMPRESSION: No acute intracranial abnormality. Air-fluid level of the sphenoid sinus, findings can be seen in the setting of acute sinusitis Electronically Signed   By: Yetta Glassman M.D.   On: 07/26/2021 14:51   DG Chest Portable 1 View  Result Date: 07/26/2021 CLINICAL DATA:  Weakness, sepsis, metastatic lung cancer EXAM: PORTABLE CHEST 1 VIEW COMPARISON:  06/17/2021, 05/03/2021 FINDINGS: Single frontal view of the chest demonstrates right chest wall port via internal jugular approach tip overlying superior vena cava. The cardiac silhouette is unremarkable. There is increased consolidation within  the right middle lobe, now obscuring the right hilar mass seen previously. Nodularity within the lateral right lung base consistent with metastatic disease observed on previous imaging. No effusion or pneumothorax. No acute bony abnormalities. IMPRESSION: 1. Increasing consolidation within the right middle lobe, obscuring the right hilar mass seen previously. This may reflect progression of disease and/or increasing postobstructive change. 2. Nodularity at the right lateral lung base consistent with metastatic disease seen previously. Electronically Signed   By: Randa Ngo M.D.   On: 07/26/2021 15:14    EKG: I independently viewed the EKG done and my findings are as followed: Sinus tachycardia rate of 118, nonspecific ST changes.  QTc  443.  Assessment/Plan Present on Admission:  AKI (acute kidney injury) ([place])  Active Problems:   AKI (acute kidney injury) ([place])  AKI, likely prerenal in the setting of poor oral intake and dehydration. Baseline creatinine 0.99 Presented with creatinine of 1.34, hypovolemic on exam. Continue IV fluid hydration Continue to encourage oral intake. Avoid nephrotoxic agents, dehydration and hypotension.  Hypovolemic hyponatremia Presented with serum sodium of 148 Half-normal saline at 50 cc/h x 2 days. Repeat BMP in the morning.  Failure to thrive in an adult Encourage oral intake Start nutritional supplement Dietary consult.  Possible dysphagia Persistent cough Feeling of food getting stuck in the esophagus Speech therapy assessment Aspiration precaution  Anemia of chronic disease Monitor  Stage IV small cell lung cancer Follows with oncology Consult oncology to assist with establishing goals of care  Goals of care Critical care consulted to assist with establishing goals of care Currently she is full code.  DVT prophylaxis: Eliquis  Code Status: Full code  Family Communication: Husband at bedside  Disposition Plan: Admitted to [place] unit with remote telemetry  Consults called: Palliative care team  Admission status: Inpatient status.   Status is: Inpatient    Dispo: The patient is from: Home              Anticipated d/c is to: Home              Patient currently not medically stable.   Difficult to place patient not applicable.       Kayleen Memos MD [facility] Pager [phone]  If 7PM-7AM, please contact night-coverage [email or web address] Password [facility]  07/26/2021, 4:12 PM

## 2021-07-26 NOTE — Progress Notes (Signed)
PHARMACIST - PHYSICIAN COMMUNICATION  CONCERNING:  Enoxaparin (Lovenox) for DVT Prophylaxis    RECOMMENDATION: Patient was prescribed enoxaparin 40 mg q24 hours for VTE prophylaxis.   Filed Weights   07/26/21 1156  Weight: 39.3 kg (86 lb 11.2 oz)    Body mass index is 17.51 kg/m.  Estimated Creatinine Clearance: 25.6 mL/min (A) (by C-G formula based on SCr of 1.34 mg/dL (H)).   Based on [place] patient is candidate for enoxaparin 30 mg every 24 hours based on CrCl < 19ml/min AND weight < 45 kg  DESCRIPTION: Pharmacy has adjusted enoxaparin dose per [facility] policy.  Patient is now receiving enoxaparin 30 mg every 24 hours   Tawnya Crook, PharmD, BCPS Clinical Pharmacist 07/26/2021 3:47 PM

## 2021-07-26 NOTE — Progress Notes (Signed)
[place] ED[facility] [employer] [facility]) Hospital Liaison note:  This is a pending outpatient-based Palliative Care patient. Will continue to follow for disposition.  Please call with any outpatient palliative questions or concerns.  Thank you, Lorelee Market, LPN [facility] Liaison [phone]

## 2021-07-27 ENCOUNTER — Other Ambulatory Visit: Payer: Self-pay

## 2021-07-27 ENCOUNTER — Inpatient Hospital Stay: Payer: 59

## 2021-07-27 DIAGNOSIS — Z7189 Other specified counseling: Secondary | ICD-10-CM

## 2021-07-27 DIAGNOSIS — Z515 Encounter for palliative care: Secondary | ICD-10-CM

## 2021-07-27 DIAGNOSIS — E860 Dehydration: Secondary | ICD-10-CM | POA: Diagnosis not present

## 2021-07-27 DIAGNOSIS — R627 Adult failure to thrive: Secondary | ICD-10-CM

## 2021-07-27 DIAGNOSIS — Z86718 Personal history of other venous thrombosis and embolism: Secondary | ICD-10-CM

## 2021-07-27 DIAGNOSIS — N179 Acute kidney failure, unspecified: Principal | ICD-10-CM

## 2021-07-27 DIAGNOSIS — C3431 Malignant neoplasm of lower lobe, right bronchus or lung: Secondary | ICD-10-CM | POA: Diagnosis not present

## 2021-07-27 DIAGNOSIS — E43 Unspecified severe protein-calorie malnutrition: Secondary | ICD-10-CM | POA: Insufficient documentation

## 2021-07-27 LAB — COMPREHENSIVE METABOLIC PANEL
ALT: 50 U/L — ABNORMAL HIGH (ref 0–44)
AST: 53 U/L — ABNORMAL HIGH (ref 15–41)
Albumin: 2.9 g/dL — ABNORMAL LOW (ref 3.5–5.0)
Alkaline Phosphatase: 100 U/L (ref 38–126)
Anion gap: 6 (ref 5–15)
BUN: 35 mg/dL — ABNORMAL HIGH (ref 8–23)
CO2: 21 mmol/L — ABNORMAL LOW (ref 22–32)
Calcium: 8.6 mg/dL — ABNORMAL LOW (ref 8.9–10.3)
Chloride: 117 mmol/L — ABNORMAL HIGH (ref 98–111)
Creatinine, Ser: 0.92 mg/dL (ref 0.44–1.00)
GFR, Estimated: >60 mL/min (ref >60)
Glucose, Bld: 109 mg/dL — ABNORMAL HIGH (ref 70–99)
Potassium: 3.6 mmol/L (ref 3.5–5.1)
Sodium: 144 mmol/L (ref 135–145)
Total Bilirubin: 1 mg/dL (ref 0.3–1.2)
Total Protein: 6.4 g/dL — ABNORMAL LOW (ref 6.5–8.1)

## 2021-07-27 LAB — CBC
HCT: 26.4 % — ABNORMAL LOW (ref 36.0–46.0)
Hemoglobin: 8.3 g/dL — ABNORMAL LOW (ref 12.0–15.0)
MCH: 33.3 pg (ref 26.0–34.0)
MCHC: 31.4 g/dL (ref 30.0–36.0)
MCV: 106 fL — ABNORMAL HIGH (ref 80.0–100.0)
Platelets: 329 10*3/uL (ref 150–400)
RBC: 2.49 MIL/uL — ABNORMAL LOW (ref 3.87–5.11)
RDW: 17.9 % — ABNORMAL HIGH (ref 11.5–15.5)
WBC: 5 10*3/uL (ref 4.0–10.5)
nRBC: 0 % (ref 0.0–0.2)

## 2021-07-27 LAB — MAGNESIUM: Magnesium: 2.4 mg/dL (ref 1.7–2.4)

## 2021-07-27 LAB — PHOSPHORUS: Phosphorus: 3.1 mg/dL (ref 2.5–4.6)

## 2021-07-27 MED ORDER — PROSOURCE PLUS PO LIQD
30.0000 mL | Freq: Two times a day (BID) | ORAL | Status: DC
  Administered 2021-07-27 – 2021-07-30 (×2): 30 mL via ORAL
  Filled 2021-07-27 (×7): qty 30

## 2021-07-27 MED ORDER — ADULT MULTIVITAMIN W/MINERALS CH
1.0000 | ORAL_TABLET | Freq: Every day | ORAL | Status: DC
  Administered 2021-07-27 – 2021-07-30 (×3): 1 via ORAL
  Filled 2021-07-27 (×4): qty 1

## 2021-07-27 MED ORDER — IOHEXOL 350 MG/ML SOLN
75.0000 mL | Freq: Once | INTRAVENOUS | Status: AC | PRN
  Administered 2021-07-27: 17:00:00 75 mL via INTRAVENOUS

## 2021-07-27 MED ORDER — ENSURE ENLIVE PO LIQD
237.0000 mL | Freq: Three times a day (TID) | ORAL | Status: DC
  Administered 2021-07-29 – 2021-07-30 (×2): 237 mL via ORAL

## 2021-07-27 MED ORDER — DEXTROSE-NACL 5-0.45 % IV SOLN: INTRAVENOUS | Status: DC

## 2021-07-27 MED ORDER — CHLORHEXIDINE GLUCONATE CLOTH 2 % EX PADS
6.0000 | MEDICATED_PAD | Freq: Every day | CUTANEOUS | Status: DC
  Administered 2021-07-27 – 2021-07-30 (×4): 6 via TOPICAL

## 2021-07-27 NOTE — Evaluation (Signed)
Occupational Therapy Evaluation Patient Details Name: Cathy Glover MRN: [id number] DOB: Dec 25, 1955 Today's Date: 07/27/2021   History of Present Illness 65 y.o. 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  A month ago, her chemotherapy/immunotherapy was discontinued due to neutropenic sepsis and declining performance status.  Patient has been off chemotherapy for over a month.  She was recently admitted for Pseudomonas bacteremia and hospitalized between 06/17/2021 until 06/21/2021 with neutropenic sepsis   Clinical Impression   Patient presenting with decreased I in self care,balance, functional mobility/transfer, endurance, and safety awarenes. Patient's husband present to confirm baseline. Pt lives with husband and needs assistance from family for balance with mobility and self care tasks. She has progressively become weaker over several month with illness. Husband reports several LOB's and pt being lowered to floor with B knee buckling.  Patient rolling L <> R with min A for to change chux pad and pt performing hygiene with set up A. Supine >sit with min A to EOB. Pt needing min - mod A for sitting balance. Pt standing with min - mod A to pull brief over hips. Pt taking several side steps along bed with mod A. Pt very fatigued and returns to bed at end of session. OT discussed 3 in 1 and wheelchair for home to conserve energy and increase safety awareness Patient will benefit from acute OT to increase overall independence in the areas of ADLs, functional mobility, and safety awareness  in order to safely discharge home with family.      Recommendations for follow up therapy are one component of a multi-disciplinary discharge planning process, led by the attending physician.  Recommendations may be updated based on patient status, additional functional criteria and insurance authorization.   Follow Up Recommendations  Home health OT;Supervision/Assistance - 24 hour    Equipment Recommendations  3 in 1 bedside commode;Wheelchair (measurements OT);Wheelchair cushion (measurements OT)       Precautions / Restrictions Precautions Precautions: Fall Restrictions Weight Bearing Restrictions: No      Mobility Bed Mobility Overal bed mobility: Needs Assistance Bed Mobility: Supine to Sit;Sit to Supine     Supine to sit: Min assist Sit to supine: Min assist   General bed mobility comments: min A for trunk support and min cuing for hand placement and technique    Transfers Overall transfer level: Needs assistance Equipment used: 1 person hand held assist Transfers: Sit to/from [facility] Sit to Stand: Min assist Stand pivot transfers: Min assist;Mod assist            Balance Overall balance assessment: Needs assistance Sitting-balance support: Feet supported;Single extremity supported Sitting balance-Leahy Scale: Poor Sitting balance - Comments: R lateral lean with min - mod A to correct   Standing balance support: During functional activity Standing balance-Leahy Scale: Poor                             ADL either performed or assessed with clinical judgement   ADL Overall ADL's : Needs assistance/impaired             Lower Body Bathing: Moderate assistance;Sit to/from stand;Sitting/lateral leans  General ADL Comments: Pt needing min - mod A for sitting balance on EOB when utilizing figure four position to don socks. She fatigues quickly. Pt standing with min A for balance and pulls brief over B hips with much effort.     Vision  Patient Visual Report: No change from baseline              Pertinent Vitals/Pain Pain Assessment: No/denies pain     Hand Dominance Right   Extremity/Trunk Assessment Upper Extremity Assessment Upper Extremity Assessment: Generalized weakness   Lower Extremity Assessment Lower Extremity Assessment: Generalized weakness       Communication Communication Communication: No difficulties   Cognition Arousal/Alertness: Awake/alert Behavior During Therapy: Flat affect Overall Cognitive Status: Impaired/Different from baseline Area of Impairment: Safety/judgement;Awareness;Following commands;Memory;Attention;Orientation                 Orientation Level: Disoriented to;Time     Following Commands: Follows one step commands with increased time;Follows one step commands inconsistently Safety/Judgement: Decreased awareness of safety;Decreased awareness of deficits Awareness: Emergent   General Comments: Husband present and reports pt is very confused.              Home Living Family/patient expects to be discharged to:: Private residence Living Arrangements: Spouse/significant other Available Help at Discharge: Family;Available 24 hours/day Type of Home: House Home Access: Stairs to enter [employer] of Steps: 3-4 Entrance Stairs-Rails: None Home Layout: One level     Bathroom Shower/Tub: Tub/shower unit;Walk-in shower         Home Equipment: Latina Craver - 2 wheels;Shower seat          Prior Functioning/Environment Level of Independence: Needs assistance  Gait / Transfers Assistance Needed: Pt ambulates with RW and assistance from husband. She has had several occasions of L knee buckling where he has caught her or lowered her to floor. ADL's / Homemaking Assistance Needed: Use of shower chair for bathing and assist as needed from spouse   Comments: Pt was working until July and has been progressively getting weaker, started chemo, and  has not been eating well at home.        OT Problem List: Decreased strength;Decreased knowledge of use of DME or AE;Decreased activity tolerance;Impaired balance (sitting and/or standing);Decreased safety awareness;Decreased cognition      OT Treatment/Interventions: Self-care/ADL training;Manual therapy;Therapeutic exercise;Modalities;Patient/family education;Balance training;Energy conservation;Therapeutic activities;DME and/or AE instruction;Cognitive remediation/compensation    OT Goals(Current goals can be found in the care plan section) Acute Rehab OT Goals Patient Stated Goal: to return home OT Goal Formulation: With patient/family Time For Goal Achievement: 08/10/21 Potential to Achieve Goals: Good ADL Goals Pt Will Perform Grooming: (P) with min assist Pt Will Perform Lower Body Dressing: (P) with min assist Pt Will Transfer to Toilet: (P) with min assist Pt Will Perform Toileting - Clothing Manipulation and hygiene: (P) with min assist  OT Frequency: Min 2X/week   Barriers to D/C:    none known at this time          AM-PAC OT "6 Clicks" Daily Activity     Outcome Measure Help from another person eating meals?: A Little Help from another person taking care of personal grooming?: A Little Help from another person toileting, which includes using toliet, bedpan, or urinal?: A Lot Help from another person bathing (including washing, rinsing, drying)?: A Lot Help from another person to put on and taking off regular upper body clothing?: A Little Help from another person to put on and  taking off regular lower body clothing?: A Lot 6 Click Score: 15   End of Session Nurse Communication: Mobility status  Activity Tolerance: Patient limited by fatigue Patient left: in bed;with call bell/phone within reach;with bed alarm set;with family/visitor present  OT Visit Diagnosis: Unsteadiness on feet (R26.81);Repeated falls (R29.6);Muscle weakness (generalized) (M62.81)                 Time: 5400-8676 OT Time Calculation (min): 39 min Charges:  OT General Charges $OT Visit: 1 Visit OT Evaluation $OT Eval Moderate Complexity: 1 Mod OT Treatments $Self Care/Home Management : 23-37 mins  Darleen Crocker, MS, OTR/L , CBIS ascom [phone]  07/27/21, 12:41 PM

## 2021-07-27 NOTE — Evaluation (Signed)
Clinical/Bedside Swallow Evaluation Patient Details  Name: Cathy Glover MRN: [id number] Date of Birth: 1956/11/03  Today's Date: 07/27/2021 Time: SLP Start Time (ACUTE ONLY): 1055 SLP Stop Time (ACUTE ONLY): 1145 SLP Time Calculation (min) (ACUTE ONLY): 50 min  Past Medical History:  Past Medical History:  Diagnosis Date   Cancer ([place])    Pulmonary nodule    Past Surgical History:  Past Surgical History:  Procedure Laterality Date   IR IMAGING GUIDED PORT INSERTION  05/11/2021   VIDEO BRONCHOSCOPY WITH ENDOBRONCHIAL ULTRASOUND N/A 04/25/2021   Procedure: VIDEO BRONCHOSCOPY WITH ENDOBRONCHIAL ULTRASOUND;  Surgeon: Tyler Pita, MD;  Location: [facility] ORS;  Service: Cardiopulmonary;  Laterality: N/A;   HPI:  Cathy Glover is a 65 y.o. 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  She has not eaten for more than a week.  She has had small sips of liquids intake.  Denies odynophagia but sometimes feels like food gets stuck in her throat.  Due to concern for dehydration, her husband contacted her oncologist for assistance. A month ago, her chemotherapy/immunotherapy was discontinued due to neutropenic sepsis and declining performance status.  Patient has been off chemotherapy for over a month.  She was recently admitted for Pseudomonas bacteremia and hospitalized between 06/17/2021 until 06/21/2021 with neutropenic sepsis.  She last saw her oncologist Dr. Lynett Fish on 05/03/2021 with plan to repeat CT scans on 08/02/2021.  She is mostly in the bed and becomes quite short of breath with minimal exertion;  cough.   CXR: Increasing consolidation within the right middle lobe, obscuring  the right hilar mass seen previously. This may reflect progression  of disease and/or increasing postobstructive change.  2. Nodularity at the right lateral lung base consistent with  metastatic disease seen previously.    Assessment / Plan / Recommendation  Clinical Impression  Pt appears to present w/ grossly adequate oropharyngeal phase swallow function w/ No overt oropharyngeal phase dysphagia noted, No gross neuromuscular deficits noted. Pt exhibits generalized weakness overall d/t chronic illness/txs recently. Pt consumed po trials w/ No overt, clinical s/s of aspiration during po trials. Pt appears at reduced risk for aspiration following general aspiration precautions.   OF NOTE, pt c/o a "globus" feeling pointing to her sternum area, stating solid foods can feel "stuck" here causing discomfort even when "trying to drink something afterward". Pt may be experiencing s/s of Esophageal Dysmotility -- any such dysmotility can increase risk for Retrograde activity and overall satiety thus impacting oral intake. It can increase risk for REFLUX activity and chance for aspiration of REFLUX. Per noting and MD f/u, dark plaques seen on tongue which was felt likely related to her advanced cancer, but reasonable to treat for candida - continue home fluconazole. Address strict oral care of Dentures b/c of this.    During po trials, pt consumed all consistencies w/ no overt coughing, decline in vocal quality, or change in respiratory presentation during/post trials. Oral phase appeared grossly [facility] w/ timely bolus management, mastication, and control of bolus propulsion for A-P transfer for swallowing. Oral clearing achieved w/ all trial consistencies. OM Exam appeared [facility] w/ no unilateral weakness noted. Speech Clear; low volume. Pt  fed self w/ setup support.   Recommend continue a Regular consistency diet(for options) w/ well-Cut  meats, moistened foods; Thin liquids. Recommend general aspiration precautions, Pills WHOLE in Puree for safer, easier swallowing as pt described Larger pills causing difficulty to swallow. Education given on Pills in Puree; food consistencies and easy to eat options; general aspiration precautions to pt and Husband, NSG. NSG to reconsult if any new needs arise. NSG agreed. Recommend f/u w/ Dietician; noted Palliative Care following. SLP Visit Diagnosis: Dysphagia, unspecified (R13.10) (Esophageal phase dysmotility reported)    Aspiration Risk  Mild aspiration risk;Risk for inadequate nutrition/hydration (reduced following precautions)    Diet Recommendation   Regular consistency diet(for options) w/ well-Cut meats, moistened foods; Thin liquids. Recommend general aspiration precautions. Monitor successful food consistencies; moisten well, chop small.   Medication Administration: Whole meds with puree (for easier swallowing)    Other  Recommendations Recommended Consults: Consider GI evaluation (as warranted; Dietician f/u; Palliative Care following) Oral Care Recommendations: Oral care BID;Oral care before and after PO;Staff/trained caregiver to provide oral care (Denture care) Other Recommendations:  (n/a)    Recommendations for follow up therapy are one component of a multi-disciplinary discharge planning process, led by the attending physician.  Recommendations may be updated based on patient status, additional functional criteria and insurance authorization.  Follow up Recommendations None      Frequency and Duration  (n/a)   (n/a)       Prognosis Prognosis for Safe Diet Advancement: Fair Barriers to Reach Goals: Time post onset;Severity of deficits Barriers/Prognosis Comment: baseline Cancer, txs      Swallow Study   General Date of Onset: 07/26/21 HPI: Cathy Glover is a 65 y.o. female with medical history significant for right lower lobe stage IV small cell lung cancer with  metastasis to the right helium/supraclavicular/pectoral/axillary adenopathy, adrenal metastasis and pelvic adenopathy, brain MRI metastasis status post radiation, COPD, right IJ DVT on Eliquis, weight loss, failure to thrive, debility/gait instability using a walker at baseline, who presented to [facility] ED due to failure to thrive and poor oral intake.  She has not eaten for more than a week.  She has had small sips of liquids intake.  Denies odynophagia but sometimes feels like food gets stuck in her throat.  Due to concern for dehydration, her husband contacted her oncologist for assistance. A month ago, her chemotherapy/immunotherapy was discontinued due to neutropenic sepsis and declining performance status.  Patient has been off chemotherapy for over a month.  She was recently admitted for Pseudomonas bacteremia and hospitalized between 06/17/2021 until 06/21/2021 with neutropenic sepsis.  She last saw her oncologist Dr. Lynett Fish on 05/03/2021 with plan to repeat CT scans on 08/02/2021.  She is mostly in the bed and becomes quite short of breath with minimal exertion; cough.   CXR: Increasing consolidation within the right middle lobe, obscuring  the right hilar mass seen previously. This may reflect progression  of disease and/or increasing postobstructive change.  2. Nodularity at the right lateral lung base consistent with  metastatic disease seen previously. Type of Study: Bedside Swallow Evaluation Previous Swallow Assessment: none Diet Prior to this Study: Regular;Thin liquids Temperature Spikes Noted: No (wbc 5.0) Respiratory Status: Room air History of Recent Intubation: No Behavior/Cognition: Alert;Cooperative;Pleasant mood Oral Cavity Assessment:  (dark places on tongue - MD aware) Oral Care Completed by SLP: Yes Oral Cavity - Dentition: Dentures, top;Dentures, bottom Vision: Functional for self-feeding Self-Feeding Abilities: Able to feed self;Needs assist;Needs set up (general weakness) Patient  Positioning: Upright in bed (needed positioning support for a more forward position) Baseline Vocal Quality: Low vocal intensity Volitional Cough: Strong Volitional Swallow: Able to elicit    Oral/Motor/Sensory Function Overall Oral Motor/Sensory Function: Within functional limits (grossly -- general weak)   Ice Chips Ice chips: Within functional limits Presentation: Spoon (fed; 2 trials)   Thin Liquid Thin Liquid: Within functional limits Presentation: Self Fed;Straw (6 trials)    Nectar Thick Nectar Thick Liquid: Not tested   Honey Thick Honey Thick Liquid: Not tested   Puree Puree: Within functional limits Presentation: Spoon;Self Fed (6 trials)   Solid     Solid: Within functional limits (grossly) Presentation: Self Fed (4 trials) Other Comments: preferred purees to eat and ice cream         Orinda Kenner, MS, [employer] Speech Language Pathologist Rehab Services [phone] [facility] 07/27/2021,2:32 PM

## 2021-07-27 NOTE — Consult Note (Signed)
Consultation Note Date: 07/27/2021   Patient Name: Cathy Glover  DOB: [birth year]  MRN: [id number]  Age / Sex: 65 y.o., female  PCP: Cammie Sickle, MD Referring Physician: Gwynne Edinger, MD  Reason for Consultation: Establishing goals of care  HPI/Patient Profile: 65 y.o. 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  A month ago, her chemotherapy/immunotherapy was discontinued due to neutropenic sepsis and declining performance status.  Patient has been off chemotherapy for over a month.  She was recently admitted for Pseudomonas bacteremia and hospitalized between 06/17/2021 until 06/21/2021 with neutropenic sepsis. PMT consulted to discuss [place].  Clinical Assessment and Goals of Care: I have reviewed medical records including EPIC notes, labs and imaging, assessed the patient and then met with patient and spouse [name]  to discuss diagnosis prognosis, GOC, EOL wishes, disposition and options.  Patient is alert - speech somewhat slurred. Difficult to assess orientation. Seems to have some periods of confusion.   I introduced Palliative Medicine as specialized medical care for people living with serious illness. It focuses on providing relief from the symptoms and stress of a serious illness. The goal is to improve quality of life for both the patient and the family.  We discussed a brief life review of the patient. Patient tells me she and [name] have been married for 30 years.   As far as functional and nutritional status, [name] tells me patient is mostly sedentary - when she  does get up to go to the bathroom she always needs assistance. He tells me of very poor PO intake - almost no food for 5 days - some sips of liquids.    We discussed patient's current illness and what it means in the larger context of patient's on-going co-morbidities.  Natural disease trajectory and expectations at EOL were discussed.  We discuss recent pause in chemo. [name] is eager to speak with oncology regarding plans moving forward.   I attempted to elicit values and goals of care important to the patient.    The difference between aggressive medical intervention and comfort care was considered in light of the patient's goals of care.   [name] shares that in the past patient has expressed a desire for full code interventions - would want attempts at resuscitation but would never want to be on ventilator long term. Encouraged patient/family to consider DNR/DNI status understanding evidenced based poor outcomes in similar hospitalized patients, as the cause of the arrest is likely associated with chronic/terminal disease rather than a reversible acute cardio-pulmonary event. We discussed that continuing current medical interventions with limit of DNR may be most appropriate at this point as it is unlikely that she would benefit from aggressive resuscitation interventions. [name] expresses understanding and tells me he will consider this conversation and take time to discuss with patient.   Discussed with patient/family the importance of continued conversation with family and the medical providers regarding overall plan of care and treatment options, ensuring decisions are within the context of the patient's values and GOCs.    Patient tells me she feels well - best she has felt in days following administration of IV fluids. No c/o of pain, nausea.   Questions and  concerns were addressed. The family was encouraged to call with questions or concerns.   Primary Decision Maker PATIENT though her  mental status waxes/wanes - husband [name] if patient unable   SUMMARY OF RECOMMENDATIONS   - code status discussion, strongly encouraged patient and family to consider DNR - they express understanding and will take time to consider but no decision made during this conversation - OP palliative referral was made by cancer center already, patient likely hospice appropriate but will wait for oncology to weigh in and discuss with patient/family - will ask Billey Chang, NP to follow up Monday  Code Status/Advance Care Planning: Full code      Primary Diagnoses: Present on Admission:  AKI (acute kidney injury) ([place])   I have reviewed the medical record, interviewed the patient and family, and examined the patient. The following aspects are pertinent.  Past Medical History:  Diagnosis Date   Cancer [facility])    Pulmonary nodule    Social History   Socioeconomic History   Marital status: Married    Spouse name: Not on file   Number of children: Not on file   Years of education: Not on file   Highest education level: Not on file  Occupational History   Not on file  Tobacco Use   Smoking status: Former    Packs/day: 0.50    Years: 50.00    Pack years: 25.00    Types: Cigarettes    Quit date: 04/11/2021    Years since quitting: 0.2   Smokeless tobacco: Never   Tobacco comments:    Quit one month ago  Vaping Use   Vaping Use: Never used  Substance and Sexual Activity   Alcohol use: Never   Drug use: Never   Sexual activity: Not on file  Other Topics Concern   Not on file  Social History Narrative   Lives in [place] with husband; 2 grown sons; quit smoking- June 2022. No alcohol.    Social Determinants of Health   Financial Resource Strain: Not on file  Food Insecurity: Not on file  Transportation Needs: Not on file  Physical Activity: Not on file  Stress: Not on file  Social Connections: Not on file   Family History  Problem Relation Age of Onset   Cancer Sister     Scheduled Meds:  apixaban  5 mg Oral BID   Chlorhexidine Gluconate Cloth  6 each Topical Daily   feeding supplement  237 mL Oral BID BM   fluconazole  100 mg Oral Daily   Continuous Infusions:  sodium chloride 50 mL/hr at 07/27/21 0442   PRN Meds:.albuterol, ibuprofen No Known Allergies Review of Systems  Constitutional:  Positive for activity change and appetite change.  Gastrointestinal:  Negative for nausea.  Neurological:  Positive for weakness.   Physical Exam Constitutional:      General: She is not in acute distress. Pulmonary:     Effort: Pulmonary effort is normal.  Skin:    General: Skin is warm and dry.  Neurological:     Mental Status: She is alert.     Comments: Periods of confusion    Vital Signs: BP 103/70 (BP Location: Left Arm)   Pulse 96   Temp 97.7 F (36.5 C)   Resp 20   Ht '4\' 11"'  (1.499 m)   Wt 39.3 kg   SpO2 100%   BMI 17.50 kg/m  Pain Scale: 0-10   Pain Score: 0-No pain   SpO2: SpO2: 100 %  O2 Device:SpO2: 100 % O2 Flow Rate: .   IO: Intake/output summary:  Intake/Output Summary (Last 24 hours) at 07/27/2021 0951 Last data filed at 07/27/2021 0442 Gross per 24 hour  Intake 585.53 ml  Output --  Net 585.53 ml    LBM: Last BM Date: 07/26/21 (per pt's husband) Baseline Weight: Weight: 39.3 kg Most recent weight: Weight: 39.3 kg     Palliative Assessment/Data: PPS 20%    Time Total: 60 minutes Greater than 50%  of this time was spent counseling and coordinating care related to the above assessment and plan.  Juel Burrow, DNP, AGNP-C Palliative Medicine Team [phone] Pager: [phone]

## 2021-07-27 NOTE — Progress Notes (Signed)
PROGRESS NOTE    Cathy Glover DOB: 03/19/1956 DOA: 07/26/2021 PCP: Cammie Sickle, MD  Outpatient Specialists: oncology    Brief Narrative:   From admission hpi Cathy Glover is a 65 y.o. 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  She has not eaten for more than a week.  She has had small sips of liquids intake.  Denies odynophagia but sometimes feels like food gets stuck in her throat.  Due to concern for dehydration, her husband contacted her oncologist for assistance. A month ago, her chemotherapy/immunotherapy was discontinued due to neutropenic sepsis and declining performance status.  Patient has been off chemotherapy for over a month.  She was recently admitted for Pseudomonas bacteremia and hospitalized between 06/17/2021 until 06/21/2021 with neutropenic sepsis.  She last saw her oncologist Dr. Lynett Fish on 05/03/2021 with plan to repeat CT scans on 08/02/2021.  She is mostly in the bed and becomes quite short of breath with minimal exertion.  Reports a persistent non productive cough.     Assessment & Plan:   Active Problems:   AKI (acute kidney injury) ([place])  # Metastatic lung cancer # Failure to thrive Recent admit for neutropenia and pseudomonas bacteremia. Chemo on hold. Prognosis poor. - ct chest/abdomen/pelvis ordered per onc recs - palliative following - oncology to see  # Decreased appetite # Possible oral candidiasis No dysphagia but dark plaques on tongue. Overall this all likely 2/2 her advanced cancer, but reasonable to treat for candida -  continue home fluconazole - SLP and RD following  # AKI Prerenal, mild, resolved w/ fluids - continue gentle hydration  # Hypernatremia 2/2 dehydration, resolved w/ fluids - monitor  # Anemia of chronic disease Hgb 8s, stable - monitor  # Hx DVT - cont apixaban  # Malnutrition, severe - RD consult   DVT prophylaxis: home apixaban Code Status: full Family Communication: husband updated @ bedside 9/16  Level of care: Med-Surg Status is: Inpatient  Remains inpatient appropriate because:Inpatient level of care appropriate due to severity of illness  Dispo:  Patient From: Home  Planned Disposition: Home  Medically stable for discharge: No          Consultants:  Oncology, palliative care  Procedures: none  Antimicrobials:  none    Subjective: No pain, no appetite, no nausea/vomiting.  Objective: Vitals:   07/27/21 0400 07/27/21 0511 07/27/21 0854 07/27/21 0927  BP:  122/73 103/70   Pulse: 96 97 96   Resp: 14 16 20    Temp:  97.7 F (36.5 C) 97.7 F (36.5 C)   TempSrc:  Oral    SpO2: 99% 100% 100% 100%  Weight:      Height:        Intake/Output Summary (Last 24 hours) at 07/27/2021 1104 Last data filed at 07/27/2021 0442 Gross per 24 hour  Intake 585.53 ml  Output --  Net 585.53 ml   Filed Weights   07/26/21 1156 07/26/21 2028  Weight: 39.3 kg 39.3 kg    Examination:  General exam: Appears calm and comfortable. Chronically ill appearing Respiratory system: Clear to auscultation. Respiratory effort normal. Cardiovascular system: S1 & S2 heard, RRR. No  JVD, murmurs, rubs, gallops or clicks. No pedal edema. Gastrointestinal system: Abdomen is nondistended, soft and nontender. No organomegaly or masses felt. Normal bowel sounds heard. Central nervous system: Alert and oriented. No focal neurological deficits. Extremities: moves all 4 Skin: no lesions Psychiatry: Judgement and insight appear normal. Affect is flat    Data Reviewed: I  have personally reviewed following labs and imaging studies  CBC: Recent Labs  Lab 07/26/21 1243 07/27/21 0446  WBC 7.4 5.0  HGB 10.8* 8.3*  HCT 34.2* 26.4*  MCV 104.6* 106.0*  PLT 459* 161   Basic Metabolic Panel: Recent Labs  Lab 07/26/21 1243 07/27/21 0446  NA 148* 144  K 4.1 3.6  CL 112* 117*  CO2 20* 21*  GLUCOSE 149* 109*  BUN 43* 35*  CREATININE 1.34* 0.92  CALCIUM 9.8 8.6*  MG  --  2.4  PHOS  --  3.1   GFR: Estimated Creatinine Clearance: 37.3 mL/min (by C-G formula based on SCr of 0.92 mg/dL). Liver Function Tests: Recent Labs  Lab 07/26/21 1243 07/27/21 0446  AST 69* 53*  ALT 56* 50*  ALKPHOS 122 100  BILITOT 1.4* 1.0  PROT 8.1 6.4*  ALBUMIN 3.6 2.9*   Recent Labs  Lab 07/26/21 1243  LIPASE 52*   No results for input(s): AMMONIA in the last 168 hours. Coagulation Profile: No results for input(s): INR, PROTIME in the last 168 hours. Cardiac Enzymes: Recent Labs  Lab 07/26/21 1243  CKTOTAL 38   BNP (last 3 results) No results for input(s): PROBNP in the last 8760 hours. HbA1C: No results for input(s): HGBA1C in the last 72 hours. CBG: No results for input(s): GLUCAP in the last 168 hours. Lipid Profile: No results for input(s): CHOL, HDL, LDLCALC, TRIG, CHOLHDL, LDLDIRECT in the last 72 hours. Thyroid Function Tests: Recent Labs    07/26/21 1243  TSH 12.621*  FREET4 1.05   Anemia Panel: No results for input(s): VITAMINB12, FOLATE, FERRITIN, TIBC, IRON, RETICCTPCT in the last 72 hours. Urine analysis:    Component Value Date/Time   COLORURINE YELLOW (A) 06/17/2021 1104   APPEARANCEUR HAZY (A) 06/17/2021 1104   LABSPEC 1.024 06/17/2021 1104   PHURINE 6.0 06/17/2021 1104   GLUCOSEU 150 (A) 06/17/2021 1104   HGBUR LARGE (A) 06/17/2021 1104   BILIRUBINUR NEGATIVE 06/17/2021 1104   KETONESUR NEGATIVE 06/17/2021 1104   PROTEINUR 30 (A) 06/17/2021 1104   NITRITE POSITIVE (A) 06/17/2021 1104   LEUKOCYTESUR NEGATIVE 06/17/2021 1104    Sepsis Labs: @LABRCNTIP (procalcitonin:4,lacticidven:4)  ) Recent Results (from the past 240 hour(s))  Resp Panel by RT-PCR (Flu A&B, Covid) Nasopharyngeal Swab     Status: None   Collection Time: 07/26/21  1:42 PM   Specimen: Nasopharyngeal Swab; Nasopharyngeal(NP) swabs in vial transport medium  Result Value Ref Range Status   SARS Coronavirus 2 by RT PCR NEGATIVE NEGATIVE Final    Comment: (NOTE) SARS-CoV-2 target nucleic acids are NOT DETECTED.  The SARS-CoV-2 RNA is generally detectable in upper respiratory specimens during the acute phase of infection. The lowest concentration of SARS-CoV-2 viral copies this assay can detect is 138 copies/mL. A negative result does not preclude SARS-Cov-2 infection and should not be used as the sole basis for treatment or other patient management decisions. A negative result may occur with  improper specimen collection/handling, submission of specimen other than nasopharyngeal swab, presence of viral mutation(s) within the areas targeted by this assay, and inadequate number of viral copies(<138 copies/mL). A negative result must be combined with clinical  observations, patient history, and epidemiological information. The expected result is Negative.  Fact Sheet for Patients:  EntrepreneurPulse.com.au  Fact Sheet for Healthcare Providers:  IncredibleEmployment.be  This test is no t yet approved or cleared by the [place] FDA and  has been authorized for detection and/or diagnosis of SARS-CoV-2 by FDA under an Emergency Use Authorization (EUA). This EUA will remain  in effect (meaning this test can be used) for the duration of the COVID-19 declaration under Section 564(b)(1) of the Act, 21 U.S.C.section 360bbb-3(b)(1), unless the authorization is terminated  or revoked sooner.       Influenza A by PCR NEGATIVE NEGATIVE Final   Influenza B by PCR NEGATIVE NEGATIVE Final    Comment: (NOTE) The  Xpert Xpress SARS-CoV-2/FLU/RSV plus assay is intended as an aid in the diagnosis of influenza from Nasopharyngeal swab specimens and should not be used as a sole basis for treatment. Nasal washings and aspirates are unacceptable for Xpert Xpress SARS-CoV-2/FLU/RSV testing.  Fact Sheet for Patients: EntrepreneurPulse.com.au  Fact Sheet for Healthcare Providers: IncredibleEmployment.be  This test is not yet approved or cleared by the [place] FDA and has been authorized for detection and/or diagnosis of SARS-CoV-2 by FDA under an Emergency Use Authorization (EUA). This EUA will remain in effect (meaning this test can be used) for the duration of the COVID-19 declaration under Section 564(b)(1) of the Act, 21 U.S.C. section 360bbb-3(b)(1), unless the authorization is terminated or revoked.  Performed at [facility], [address], [place], [place] [address]          Radiology Studies: CT HEAD WO CONTRAST (5MM)  Result Date: 07/26/2021 CLINICAL DATA:  Neurological deficit EXAM: CT HEAD WITHOUT CONTRAST TECHNIQUE: Contiguous axial images were obtained from the base of the skull through the vertex without intravenous contrast. COMPARISON:  MRI head dated May 04, 2021 FINDINGS: Brain: Linear area of hypoattenuation involving the temporoparietal cortex associated calcification, likely treated metastatic disease. No new mass, evidence of acute infarct, hydrocephalus or extra-axial collection. Vascular: No hyperdense vessel or unexpected calcification. Skull: Fluid is seen in the right inner ear and mastoid airspaces. Negative for fracture or focal lesion. Sinuses/Orbits: Air-fluid level of the sphenoid sinus. Other: None. IMPRESSION: No acute intracranial abnormality. Air-fluid level of the sphenoid sinus, findings can be seen in the setting of acute sinusitis Electronically Signed   By: Yetta Glassman M.D.   On: 07/26/2021 14:51   DG  Chest Portable 1 View  Result Date: 07/26/2021 CLINICAL DATA:  Weakness, sepsis, metastatic lung cancer EXAM: PORTABLE CHEST 1 VIEW COMPARISON:  06/17/2021, 05/03/2021 FINDINGS: Single frontal view of the chest demonstrates right chest wall port via internal jugular approach tip overlying superior vena cava. The cardiac silhouette is unremarkable. There is increased consolidation within the right middle lobe, now obscuring the right hilar mass seen previously. Nodularity within the lateral right lung base consistent with metastatic disease observed on previous imaging. No effusion or pneumothorax. No acute bony abnormalities. IMPRESSION: 1. Increasing consolidation within the right middle lobe, obscuring the right hilar mass seen previously. This may reflect progression of disease and/or increasing postobstructive change. 2. Nodularity at the right lateral lung base consistent with metastatic disease seen previously. Electronically Signed   By: Randa Ngo M.D.   On: 07/26/2021 15:14        Scheduled Meds:  apixaban  5 mg Oral BID   Chlorhexidine Gluconate Cloth  6 each Topical Daily   feeding supplement  237 mL Oral BID BM  fluconazole  100 mg Oral Daily   Continuous Infusions:  sodium chloride 50 mL/hr at 07/27/21 0442     LOS: 1 day    Time spent: 74 min    Desma Maxim, MD [facility]   If 7PM-7AM, please contact night-coverage [email or web address] Password [facility] 07/27/2021, 11:04 AM

## 2021-07-27 NOTE — Evaluation (Signed)
Physical Therapy Evaluation Patient Details Name: Cathy Glover MRN: [id number] DOB: 1956/11/10 Today's Date: 07/27/2021  History of Present Illness  65 y.o. 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  A month ago, her chemotherapy/immunotherapy was discontinued due to neutropenic sepsis and declining performance status.  Patient has been off chemotherapy for over a month.  She was recently admitted for Pseudomonas bacteremia and hospitalized between 06/17/2021 until 06/21/2021 with neutropenic sepsis.  Currently presented to ER secondary to progressive weakness, decreased PO intake, failure to thrive; admitted for management of AKI, hyponatremia, FTT.  Clinical Impression  Patient resting in bed upon arrival to room; husband at bedside, supportive and encouraging to patient.  Patient alert and oriented to self, location as hospital only; does follow simple commands (optimized with demonstration from therapist), but limited ability to initiate and sequence functional activities and little/no carry-over of new information.  Generally weak and deconditioned throughout all extremities, but no gross focal weakness appreciated. Able to complete bed mobility with mod/max assist; unsupported sitting balance, mod/max assist; sit/stand, basic transfers and gait (5') with RW, min/mod assist.  Demonstrates R lateral lean with limited L ant/lateral weight shift, decreased R LE step height/length and foot clearance; poor midline orientation and dynamic balance reactions. Unsafe to attempt without RW and +1 at all times Would benefit from skilled PT to  address above deficits and promote optimal return to PLOF.; Recommend transition to HHPT with 24/7 supervision/assist upon discharge from acute hospitalization.      Recommendations for follow up therapy are one component of a multi-disciplinary discharge planning process, led by the attending physician.  Recommendations may be updated based on patient status, additional functional criteria and insurance authorization.  Follow Up Recommendations  (home with 24/7 supervision/assist)    Equipment Recommendations  Wheelchair (measurements PT);Wheelchair cushion (measurements PT);Hospital bed;3in1 (PT)    Recommendations for Other Services       Precautions / Restrictions Precautions Precautions: Fall Precaution Comments: R chest port Restrictions Weight Bearing Restrictions: No      Mobility  Bed Mobility Overal bed mobility: Needs Assistance Bed Mobility: Supine to Sit     Supine to sit: Mod assist Sit to supine: Mod assist;Max assist   General bed mobility comments: limited ability to coordinate and sequence movement transition, requiring hand-over-hand to initiate and guide    Transfers Overall transfer level: Needs assistance Equipment used: Rolling walker (2 wheeled) Transfers: Sit to/from Stand Sit to Stand: Min assist            Ambulation/Gait Ambulation/Gait assistance: Min assist;Mod assist Gait Distance (Feet): 5 Feet (forward/backward) Assistive device: Rolling walker (2 wheeled)       General Gait Details: R lateral lean with limited L ant/lateral weight shift, decreased R LE step height/length and foot clearance; poor midline orientation and dynamic balance reactions. Unsafe to attempt without RW and +1 at all times  Stairs            Wheelchair Mobility    Modified Rankin (Stroke Patients Only)       Balance Overall balance assessment: Needs assistance Sitting-balance support: No upper extremity supported;Feet supported Sitting  balance-Leahy Scale: Poor Sitting balance - Comments: R posterior/lateral lean, minimal/no  attempts at spontaneous correction   Standing balance support: Bilateral upper extremity supported Standing balance-Leahy Scale: Poor Standing balance comment: R lateral lean                             Pertinent Vitals/Pain Pain Assessment: No/denies pain    Home Living Family/patient expects to be discharged to:: Private residence Living Arrangements: Spouse/significant other Available Help at Discharge: Family;Available 24 hours/day Type of Home: House Home Access: Stairs to enter Entrance Stairs-Rails: None Entrance Stairs-Number of Steps: 3-4 Home Layout: One level Home Equipment: Cane - quad;Walker - 2 wheels;Shower seat      Prior Function Level of Independence: Needs assistance   Gait / Transfers Assistance Needed: Pt ambulates with RW and assistance from husband. She has had several occasions of L knee buckling where he has caught her or lowered her to floor.  ADL's / Homemaking Assistance Needed: Use of shower chair for bathing and assist as needed from spouse  Comments: Pt was working until July and has been progressively getting weaker, started chemo, and has not been eating well at home.     Hand Dominance        Extremity/Trunk Assessment   Upper Extremity Assessment Upper Extremity Assessment: Generalized weakness    Lower Extremity Assessment Lower Extremity Assessment: Generalized weakness (grossly 4-/5 throughout)       Communication   Communication: No difficulties  Cognition Arousal/Alertness: Awake/alert Behavior During Therapy: WFL for tasks assessed/performed Overall Cognitive Status: Impaired/Different from baseline                                 General Comments: Oriented to self, location as hospital; limited insight into medical condition and overall functional needs      General Comments      Exercises Other  Exercises Other Exercises: Unsupported sitting balance edge of bed, mod/max assist for R posterior/lateral lean. Worked to improve awareness of midline in both A/P and M/L plans; limited corrective righting noted   Assessment/Plan    PT Assessment Patient needs continued PT services  PT Problem List Decreased strength;Decreased activity tolerance;Decreased balance;Decreased knowledge of use of DME;Decreased cognition;Decreased coordination;Decreased mobility;Decreased safety awareness;Decreased knowledge of precautions       PT Treatment Interventions DME instruction;Gait training;Functional mobility training;Therapeutic activities;Therapeutic exercise;Balance training;Neuromuscular re-education;Cognitive remediation;Patient/family education    PT Goals (Current goals can be found in the Care Plan section)  Acute Rehab PT Goals Patient Stated Goal: to return home PT Goal Formulation: With patient/family Time For Goal Achievement: 08/10/21 Potential to Achieve Goals: Fair    Frequency Min 2X/week   Barriers to discharge        Co-evaluation               AM-PAC PT "6 Clicks" Mobility  Outcome Measure Help needed turning from your back to your side while in a flat bed without using bedrails?: A Little Help needed moving from lying on your back to sitting on the side of a flat bed without using bedrails?: A Lot Help needed moving to and from a bed to a chair (including a wheelchair)?: A Lot Help needed standing up from a chair using your arms (e.g., wheelchair or bedside chair)?: A Little Help needed to walk in hospital room?: A Lot Help needed climbing 3-5 steps with a railing? : A Lot 6 Click Score: 14    End  of Session   Activity Tolerance: Patient limited by fatigue Patient left: in bed;with call bell/phone within reach;with bed alarm set;with family/visitor present Nurse Communication: Mobility status PT Visit Diagnosis: Muscle weakness (generalized)  (M62.81);Difficulty in walking, not elsewhere classified (R26.2)    Time: 8546-2703 PT Time Calculation (min) (ACUTE ONLY): 29 min   Charges:   PT Evaluation $PT Eval Moderate Complexity: 1 Mod PT Treatments $Therapeutic Activity: 8-22 mins        Earlyn Sylvan H. Owens Shark, PT, DPT, NCS 07/27/21, 5:06 PM [phone]

## 2021-07-27 NOTE — Progress Notes (Signed)
Initial Nutrition Assessment  DOCUMENTATION CODES:  Severe malnutrition in context of chronic illness  INTERVENTION:  Continue current diet as ordered, encourage PO intake Ensure Enlive po TID, each supplement provides 350 kcal and 20 grams of protein Prosource Plus BID, this will provide 100kcal and 15g of protein per packet Magic cup TID with meals, each supplement provides 290 kcal and 9 grams of protein MVI with minerals daily  NUTRITION DIAGNOSIS:  Severe Malnutrition (in the context of chronic illness) related to cancer and cancer related treatments, poor appetite as evidenced by severe fat depletion, severe muscle depletion, percent weight loss (16.4% x 3 months).  GOAL:  Patient will meet greater than or equal to 90% of their needs  MONITOR:  PO intake, Supplement acceptance  REASON FOR ASSESSMENT:  Consult Assessment of nutrition requirement/status  ASSESSMENT:  65 y.o. female with history of stage IV small cell lung cancer with mets to the lymph nodes, adrenal gland, and brain s/p chemo and radiation, COPD, and FTT, presented to ED from home due to failure to thrive and poor oral intake. Husband reports pt has not had any food for almost a week and will only drink a few ounces of fluids per day now  Pt recently admitted for neutropenic sepsis 8/7-8/11. Chemotherapy was discontinued at that time due to poor tolerance and weakness. Husband reports steady decline at home since that admission. Requires assistance to ambulate, not eating or drinking, and respiratory status decline.  Pt resting in bed at the time of visit, husband at bedside. Pt reports that she didn't eat much breakfast, had several providers coming in to work with her. Did eat ~1/2 of a Magic cup with SLP during evaluation. Pt reports that she has been sipping on some drinks this AM. Appetite is much improved from the previous week, but still extremely poor and inadequate to support needs. Pt on IVF at this time  to help re-hydrate.  Noted that pt has lost 16.4% of her body weight in the last 3 months which is an extreme amount and quite severe. Talked to pt and husband about the consequences of poor intake on functional status and overall success with cancer treatments. Stressed the importance of trying to get adequate nutrition and protein. Does not like nutrition supplements typically, but is willing to try the new ensume plus max protein and a prosource packet. Both provided to pt to taste.  Being followed by RD at [facility]. Pt notes taste changes - may benefit from education regarding techniques to combat these changes. Encouraged good oral hygiene and swishing a mixture of water with salt and baking soda prior to all meals and snacks to help clear any bad taste from mouth.  Nutritionally Relevant Medications: Scheduled Meds:  feeding supplement  237 mL Oral BID BM   Continuous Infusions:  sodium chloride 50 mL/hr at 07/27/21 0442   Labs Reviewed: BUN 35  NUTRITION - FOCUSED PHYSICAL EXAM: Flowsheet Row Most Recent Value  Orbital Region Severe depletion  Upper Arm Region Moderate depletion  Thoracic and Lumbar Region Severe depletion  Buccal Region Severe depletion  Temple Region Moderate depletion  Clavicle Bone Region Mild depletion  Clavicle and Acromion Bone Region Moderate depletion  Scapular Bone Region Mild depletion  Dorsal Hand Severe depletion  Patellar Region Severe depletion  Anterior Thigh Region Severe depletion  Posterior Calf Region Severe depletion  Edema (RD Assessment) None  Hair Reviewed  Eyes Reviewed  Mouth Reviewed  Skin Reviewed  Nails Reviewed  Diet Order:   Diet Order             Diet regular Room service appropriate? Yes; Fluid consistency: Thin  Diet effective now                   EDUCATION NEEDS:  Education needs have been addressed  Skin:  Skin Assessment: Reviewed RN Assessment  Last BM:  9/15  Height:  Ht Readings from Last  1 Encounters:  07/26/21 4\' 11"  (1.499 m)   Weight:  Wt Readings from Last 1 Encounters:  07/27/21 41.7 kg   Ideal Body Weight:  44.3 kg  BMI:  Body mass index is 18.57 kg/m.  Estimated Nutritional Needs:  Kcal:  1400-1600 kcal/d Protein:  70-90 g/d Fluid:  1.5-1.8 L/d   Ranell Patrick, RD, LDN Clinical Dietitian Pager on [place]

## 2021-07-27 NOTE — Assessment & Plan Note (Addendum)
#  65 year old female patient with history of extensive stage small cell lung cancer is currently admitted to hospital for generalized weakness/worsening shortness of breath  # Stage IV/extensive stage small cell lung cancer-with metastasis to the right hilum/supraclavicular/subpectoral/axillary adenopathy; adrenal metastases and pelvic adenopathy/brain metastases status post whole brain radiation.  Currently s/p 2 cycles of carbo etoposide plus immunotherapy.  Restaging scans-July 27, 2021-shows progressive disease.  See discussion below  #Declining performance status-ECOG performance status 3; also multiple interruptions to therapy including neutropenic sepsis/cellulitis/failure to thrive.  Plan: I reviewed the findings of the progressive disease with the patient and husband detail.  Patient unfortunately has primary refractory small cell lung cancer-which is portends extremely poor prognosis.  Patient is a poor candidate for any further therapy given her multiple co-morbidities/poor tolerance to therapy/and high risk of death from chemotherapy.  I would recommend hospice given her life expectancy less than 6 months if malignancy takes natural course.  Discussed with Josh Borders/palliative care.  Also discussed with [place], hospice services.   # I reviewed the blood work- with the patient in detail; also reviewed the imaging independently [as summarized above]; and with the patient in detail.   # 40 minutes face-to-face with the patient discussing the above plan of care; more than 50% of time spent on prognosis/ natural history; counseling and coordination.

## 2021-07-27 NOTE — Progress Notes (Signed)
[facility] CONSULT NOTE  Patient Care Team: Cammie Sickle, MD as PCP - General (Internal Medicine) Telford Nab, RN as Oncology Nurse Navigator  CHIEF COMPLAINTS/PURPOSE OF CONSULTATION: Lung cancer  HISTORY OF PRESENTING ILLNESS:  Cathy Glover 65 y.o.  female history of small cell lung cancer stage IV with brain metastasis; history of neutropenic sepsis/orbital cellulitis is currently admitted to hospital for failure to thrive/poor P.o. intake/and tends to fall.  Patient complains of poor appetite.  Positive for weight loss.  She complains of worsening shortness of breath.  Also positive for nonproductive cough.  No hemoptysis.  On admission to hospital patient was noted to have sodium of 148; creatinine up to 1.5.  Patient was admitted to hospital for further treatment evaluation.   Patient is accompanied by her husband at the bedside.  No acute distress.  Review of Systems  Constitutional:  Positive for malaise/fatigue and weight loss. Negative for chills, diaphoresis and fever.  HENT:  Negative for nosebleeds and sore throat.   Eyes:  Negative for double vision.  Respiratory:  Positive for cough, sputum production and shortness of breath. Negative for wheezing.   Cardiovascular:  Negative for chest pain, palpitations, orthopnea and leg swelling.  Gastrointestinal:  Positive for constipation. Negative for abdominal pain, blood in stool, diarrhea, heartburn, melena, nausea and vomiting.  Genitourinary:  Negative for dysuria, frequency and urgency.  Musculoskeletal:  Positive for back pain and joint pain.  Skin: Negative.  Negative for itching and rash.  Neurological:  Negative for dizziness, tingling, focal weakness, weakness and headaches.  Endo/Heme/Allergies:  Does not bruise/bleed easily.  Psychiatric/Behavioral:  Negative for depression. The patient is not nervous/anxious and does not have insomnia.     MEDICAL HISTORY:  Past Medical History:   Diagnosis Date   Cancer [facility])    Pulmonary nodule     SURGICAL HISTORY: Past Surgical History:  Procedure Laterality Date   IR IMAGING GUIDED PORT INSERTION  05/11/2021   VIDEO BRONCHOSCOPY WITH ENDOBRONCHIAL ULTRASOUND N/A 04/25/2021   Procedure: VIDEO BRONCHOSCOPY WITH ENDOBRONCHIAL ULTRASOUND;  Surgeon: Tyler Pita, MD;  Location: [facility] ORS;  Service: Cardiopulmonary;  Laterality: N/A;    SOCIAL HISTORY: Social History   Socioeconomic History   Marital status: Married    Spouse name: Not on file   Number of children: Not on file   Years of education: Not on file   Highest education level: Not on file  Occupational History   Not on file  Tobacco Use   Smoking status: Former    Packs/day: 0.50    Years: 50.00    Pack years: 25.00    Types: Cigarettes    Quit date: 04/11/2021    Years since quitting: 0.2   Smokeless tobacco: Never   Tobacco comments:    Quit one month ago  Vaping Use   Vaping Use: Never used  Substance and Sexual Activity   Alcohol use: Never   Drug use: Never   Sexual activity: Not on file  Other Topics Concern   Not on file  Social History Narrative   Lives in [place] with husband; 2 grown sons; quit smoking- June 2022. No alcohol.    Social Determinants of Health   Financial Resource Strain: Not on file  Food Insecurity: Not on file  Transportation Needs: Not on file  Physical Activity: Not on file  Stress: Not on file  Social Connections: Not on file  Intimate Partner Violence: Not on file    FAMILY HISTORY:  Family History  Problem Relation Age of Onset   Cancer Sister     ALLERGIES:  has No Known Allergies.  MEDICATIONS:  Current Facility-Administered Medications  Medication Dose Route Frequency Provider Last Rate Last Admin   (feeding supplement) PROSource Plus liquid 30 mL  30 mL Oral BID BM Wouk, Ailene Rud, MD   30 mL at 07/27/21 1514   albuterol (PROVENTIL) (2.5 MG/3ML) 0.083% nebulizer solution 3 mL  3 mL Inhalation  Q6H PRN Irene Pap N, DO   3 mL at 07/27/21 2114   apixaban (ELIQUIS) tablet 5 mg  5 mg Oral BID Irene Pap N, DO   5 mg at 07/27/21 2100   Chlorhexidine Gluconate Cloth 2 % PADS 6 each  6 each Topical Daily Kayleen Memos, DO   6 each at 07/27/21 0926   dextrose 5 %-0.45 % sodium chloride infusion   Intravenous Continuous Gwynne Edinger, MD 75 mL/hr at 07/27/21 1802 Infusion Verify at 07/27/21 1802   feeding supplement (ENSURE ENLIVE / ENSURE PLUS) liquid 237 mL  237 mL Oral TID BM Wouk, Ailene Rud, MD       fluconazole (DIFLUCAN) tablet 100 mg  100 mg Oral Daily Irene Pap N, DO   100 mg at 07/27/21 6761   ibuprofen (ADVIL) tablet 400 mg  400 mg Oral Q6H PRN Kayleen Memos, DO       multivitamin with minerals tablet 1 tablet  1 tablet Oral Daily Gwynne Edinger, MD   1 tablet at 07/27/21 1514      .  PHYSICAL EXAMINATION:  Vitals:   07/27/21 2114 07/27/21 2121  BP:  132/71  Pulse:  98  Resp:  18  Temp:  98 F (36.7 C)  SpO2: 95% 98%   Filed Weights   07/26/21 1156 07/26/21 2028 07/27/21 1009  Weight: 86 lb 11.2 oz (39.3 kg) 86 lb 10.3 oz (39.3 kg) 91 lb 14.9 oz (41.7 kg)    Physical Exam   LABORATORY DATA:  I have reviewed the data as listed Lab Results  Component Value Date   WBC 5.0 07/27/2021   HGB 8.3 (L) 07/27/2021   HCT 26.4 (L) 07/27/2021   MCV 106.0 (H) 07/27/2021   PLT 329 07/27/2021   Recent Labs    06/05/21 0946 06/12/21 1325 07/17/21 0935 07/26/21 1243 07/27/21 0446  NA  --    < > 139 148* 144  K  --    < > 4.2 4.1 3.6  CL  --    < > 109 112* 117*  CO2  --    < > 19* 20* 21*  GLUCOSE  --    < > 159* 149* 109*  BUN  --    < > 20 43* 35*  CREATININE  --    < > 0.99 1.34* 0.92  CALCIUM  --    < > 8.9 9.8 8.6*  GFRNONAA  --    < > >60 44* >60  PROT 6.4*   < > 8.3* 8.1 6.4*  ALBUMIN 3.1*   < > 3.2* 3.6 2.9*  AST 25   < > 22 69* 53*  ALT 38   < > 10 56* 50*  ALKPHOS 57   < > 110 122 100  BILITOT 0.5   < > 0.8 1.4* 1.0  BILIDIR  <0.1  --   --   --   --   IBILI NOT CALCULATED  --   --   --   --    < > =  values in this interval not displayed.    RADIOGRAPHIC STUDIES: I have personally reviewed the radiological images as listed and agreed with the findings in the report. CT HEAD WO CONTRAST (5MM)  Result Date: 07/26/2021 CLINICAL DATA:  Neurological deficit EXAM: CT HEAD WITHOUT CONTRAST TECHNIQUE: Contiguous axial images were obtained from the base of the skull through the vertex without intravenous contrast. COMPARISON:  MRI head dated May 04, 2021 FINDINGS: Brain: Linear area of hypoattenuation involving the temporoparietal cortex associated calcification, likely treated metastatic disease. No new mass, evidence of acute infarct, hydrocephalus or extra-axial collection. Vascular: No hyperdense vessel or unexpected calcification. Skull: Fluid is seen in the right inner ear and mastoid airspaces. Negative for fracture or focal lesion. Sinuses/Orbits: Air-fluid level of the sphenoid sinus. Other: None. IMPRESSION: No acute intracranial abnormality. Air-fluid level of the sphenoid sinus, findings can be seen in the setting of acute sinusitis Electronically Signed   By: Yetta Glassman M.D.   On: 07/26/2021 14:51   CT CHEST ABDOMEN PELVIS W CONTRAST  Result Date: 07/27/2021 CLINICAL DATA:  Metastatic lung cancer restaging EXAM: CT CHEST, ABDOMEN, AND PELVIS WITH CONTRAST TECHNIQUE: Multidetector CT imaging of the chest, abdomen and pelvis was performed following the standard protocol during bolus administration of intravenous contrast. CONTRAST:  51mL OMNIPAQUE IOHEXOL 350 MG/ML SOLN, additional oral enteric contrast COMPARISON:  PET-CT, 05/03/2021, CT chest, 04/12/2021 FINDINGS: CT CHEST FINDINGS Cardiovascular: Right chest port catheter. Aortic atherosclerosis. Normal heart size. No pericardial effusion. Mediastinum/Nodes: Interval enlargement of bulky, matted mediastinal, right hilar, left supraclavicular, and left axillary  lymph nodes, largest left axillary node measuring 2.8 x 2.3 cm, previously 2.2 x 1.3 cm (series 2, image 8), largest pretracheal/anterior mediastinal node measuring 2.6 x 2.3 cm, previously 1.1 x 0.9 cm (series 2, image 17). Thyroid gland, trachea, and esophagus demonstrate no significant findings. Lungs/Pleura: Mild centrilobular and paraseptal emphysema. Bulky right hilar mass is slightly increased in size, difficult to accurately measure due to the presence of adjacent lymphadenopathy and postobstructive airspace disease, although approximately 6.0 x 4.9 cm, previously 4.0 x 3.9 cm when measured similarly (series 2, image 26). Interval increase in postobstructive consolidation of the right middle lobe, now complete. Significant mass effect on the right pulmonary artery and lobar branches, which narrows and nearly occludes the vessels. Spiculated, irregular nodules and opacities are increased in size and confluence compared to prior examination, largest nodule of the right lower lobe measuring 2.8 x 2.3 cm, previously 2.0 x 1.6 cm when measured similarly (series 5, image 62). There is new, somewhat geographic ground-glass airspace opacity of the right lung, particularly suprahilar right upper lobe (series 5, image 35), and additionally in the paramedian left upper lobe (series 5, image 32). Interlobular septal thickening throughout the right lung, increased compared to prior examination. No pleural effusion or pneumothorax. Musculoskeletal: No chest wall mass or suspicious bone lesions identified. CT ABDOMEN PELVIS FINDINGS Hepatobiliary: Unchanged hypodense lesion of the anterior midline liver, adjacent to the falciform ligament, not previously FDG PET avid and favored to reflect focal fatty deposition or perhaps a benign liver lesion such as a focal nodular hyperplasia (series 2, image 46). No gallstones, gallbladder wall thickening, or biliary dilatation. Pancreas: Unremarkable. No pancreatic ductal dilatation  or surrounding inflammatory changes. Spleen: Normal in size without significant abnormality. Adrenals/Urinary Tract: Significant interval enlargement of bilateral adrenal metastases, left adrenal mass measuring 3.2 x 2.1 cm, previously 2.1 x 1.7 cm (series 2, image 49). New, mild left hydronephrosis and hydroureter, without obstructing etiology  identified to the left ureterovesicular junction. Distended, somewhat thickened urinary bladder. Stomach/Bowel: Stomach is within normal limits. Appendix appears normal. No evidence of bowel wall thickening, distention, or inflammatory changes. Vascular/Lymphatic: Aortic atherosclerosis. Interval enlargement of right external iliac lymph nodes, measuring up to 2.4 x 1.8 cm, previously 2.1 x 1.3 cm (series 2, image 91). Reproductive: No mass or other abnormality. Other: No abdominal wall hernia or abnormality. No abdominopelvic ascites. Musculoskeletal: No acute or significant osseous findings. IMPRESSION: 1. Bulky right hilar mass is slightly increased in size, difficult to accurately measure due to the presence of adjacent lymphadenopathy and postobstructive airspace disease. 2. Interval increase in postobstructive consolidation of the right middle lobe, now complete. Significant mass effect on the right pulmonary artery and lobar branches, which narrows and nearly occludes the vessels. 3. There is interlobular septal thickening throughout the right lung, consistent with lymphangitic malignancy and/or interstitial edema due to pulmonary venous outflow obstruction. 4. Spiculated, irregular nodules and opacities throughout the right lung are increased in size and confluence compared to prior examination. 5. Interval enlargement of bulky, matted mediastinal, right hilar, left supraclavicular, left axillary, and right inguinal lymph nodes. 6. Significant interval enlargement of bilateral adrenal metastases. 7. Constellation of findings is consistent with worsened primary lung  malignancy and metastatic disease. 8. There is new, somewhat geographic ground-glass airspace opacity of the right lung, particularly suprahilar right upper lobe, and additionally in the paramedian left upper lobe, likely developing radiation pneumonitis. 9. Distended, somewhat thickened urinary bladder, with new, mild left hydronephrosis and hydroureter, possibly due to back pressure. Correlate for urinary retention. Aortic Atherosclerosis (ICD10-I70.0). Electronically Signed   By: Eddie Candle M.D.   On: 07/27/2021 19:21   DG Chest Portable 1 View  Result Date: 07/26/2021 CLINICAL DATA:  Weakness, sepsis, metastatic lung cancer EXAM: PORTABLE CHEST 1 VIEW COMPARISON:  06/17/2021, 05/03/2021 FINDINGS: Single frontal view of the chest demonstrates right chest wall port via internal jugular approach tip overlying superior vena cava. The cardiac silhouette is unremarkable. There is increased consolidation within the right middle lobe, now obscuring the right hilar mass seen previously. Nodularity within the lateral right lung base consistent with metastatic disease observed on previous imaging. No effusion or pneumothorax. No acute bony abnormalities. IMPRESSION: 1. Increasing consolidation within the right middle lobe, obscuring the right hilar mass seen previously. This may reflect progression of disease and/or increasing postobstructive change. 2. Nodularity at the right lateral lung base consistent with metastatic disease seen previously. Electronically Signed   By: Randa Ngo M.D.   On: 07/26/2021 15:14    Primary cancer of right lower lobe of lung [facility]) #65 year old female patient with history of extensive stage small cell lung cancer is currently admitted to hospital for generalized weakness/worsening shortness of breath   # Stage IV/extensive stage small cell lung cancer-with metastasis to the right hilum/supraclavicular/subpectoral/axillary adenopathy; adrenal metastases and pelvic  adenopathy/brain metastases status post whole brain radiation.  Currently s/p 2 cycles of carbo etoposide plus immunotherapy.  Restaging scans pending today.  #Declining performance status-ECOG performance status 3; also multiple interruptions to therapy including neutropenic sepsis/cellulitis/failure to thrive.  #Acute renal failure-prerenal s/p hydration.  Improved.  #Recommendations:  #Discussed with the patient/husband regarding the concerns for possible progression of disease given patient's worsening performance status/symptoms and also multiple interruptions in therapy/poor tolerance to therapy.   #However await CT scan chest and pelvis as planned today.  Also discussed with the husband that patient's MRI could be done based upon results/implications of the CT  imaging.  Thank you Dr.Wouk for allowing me to participate in the care of your pleasant patient. Please do not hesitate to contact me with questions or concerns in the interim.  Addendum: CT scan July 27, 2021-shows progressive disease-which at this point treatment options are limited/and given patient's declining performance status multiple comorbidities-hospice would be reasonable.  Discussed with Dr. Rao/covering on-call physician for the weekend.  This will be communicated to the patient/husband over the weekend.  All questions were answered. The patient knows to call the clinic with any problems, questions or concerns.   Cammie Sickle, MD 07/27/2021

## 2021-07-27 NOTE — Progress Notes (Deleted)
Attempted to call report to facility once more, placed on hold for 21 minutes then hung up and no response when called back to facility.

## 2021-07-28 DIAGNOSIS — R627 Adult failure to thrive: Secondary | ICD-10-CM | POA: Diagnosis not present

## 2021-07-28 DIAGNOSIS — C3431 Malignant neoplasm of lower lobe, right bronchus or lung: Secondary | ICD-10-CM | POA: Diagnosis not present

## 2021-07-28 LAB — COMPREHENSIVE METABOLIC PANEL
ALT: 31 U/L (ref 0–44)
AST: 26 U/L (ref 15–41)
Albumin: 2.6 g/dL — ABNORMAL LOW (ref 3.5–5.0)
Alkaline Phosphatase: 96 U/L (ref 38–126)
Anion gap: 8 (ref 5–15)
BUN: 21 mg/dL (ref 8–23)
CO2: 22 mmol/L (ref 22–32)
Calcium: 8.2 mg/dL — ABNORMAL LOW (ref 8.9–10.3)
Chloride: 111 mmol/L (ref 98–111)
Creatinine, Ser: 0.67 mg/dL (ref 0.44–1.00)
GFR, Estimated: >60 mL/min (ref >60)
Glucose, Bld: 126 mg/dL — ABNORMAL HIGH (ref 70–99)
Potassium: 3.1 mmol/L — ABNORMAL LOW (ref 3.5–5.1)
Sodium: 141 mmol/L (ref 135–145)
Total Bilirubin: 0.7 mg/dL (ref 0.3–1.2)
Total Protein: 5.7 g/dL — ABNORMAL LOW (ref 6.5–8.1)

## 2021-07-28 LAB — CBC
HCT: 24 % — ABNORMAL LOW (ref 36.0–46.0)
Hemoglobin: 7.4 g/dL — ABNORMAL LOW (ref 12.0–15.0)
MCH: 32.6 pg (ref 26.0–34.0)
MCHC: 30.8 g/dL (ref 30.0–36.0)
MCV: 105.7 fL — ABNORMAL HIGH (ref 80.0–100.0)
Platelets: 301 10*3/uL (ref 150–400)
RBC: 2.27 MIL/uL — ABNORMAL LOW (ref 3.87–5.11)
RDW: 17.7 % — ABNORMAL HIGH (ref 11.5–15.5)
WBC: 4.5 10*3/uL (ref 4.0–10.5)
nRBC: 0 % (ref 0.0–0.2)

## 2021-07-28 LAB — MAGNESIUM: Magnesium: 1.9 mg/dL (ref 1.7–2.4)

## 2021-07-28 MED ORDER — GUAIFENESIN-DM 100-10 MG/5ML PO SYRP
5.0000 mL | ORAL_SOLUTION | ORAL | Status: DC | PRN
  Administered 2021-07-28 – 2021-07-30 (×6): 5 mL via ORAL
  Filled 2021-07-28 (×6): qty 5

## 2021-07-28 MED ORDER — KCL IN DEXTROSE-NACL 20-5-0.45 MEQ/L-%-% IV SOLN
INTRAVENOUS | Status: DC
  Filled 2021-07-28 (×5): qty 1000

## 2021-07-28 NOTE — Progress Notes (Signed)
PROGRESS NOTE    Cathy Glover  ENI:[id number] DOB: 1956/01/17 DOA: 07/26/2021 PCP: Cammie Sickle, MD  Outpatient Specialists: oncology    Brief Narrative:   From admission hpi Cathy Glover is a 65 y.o. 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  She has not eaten for more than a week.  She has had small sips of liquids intake.  Denies odynophagia but sometimes feels like food gets stuck in her throat.  Due to concern for dehydration, her husband contacted her oncologist for assistance. A month ago, her chemotherapy/immunotherapy was discontinued due to neutropenic sepsis and declining performance status.  Patient has been off chemotherapy for over a month.  She was recently admitted for Pseudomonas bacteremia and hospitalized between 06/17/2021 until 06/21/2021 with neutropenic sepsis.  She last saw her oncologist Dr. Lynett Fish on 05/03/2021 with plan to repeat CT scans on 08/02/2021.  She is mostly in the bed and becomes quite short of breath with minimal exertion.  Reports a persistent non productive cough.     Assessment & Plan:   Active Problems:   AKI (acute kidney injury) ([place])   History of DVT in adulthood   Failure to thrive in adult   Protein-calorie malnutrition, severe  # Metastatic lung cancer # Failure to thrive Recent admit for neutropenia and pseudomonas bacteremia. Chemo on hold. Prognosis poor. CT c/a/p shows worsening disease. No viable tx options per onc. - pt desires to transition to DNR status, ordered - patient and husband interested in hospice at home, have  contacted [facility] who will contact family - palliative following, will likely transition to comfort care over the next 1-2 days.   # Decreased appetite # Possible oral candidiasis No dysphagia but dark plaques on tongue. Overall this all likely 2/2 her advanced cancer, but reasonable to treat for candida - continue home fluconazole - SLP and RD following  # Hypotension Mental status unchanged - continue hydration  # Hypokalemia - will add kcl to maintenance fluids - f/u mg  # AKI Prerenal, mild, resolved w/ fluids - continue gentle hydration  # Hypernatremia 2/2 dehydration, resolved w/ fluids - monitor  # Anemia of chronic disease Hgb 8s, stable - monitor  # Hx DVT - cont apixaban  # Malnutrition, severe - RD consult   DVT prophylaxis: home apixaban Code Status: full Family Communication: husband updated @ bedside 9/17  Level of care: Med-Surg Status is: Inpatient  Remains inpatient appropriate because:Inpatient level of care appropriate due to severity of illness  Dispo:  Patient From: Home  Planned Disposition: Home with home hospice  Medically stable for discharge: No          Consultants:  Oncology, palliative care  Procedures: none  Antimicrobials:  none    Subjective: No pain, no appetite, no nausea/vomiting. Wants to wear t shirt instead of gown.  Objective: Vitals:   07/28/21 0800 07/28/21 0900 07/28/21 0931 07/28/21 1157  BP: (!) 93/58 (!) 77/56 (!) 84/55 (!) 74/61  Pulse: (!) 107 85 88 92  Resp: 16   16  Temp: 98 F (36.7 C)   98.2 F (36.8 C)  TempSrc: Oral   Oral  SpO2:  91% 100%    Weight:      Height:        Intake/Output Summary (Last 24 hours) at 07/28/2021 1247 Last data filed at 07/28/2021 0903 Gross per 24 hour  Intake 1755.25 ml  Output 200 ml  Net 1555.25 ml   Filed Weights   07/26/21 1156 07/26/21 2028 07/27/21 1009  Weight: 39.3 kg 39.3 kg 41.7 kg    Examination:  General exam: Appears calm and  comfortable. Chronically ill appearing Respiratory system: Clear to auscultation. Respiratory effort normal. Cardiovascular system: S1 & S2 heard, RRR. No JVD, murmurs, rubs, gallops or clicks. No pedal edema. Gastrointestinal system: Abdomen is nondistended, soft and nontender. No organomegaly or masses felt. Normal bowel sounds heard. Central nervous system: Alert and oriented. No focal neurological deficits. Extremities: moves all 4 Skin: no lesions Psychiatry: Judgement and insight appear normal. Affect is flat    Data Reviewed: I have personally reviewed following labs and imaging studies  CBC: Recent Labs  Lab 07/26/21 1243 07/27/21 0446 07/28/21 0530  WBC 7.4 5.0 4.5  HGB 10.8* 8.3* 7.4*  HCT 34.2* 26.4* 24.0*  MCV 104.6* 106.0* 105.7*  PLT 459* 329 357   Basic Metabolic Panel: Recent Labs  Lab 07/26/21 1243 07/27/21 0446 07/28/21 0530  NA 148* 144 141  K 4.1 3.6 3.1*  CL 112* 117* 111  CO2 20* 21* 22  GLUCOSE 149* 109* 126*  BUN 43* 35* 21  CREATININE 1.34* 0.92 0.67  CALCIUM 9.8 8.6* 8.2*  MG  --  2.4  --   PHOS  --  3.1  --    GFR: Estimated Creatinine Clearance: 45.5 mL/min (by C-G formula based on SCr of 0.67 mg/dL). Liver Function Tests: Recent Labs  Lab 07/26/21 1243 07/27/21 0446 07/28/21 0530  AST 69* 53* 26  ALT 56* 50* 31  ALKPHOS 122 100 96  BILITOT 1.4* 1.0 0.7  PROT 8.1 6.4* 5.7*  ALBUMIN 3.6 2.9* 2.6*   Recent Labs  Lab 07/26/21 1243  LIPASE 52*   No results for input(s): AMMONIA in the last 168 hours. Coagulation Profile: No results for input(s): INR, PROTIME in the last 168 hours. Cardiac Enzymes: Recent Labs  Lab 07/26/21 1243  CKTOTAL 38   BNP (last 3 results) No results for input(s): PROBNP in the last 8760 hours. HbA1C: No results for input(s): HGBA1C in the last 72 hours. CBG: No results for input(s): GLUCAP in the last 168 hours. Lipid Profile: No results for input(s): CHOL, HDL, LDLCALC, TRIG, CHOLHDL,  LDLDIRECT in the last 72 hours. Thyroid Function Tests: Recent Labs    07/26/21 1243  TSH 12.621*  FREET4 1.05   Anemia Panel: No results for input(s): VITAMINB12, FOLATE, FERRITIN, TIBC, IRON, RETICCTPCT in the last 72 hours. Urine analysis:    Component Value Date/Time   COLORURINE YELLOW (A) 06/17/2021 1104   APPEARANCEUR HAZY (A) 06/17/2021 1104   LABSPEC 1.024 06/17/2021 1104   PHURINE 6.0 06/17/2021 1104   GLUCOSEU 150 (A) 06/17/2021 1104   HGBUR LARGE (A) 06/17/2021 1104   BILIRUBINUR NEGATIVE 06/17/2021 1104   KETONESUR NEGATIVE 06/17/2021 1104   PROTEINUR 30 (A) 06/17/2021 1104   NITRITE POSITIVE (A) 06/17/2021 1104   LEUKOCYTESUR NEGATIVE 06/17/2021 1104   Sepsis Labs: @LABRCNTIP (procalcitonin:4,lacticidven:4)  ) Recent Results (from the past 240 hour(s))  Resp Panel by RT-PCR (Flu A&B, Covid) Nasopharyngeal Swab     Status: None   Collection Time: 07/26/21  1:42 PM   Specimen: Nasopharyngeal Swab; Nasopharyngeal(NP) swabs in  vial transport medium  Result Value Ref Range Status   SARS Coronavirus 2 by RT PCR NEGATIVE NEGATIVE Final    Comment: (NOTE) SARS-CoV-2 target nucleic acids are NOT DETECTED.  The SARS-CoV-2 RNA is generally detectable in upper respiratory specimens during the acute phase of infection. The lowest concentration of SARS-CoV-2 viral copies this assay can detect is 138 copies/mL. A negative result does not preclude SARS-Cov-2 infection and should not be used as the sole basis for treatment or other patient management decisions. A negative result may occur with  improper specimen collection/handling, submission of specimen other than nasopharyngeal swab, presence of viral mutation(s) within the areas targeted by this assay, and inadequate number of viral copies(<138 copies/mL). A negative result must be combined with clinical observations, patient history, and epidemiological information. The expected result is Negative.  Fact Sheet for  Patients:  EntrepreneurPulse.com.au  Fact Sheet for Healthcare Providers:  IncredibleEmployment.be  This test is no t yet approved or cleared by the [place] FDA and  has been authorized for detection and/or diagnosis of SARS-CoV-2 by FDA under an Emergency Use Authorization (EUA). This EUA will remain  in effect (meaning this test can be used) for the duration of the COVID-19 declaration under Section 564(b)(1) of the Act, 21 U.S.C.section 360bbb-3(b)(1), unless the authorization is terminated  or revoked sooner.       Influenza A by PCR NEGATIVE NEGATIVE Final   Influenza B by PCR NEGATIVE NEGATIVE Final    Comment: (NOTE) The Xpert Xpress SARS-CoV-2/FLU/RSV plus assay is intended as an aid in the diagnosis of influenza from Nasopharyngeal swab specimens and should not be used as a sole basis for treatment. Nasal washings and aspirates are unacceptable for Xpert Xpress SARS-CoV-2/FLU/RSV testing.  Fact Sheet for Patients: EntrepreneurPulse.com.au  Fact Sheet for Healthcare Providers: IncredibleEmployment.be  This test is not yet approved or cleared by the [place] FDA and has been authorized for detection and/or diagnosis of SARS-CoV-2 by FDA under an Emergency Use Authorization (EUA). This EUA will remain in effect (meaning this test can be used) for the duration of the COVID-19 declaration under Section 564(b)(1) of the Act, 21 U.S.C. section 360bbb-3(b)(1), unless the authorization is terminated or revoked.  Performed at [facility], [address], [place], [place] [address]          Radiology Studies: CT HEAD WO CONTRAST (5MM)  Result Date: 07/26/2021 CLINICAL DATA:  Neurological deficit EXAM: CT HEAD WITHOUT CONTRAST TECHNIQUE: Contiguous axial images were obtained from the base of the skull through the vertex without intravenous contrast. COMPARISON:  MRI head dated  May 04, 2021 FINDINGS: Brain: Linear area of hypoattenuation involving the temporoparietal cortex associated calcification, likely treated metastatic disease. No new mass, evidence of acute infarct, hydrocephalus or extra-axial collection. Vascular: No hyperdense vessel or unexpected calcification. Skull: Fluid is seen in the right inner ear and mastoid airspaces. Negative for fracture or focal lesion. Sinuses/Orbits: Air-fluid level of the sphenoid sinus. Other: None. IMPRESSION: No acute intracranial abnormality. Air-fluid level of the sphenoid sinus, findings can be seen in the setting of acute sinusitis Electronically Signed   By: Yetta Glassman M.D.   On: 07/26/2021 14:51   CT CHEST ABDOMEN PELVIS W CONTRAST  Result Date: 07/27/2021 CLINICAL DATA:  Metastatic lung cancer restaging EXAM: CT CHEST, ABDOMEN, AND PELVIS WITH CONTRAST TECHNIQUE: Multidetector CT imaging of the chest, abdomen and pelvis was performed following the standard protocol during bolus administration of intravenous contrast. CONTRAST:  96mL OMNIPAQUE IOHEXOL 350  MG/ML SOLN, additional oral enteric contrast COMPARISON:  PET-CT, 05/03/2021, CT chest, 04/12/2021 FINDINGS: CT CHEST FINDINGS Cardiovascular: Right chest port catheter. Aortic atherosclerosis. Normal heart size. No pericardial effusion. Mediastinum/Nodes: Interval enlargement of bulky, matted mediastinal, right hilar, left supraclavicular, and left axillary lymph nodes, largest left axillary node measuring 2.8 x 2.3 cm, previously 2.2 x 1.3 cm (series 2, image 8), largest pretracheal/anterior mediastinal node measuring 2.6 x 2.3 cm, previously 1.1 x 0.9 cm (series 2, image 17). Thyroid gland, trachea, and esophagus demonstrate no significant findings. Lungs/Pleura: Mild centrilobular and paraseptal emphysema. Bulky right hilar mass is slightly increased in size, difficult to accurately measure due to the presence of adjacent lymphadenopathy and postobstructive airspace  disease, although approximately 6.0 x 4.9 cm, previously 4.0 x 3.9 cm when measured similarly (series 2, image 26). Interval increase in postobstructive consolidation of the right middle lobe, now complete. Significant mass effect on the right pulmonary artery and lobar branches, which narrows and nearly occludes the vessels. Spiculated, irregular nodules and opacities are increased in size and confluence compared to prior examination, largest nodule of the right lower lobe measuring 2.8 x 2.3 cm, previously 2.0 x 1.6 cm when measured similarly (series 5, image 62). There is new, somewhat geographic ground-glass airspace opacity of the right lung, particularly suprahilar right upper lobe (series 5, image 35), and additionally in the paramedian left upper lobe (series 5, image 32). Interlobular septal thickening throughout the right lung, increased compared to prior examination. No pleural effusion or pneumothorax. Musculoskeletal: No chest wall mass or suspicious bone lesions identified. CT ABDOMEN PELVIS FINDINGS Hepatobiliary: Unchanged hypodense lesion of the anterior midline liver, adjacent to the falciform ligament, not previously FDG PET avid and favored to reflect focal fatty deposition or perhaps a benign liver lesion such as a focal nodular hyperplasia (series 2, image 46). No gallstones, gallbladder wall thickening, or biliary dilatation. Pancreas: Unremarkable. No pancreatic ductal dilatation or surrounding inflammatory changes. Spleen: Normal in size without significant abnormality. Adrenals/Urinary Tract: Significant interval enlargement of bilateral adrenal metastases, left adrenal mass measuring 3.2 x 2.1 cm, previously 2.1 x 1.7 cm (series 2, image 49). New, mild left hydronephrosis and hydroureter, without obstructing etiology identified to the left ureterovesicular junction. Distended, somewhat thickened urinary bladder. Stomach/Bowel: Stomach is within normal limits. Appendix appears normal. No  evidence of bowel wall thickening, distention, or inflammatory changes. Vascular/Lymphatic: Aortic atherosclerosis. Interval enlargement of right external iliac lymph nodes, measuring up to 2.4 x 1.8 cm, previously 2.1 x 1.3 cm (series 2, image 91). Reproductive: No mass or other abnormality. Other: No abdominal wall hernia or abnormality. No abdominopelvic ascites. Musculoskeletal: No acute or significant osseous findings. IMPRESSION: 1. Bulky right hilar mass is slightly increased in size, difficult to accurately measure due to the presence of adjacent lymphadenopathy and postobstructive airspace disease. 2. Interval increase in postobstructive consolidation of the right middle lobe, now complete. Significant mass effect on the right pulmonary artery and lobar branches, which narrows and nearly occludes the vessels. 3. There is interlobular septal thickening throughout the right lung, consistent with lymphangitic malignancy and/or interstitial edema due to pulmonary venous outflow obstruction. 4. Spiculated, irregular nodules and opacities throughout the right lung are increased in size and confluence compared to prior examination. 5. Interval enlargement of bulky, matted mediastinal, right hilar, left supraclavicular, left axillary, and right inguinal lymph nodes. 6. Significant interval enlargement of bilateral adrenal metastases. 7. Constellation of findings is consistent with worsened primary lung malignancy and metastatic disease. 8. There is new,  somewhat geographic ground-glass airspace opacity of the right lung, particularly suprahilar right upper lobe, and additionally in the paramedian left upper lobe, likely developing radiation pneumonitis. 9. Distended, somewhat thickened urinary bladder, with new, mild left hydronephrosis and hydroureter, possibly due to back pressure. Correlate for urinary retention. Aortic Atherosclerosis (ICD10-I70.0). Electronically Signed   By: Eddie Candle M.D.   On: 07/27/2021  19:21   DG Chest Portable 1 View  Result Date: 07/26/2021 CLINICAL DATA:  Weakness, sepsis, metastatic lung cancer EXAM: PORTABLE CHEST 1 VIEW COMPARISON:  06/17/2021, 05/03/2021 FINDINGS: Single frontal view of the chest demonstrates right chest wall port via internal jugular approach tip overlying superior vena cava. The cardiac silhouette is unremarkable. There is increased consolidation within the right middle lobe, now obscuring the right hilar mass seen previously. Nodularity within the lateral right lung base consistent with metastatic disease observed on previous imaging. No effusion or pneumothorax. No acute bony abnormalities. IMPRESSION: 1. Increasing consolidation within the right middle lobe, obscuring the right hilar mass seen previously. This may reflect progression of disease and/or increasing postobstructive change. 2. Nodularity at the right lateral lung base consistent with metastatic disease seen previously. Electronically Signed   By: Randa Ngo M.D.   On: 07/26/2021 15:14        Scheduled Meds:  (feeding supplement) PROSource Plus  30 mL Oral BID BM   apixaban  5 mg Oral BID   Chlorhexidine Gluconate Cloth  6 each Topical Daily   feeding supplement  237 mL Oral TID BM   fluconazole  100 mg Oral Daily   multivitamin with minerals  1 tablet Oral Daily   Continuous Infusions:  dextrose 5 % and 0.45 % NaCl with KCl 20 mEq/L       LOS: 2 days    Time spent: 25 min    Desma Maxim, MD [facility]   If 7PM-7AM, please contact night-coverage [email or web address] Password [facility] 07/28/2021, 12:47 PM

## 2021-07-28 NOTE — Progress Notes (Signed)
   07/28/21 1157  Assess: MEWS Score  Temp 98.2 F (36.8 C) (V. Hilliard, RN)  BP (!) 74/61  Pulse Rate 92  Resp 16  O2 Device Room Air  Assess: MEWS Score  MEWS Temp 0  MEWS Systolic 2  MEWS Pulse 0  MEWS RR 0  MEWS LOC 0  MEWS Score 2  MEWS Score Color Yellow  Assess: if the MEWS score is Yellow or Red  Were vital signs taken at a resting state? Yes  Focused Assessment No change from prior assessment  Does the patient meet 2 or more of the SIRS criteria? No  Document  Progress note created (see row info) Yes  Assess: SIRS CRITERIA  SIRS Temperature  0  SIRS Pulse 1  SIRS Respirations  0  SIRS WBC 0  SIRS Score Sum  1  Will continue to monitor pt

## 2021-07-28 NOTE — Progress Notes (Signed)
   07/28/21 1640  Assess: MEWS Score  Temp 98.5 F (36.9 C)  BP 106/60  Pulse Rate 97  Resp 18  SpO2 100 %  O2 Device Room Air  Assess: MEWS Score  MEWS Temp 0  MEWS Systolic 0  MEWS Pulse 0  MEWS RR 0  MEWS LOC 0  MEWS Score 0  MEWS Score Color Green  Assess: if the MEWS score is Yellow or Red  Were vital signs taken at a resting state? Yes  Focused Assessment No change from prior assessment  Does the patient meet 2 or more of the SIRS criteria? No  Document  Progress note created (see row info) Yes  Assess: SIRS CRITERIA  SIRS Temperature  0  SIRS Pulse 1  SIRS Respirations  0  SIRS WBC 0  SIRS Score Sum  1  1700 Yellow MEWS VS; next q4hr VS due at 2100; will continue to monitor pt

## 2021-07-28 NOTE — Progress Notes (Signed)
   07/28/21 0900  Assess: MEWS Score  Temp  (V. Hilliard, RN)  BP (!) 77/56  Pulse Rate 85  SpO2 100 %  O2 Device Room Air  Assess: MEWS Score  MEWS Temp 0  MEWS Systolic 2  MEWS Pulse 0  MEWS RR 0  MEWS LOC 0  MEWS Score 2  MEWS Score Color Yellow  Assess: if the MEWS score is Yellow or Red  Were vital signs taken at a resting state? Yes  Focused Assessment No change from prior assessment  Does the patient meet 2 or more of the SIRS criteria? No  MEWS guidelines implemented *See Row Information* Yes  Treat  MEWS Interventions Escalated (See documentation below)  Take Vital Signs  Increase Vital Sign Frequency  Yellow: Q 2hr X 2 then Q 4hr X 2, if remains yellow, continue Q 4hrs  Escalate  MEWS: Escalate Yellow: discuss with charge nurse/RN and consider discussing with provider and RRT  Notify: Charge Nurse/RN  Name of Charge Nurse/RN Notified Amanda P.  Date Charge Nurse/RN Notified 07/28/21  Time Charge Nurse/RN Notified 0900  Document  Progress note created (see row info) Yes  Assess: SIRS CRITERIA  SIRS Temperature  0  SIRS Pulse 0  SIRS Respirations  0  SIRS WBC 0  SIRS Score Sum  0  VS rechecked by SN; still Yellow MEWS results to get in touch with Attending Physician, N. Wouk MD regarding BP; will continue to monitor pt

## 2021-07-28 NOTE — Progress Notes (Signed)
   07/28/21 1001  Notify: Provider  Provider Name/Title Laurey Arrow MD  Date Provider Notified 07/28/21  Time Provider Notified 1001  Notification Type  (Secure Chat)  Notification Reason Change in status  Possible treatment(s) pending response/new orders for pt; will continue to monitor pt

## 2021-07-28 NOTE — Progress Notes (Signed)
   07/28/21 0800 07/28/21 0900  Assess: MEWS Score  Temp 98 F (36.7 C) (V. Hilliard, RN)  (V. Hilliard, RN)  BP (!) 93/58 (!) 77/56  Pulse Rate (!) 107 85  Resp 16  --   SpO2 91 %  --   O2 Device Room Air  --   Assess: MEWS Score  MEWS Temp 0 0  MEWS Systolic 1 2  MEWS Pulse 1 0  MEWS RR 0 0  MEWS LOC 0 0  MEWS Score 2 2  MEWS Score Color Yellow Yellow  Assess: if the MEWS score is Yellow or Red  Were vital signs taken at a resting state? Yes (not the accurate time for the VS; taken by SN did not realize had to insert column for the exact time taken; unable to scan and allow VS to flow due to SN status; unaware of actual time VS taken)  --   Focused Assessment No change from prior assessment  --   Does the patient meet 2 or more of the SIRS criteria? No  --   MEWS guidelines implemented *See Row Information* No, vital signs rechecked (RN did not catch VS results until 0900)  --   Assess: SIRS CRITERIA  SIRS Temperature  0 0  SIRS Pulse 1 0  SIRS Respirations  0 0  SIRS WBC 0 0  SIRS Score Sum  1 0  SN that originally obtained VS did not know to notify the RN with the Yellow MEWS upon collection of the VS

## 2021-07-28 NOTE — Progress Notes (Signed)
   07/28/21 1029  Treat  Interventions Medication (see MAR)  Increased IV fluid rate from 75 to 125 ml/hr per Dr Marcia Brash order in [facility]

## 2021-07-28 NOTE — Progress Notes (Signed)
Hematology/Oncology Consult note [facility]  Telephone:([phone] [phone]  Patient Care Team: Cammie Sickle, MD as PCP - General (Internal Medicine) Telford Nab, RN as Oncology Nurse Navigator   Name of the patient: Cathy Glover  [id number]  08-06-1956   Date of visit: 07/28/2021   Interval history- reports ongoing fatigue and shortness of breath  ECOG PS- 4 Pain scale- 3 Opioid associated constipation- no  Review of systems- Review of Systems  Constitutional:  Positive for malaise/fatigue. Negative for chills, fever and weight loss.  HENT:  Negative for congestion, ear discharge and nosebleeds.   Eyes:  Negative for blurred vision.  Respiratory:  Positive for shortness of breath. Negative for cough, hemoptysis, sputum production and wheezing.   Cardiovascular:  Negative for chest pain, palpitations, orthopnea and claudication.  Gastrointestinal:  Negative for abdominal pain, blood in stool, constipation, diarrhea, heartburn, melena, nausea and vomiting.  Genitourinary:  Negative for dysuria, flank pain, frequency, hematuria and urgency.  Musculoskeletal:  Negative for back pain, joint pain and myalgias.  Skin:  Negative for rash.  Neurological:  Negative for dizziness, tingling, focal weakness, seizures, weakness and headaches.  Endo/Heme/Allergies:  Does not bruise/bleed easily.  Psychiatric/Behavioral:  Negative for depression and suicidal ideas. The patient does not have insomnia.       No Known Allergies   Past Medical History:  Diagnosis Date   Cancer ([place])    Pulmonary nodule      Past Surgical History:  Procedure Laterality Date   IR IMAGING GUIDED PORT INSERTION  05/11/2021   VIDEO BRONCHOSCOPY WITH ENDOBRONCHIAL ULTRASOUND N/A 04/25/2021   Procedure: VIDEO BRONCHOSCOPY WITH ENDOBRONCHIAL ULTRASOUND;  Surgeon: Tyler Pita, MD;  Location: [facility] ORS;  Service: Cardiopulmonary;  Laterality: N/A;     Social History   Socioeconomic History   Marital status: Married    Spouse name: Not on file   Number of children: Not on file   Years of education: Not on file   Highest education level: Not on file  Occupational History   Not on file  Tobacco Use   Smoking status: Former    Packs/day: 0.50    Years: 50.00    Pack years: 25.00    Types: Cigarettes    Quit date: 04/11/2021    Years since quitting: 0.2   Smokeless tobacco: Never   Tobacco comments:    Quit one month ago  Vaping Use   Vaping Use: Never used  Substance and Sexual Activity   Alcohol use: Never   Drug use: Never   Sexual activity: Not on file  Other Topics Concern   Not on file  Social History Narrative   Lives in [place] with husband; 2 grown sons; quit smoking- June 2022. No alcohol.    Social Determinants of Health   Financial Resource Strain: Not on file  Food Insecurity: Not on file  Transportation Needs: Not on file  Physical Activity: Not on file  Stress: Not on file  Social Connections: Not on file  Intimate Partner Violence: Not on file    Family History  Problem Relation Age of Onset   Cancer Sister      Current Facility-Administered Medications:    (feeding supplement) PROSource Plus liquid 30 mL, 30 mL, Oral, BID BM, Wouk, Ailene Rud, MD, 30 mL at 07/27/21 1514   albuterol (PROVENTIL) (2.5 MG/3ML) 0.083% nebulizer solution 3 mL, 3 mL, Inhalation, Q6H PRN, Irene Pap N, DO, 3 mL at 07/27/21 2114  apixaban (ELIQUIS) tablet 5 mg, 5 mg, Oral, BID, Hall, Carole N, DO, 5 mg at 07/28/21 2026   Chlorhexidine Gluconate Cloth 2 % PADS 6 each, 6 each, Topical, Daily, Hall, Carole N, DO, 6 each at 07/28/21 1100   dextrose 5 % and 0.45 % NaCl with KCl 20 mEq/L infusion, , Intravenous, Continuous, Wouk, Ailene Rud, MD, Last Rate: 125 mL/hr at 07/28/21 1345, New Bag at 07/28/21 1345   feeding supplement (ENSURE ENLIVE / ENSURE PLUS) liquid 237 mL, 237 mL, Oral, TID BM, Wouk, Ailene Rud, MD    fluconazole (DIFLUCAN) tablet 100 mg, 100 mg, Oral, Daily, Hall, Carole N, DO, 100 mg at 07/28/21 0908   guaiFENesin-dextromethorphan (ROBITUSSIN DM) 100-10 MG/5ML syrup 5 mL, 5 mL, Oral, Q4H PRN, Athena Masse, MD, 5 mL at 07/28/21 1803   ibuprofen (ADVIL) tablet 400 mg, 400 mg, Oral, Q6H PRN, Irene Pap N, DO   multivitamin with minerals tablet 1 tablet, 1 tablet, Oral, Daily, Wouk, Ailene Rud, MD, 1 tablet at 07/28/21 0907  Physical exam:  Vitals:   07/28/21 0931 07/28/21 1157 07/28/21 1640 07/28/21 2112  BP: (!) 84/55 (!) 74/61 106/60 117/88  Pulse: 88 92 97 94  Resp:  16 18 18   Temp:  98.2 F (36.8 C) 98.5 F (36.9 C) 99 F (37.2 C)  TempSrc:  Oral Oral Oral  SpO2:   100% 97%  Weight:      Height:       Physical Exam Constitutional:      Comments: Appears quite frail and fatigued  Cardiovascular:     Rate and Rhythm: Normal rate and regular rhythm.     Heart sounds: Normal heart sounds.  Pulmonary:     Breath sounds: Normal breath sounds.     Comments: Effort of breathing increased Skin:    General: Skin is warm and dry.  Neurological:     Mental Status: She is alert and oriented to person, place, and time.     CMP Latest Ref Rng & Units 07/28/2021  Glucose 70 - 99 mg/dL 126(H)  BUN 8 - 23 mg/dL 21  Creatinine 0.44 - 1.00 mg/dL 0.67  Sodium 135 - 145 mmol/L 141  Potassium 3.5 - 5.1 mmol/L 3.1(L)  Chloride 98 - 111 mmol/L 111  CO2 22 - 32 mmol/L 22  Calcium 8.9 - 10.3 mg/dL 8.2(L)  Total Protein 6.5 - 8.1 g/dL 5.7(L)  Total Bilirubin 0.3 - 1.2 mg/dL 0.7  Alkaline Phos 38 - 126 U/L 96  AST 15 - 41 U/L 26  ALT 0 - 44 U/L 31   CBC Latest Ref Rng & Units 07/28/2021  WBC 4.0 - 10.5 K/uL 4.5  Hemoglobin 12.0 - 15.0 g/dL 7.4(L)  Hematocrit 36.0 - 46.0 % 24.0(L)  Platelets 150 - 400 K/uL 301    @IMAGES @  CT HEAD WO CONTRAST (5MM)  Result Date: 07/26/2021 CLINICAL DATA:  Neurological deficit EXAM: CT HEAD WITHOUT CONTRAST TECHNIQUE: Contiguous axial images  were obtained from the base of the skull through the vertex without intravenous contrast. COMPARISON:  MRI head dated May 04, 2021 FINDINGS: Brain: Linear area of hypoattenuation involving the temporoparietal cortex associated calcification, likely treated metastatic disease. No new mass, evidence of acute infarct, hydrocephalus or extra-axial collection. Vascular: No hyperdense vessel or unexpected calcification. Skull: Fluid is seen in the right inner ear and mastoid airspaces. Negative for fracture or focal lesion. Sinuses/Orbits: Air-fluid level of the sphenoid sinus. Other: None. IMPRESSION: No acute intracranial abnormality. Air-fluid level of  the sphenoid sinus, findings can be seen in the setting of acute sinusitis Electronically Signed   By: Yetta Glassman M.D.   On: 07/26/2021 14:51   CT CHEST ABDOMEN PELVIS W CONTRAST  Result Date: 07/27/2021 CLINICAL DATA:  Metastatic lung cancer restaging EXAM: CT CHEST, ABDOMEN, AND PELVIS WITH CONTRAST TECHNIQUE: Multidetector CT imaging of the chest, abdomen and pelvis was performed following the standard protocol during bolus administration of intravenous contrast. CONTRAST:  83mL OMNIPAQUE IOHEXOL 350 MG/ML SOLN, additional oral enteric contrast COMPARISON:  PET-CT, 05/03/2021, CT chest, 04/12/2021 FINDINGS: CT CHEST FINDINGS Cardiovascular: Right chest port catheter. Aortic atherosclerosis. Normal heart size. No pericardial effusion. Mediastinum/Nodes: Interval enlargement of bulky, matted mediastinal, right hilar, left supraclavicular, and left axillary lymph nodes, largest left axillary node measuring 2.8 x 2.3 cm, previously 2.2 x 1.3 cm (series 2, image 8), largest pretracheal/anterior mediastinal node measuring 2.6 x 2.3 cm, previously 1.1 x 0.9 cm (series 2, image 17). Thyroid gland, trachea, and esophagus demonstrate no significant findings. Lungs/Pleura: Mild centrilobular and paraseptal emphysema. Bulky right hilar mass is slightly increased in  size, difficult to accurately measure due to the presence of adjacent lymphadenopathy and postobstructive airspace disease, although approximately 6.0 x 4.9 cm, previously 4.0 x 3.9 cm when measured similarly (series 2, image 26). Interval increase in postobstructive consolidation of the right middle lobe, now complete. Significant mass effect on the right pulmonary artery and lobar branches, which narrows and nearly occludes the vessels. Spiculated, irregular nodules and opacities are increased in size and confluence compared to prior examination, largest nodule of the right lower lobe measuring 2.8 x 2.3 cm, previously 2.0 x 1.6 cm when measured similarly (series 5, image 62). There is new, somewhat geographic ground-glass airspace opacity of the right lung, particularly suprahilar right upper lobe (series 5, image 35), and additionally in the paramedian left upper lobe (series 5, image 32). Interlobular septal thickening throughout the right lung, increased compared to prior examination. No pleural effusion or pneumothorax. Musculoskeletal: No chest wall mass or suspicious bone lesions identified. CT ABDOMEN PELVIS FINDINGS Hepatobiliary: Unchanged hypodense lesion of the anterior midline liver, adjacent to the falciform ligament, not previously FDG PET avid and favored to reflect focal fatty deposition or perhaps a benign liver lesion such as a focal nodular hyperplasia (series 2, image 46). No gallstones, gallbladder wall thickening, or biliary dilatation. Pancreas: Unremarkable. No pancreatic ductal dilatation or surrounding inflammatory changes. Spleen: Normal in size without significant abnormality. Adrenals/Urinary Tract: Significant interval enlargement of bilateral adrenal metastases, left adrenal mass measuring 3.2 x 2.1 cm, previously 2.1 x 1.7 cm (series 2, image 49). New, mild left hydronephrosis and hydroureter, without obstructing etiology identified to the left ureterovesicular junction. Distended,  somewhat thickened urinary bladder. Stomach/Bowel: Stomach is within normal limits. Appendix appears normal. No evidence of bowel wall thickening, distention, or inflammatory changes. Vascular/Lymphatic: Aortic atherosclerosis. Interval enlargement of right external iliac lymph nodes, measuring up to 2.4 x 1.8 cm, previously 2.1 x 1.3 cm (series 2, image 91). Reproductive: No mass or other abnormality. Other: No abdominal wall hernia or abnormality. No abdominopelvic ascites. Musculoskeletal: No acute or significant osseous findings. IMPRESSION: 1. Bulky right hilar mass is slightly increased in size, difficult to accurately measure due to the presence of adjacent lymphadenopathy and postobstructive airspace disease. 2. Interval increase in postobstructive consolidation of the right middle lobe, now complete. Significant mass effect on the right pulmonary artery and lobar branches, which narrows and nearly occludes the vessels. 3. There is interlobular septal  thickening throughout the right lung, consistent with lymphangitic malignancy and/or interstitial edema due to pulmonary venous outflow obstruction. 4. Spiculated, irregular nodules and opacities throughout the right lung are increased in size and confluence compared to prior examination. 5. Interval enlargement of bulky, matted mediastinal, right hilar, left supraclavicular, left axillary, and right inguinal lymph nodes. 6. Significant interval enlargement of bilateral adrenal metastases. 7. Constellation of findings is consistent with worsened primary lung malignancy and metastatic disease. 8. There is new, somewhat geographic ground-glass airspace opacity of the right lung, particularly suprahilar right upper lobe, and additionally in the paramedian left upper lobe, likely developing radiation pneumonitis. 9. Distended, somewhat thickened urinary bladder, with new, mild left hydronephrosis and hydroureter, possibly due to back pressure. Correlate for urinary  retention. Aortic Atherosclerosis (ICD10-I70.0). Electronically Signed   By: Eddie Candle M.D.   On: 07/27/2021 19:21   DG Chest Portable 1 View  Result Date: 07/26/2021 CLINICAL DATA:  Weakness, sepsis, metastatic lung cancer EXAM: PORTABLE CHEST 1 VIEW COMPARISON:  06/17/2021, 05/03/2021 FINDINGS: Single frontal view of the chest demonstrates right chest wall port via internal jugular approach tip overlying superior vena cava. The cardiac silhouette is unremarkable. There is increased consolidation within the right middle lobe, now obscuring the right hilar mass seen previously. Nodularity within the lateral right lung base consistent with metastatic disease observed on previous imaging. No effusion or pneumothorax. No acute bony abnormalities. IMPRESSION: 1. Increasing consolidation within the right middle lobe, obscuring the right hilar mass seen previously. This may reflect progression of disease and/or increasing postobstructive change. 2. Nodularity at the right lateral lung base consistent with metastatic disease seen previously. Electronically Signed   By: Randa Ngo M.D.   On: 07/26/2021 15:14     Assessment and plan- Patient is a 65 y.o. female with primary refractory metastatic small cell lung cancer admitted for generalized weakness and failure to thrive  I have reviewed CT chest abdomen pelvis images independently and discussed findings with patient and her husband.  Patient diagnosed with extensive stage small cell lung cancer and has received 2 cycles of carbo etoposide Tecentriq chemotherapy so far.  She has tolerated chemotherapy poorly and is required hospitalization for neutropenic fever.  Scans now done after 2 cycles of chemotherapy show significant progression of disease.  Discussed overall prognosis with primary refractory metastatic small cell lung cancer is poor.  Her baseline performance status is between 3-4 and her overall prognosis is likely in weeks.  She is not a  candidate for second line systemic chemotherapy.  Single agent immunotherapy is unlikely to give any significant benefit.  We discussed focusing on her quality of life and comfort and transitioning to home hospice.  Patient and her husband seem to be open to the idea of pursuing this option.  Their goal is to go home and her husband is willing to provide 24-hour care for her.  He will be in need for durable medical equipment which can be coordinated by hospice.  I discussed briefly what hospice services entail and that hospice can be provided at home or nursing home or if there is a significant decline in her performance status that the family is unable to manage at home or increase in symptom burden hospice home can be considered as well.   Currently patient is a full code but we did discuss CODE STATUS as well and she is leaning towards becoming a DNR/DNI.  Patient is currently on ibuprofen every 6 hours as needed for pain and  I would recommend that it should be stopped and patient should be given opioids instead especially since she is also on Eliquis and therefore at high risk of GI bleeding with concurrent use of NSAIDs.   Visit Diagnosis 1. AKI (acute kidney injury) ([place])   2. Dehydration   3. Hypernatremia   4. Lung cancer [facility])      Dr. Randa Evens, MD, MPH [facility] at [facility] [phone] 07/28/2021 9:27 PM

## 2021-07-28 NOTE — Progress Notes (Signed)
   07/28/21 1345  Treat  Interventions Medication (see MAR)  IVF changed to D5 1/2 NS with 72mEq KCl at 125 ml/hr per [facility] order by Dr Delane Ginger. Wouk MD; pt's serum potassium with am lab results = 3.1, will continue to monitor pt

## 2021-07-29 DIAGNOSIS — R627 Adult failure to thrive: Secondary | ICD-10-CM | POA: Diagnosis not present

## 2021-07-29 LAB — CBC
HCT: 24.8 % — ABNORMAL LOW (ref 36.0–46.0)
Hemoglobin: 7.7 g/dL — ABNORMAL LOW (ref 12.0–15.0)
MCH: 32.2 pg (ref 26.0–34.0)
MCHC: 31 g/dL (ref 30.0–36.0)
MCV: 103.8 fL — ABNORMAL HIGH (ref 80.0–100.0)
Platelets: 292 10*3/uL (ref 150–400)
RBC: 2.39 MIL/uL — ABNORMAL LOW (ref 3.87–5.11)
RDW: 17.3 % — ABNORMAL HIGH (ref 11.5–15.5)
WBC: 4.9 10*3/uL (ref 4.0–10.5)
nRBC: 0 % (ref 0.0–0.2)

## 2021-07-29 LAB — BASIC METABOLIC PANEL
Anion gap: 8 (ref 5–15)
BUN: 10 mg/dL (ref 8–23)
CO2: 19 mmol/L — ABNORMAL LOW (ref 22–32)
Calcium: 8.1 mg/dL — ABNORMAL LOW (ref 8.9–10.3)
Chloride: 110 mmol/L (ref 98–111)
Creatinine, Ser: 0.72 mg/dL (ref 0.44–1.00)
GFR, Estimated: >60 mL/min (ref >60)
Glucose, Bld: 125 mg/dL — ABNORMAL HIGH (ref 70–99)
Potassium: 3.5 mmol/L (ref 3.5–5.1)
Sodium: 137 mmol/L (ref 135–145)

## 2021-07-29 LAB — MAGNESIUM: Magnesium: 1.9 mg/dL (ref 1.7–2.4)

## 2021-07-29 MED ORDER — ACETAMINOPHEN 500 MG PO TABS: 1000.0000 mg | ORAL_TABLET | Freq: Four times a day (QID) | ORAL | Status: DC | PRN

## 2021-07-29 MED ORDER — POTASSIUM CHLORIDE CRYS ER 20 MEQ PO TBCR
40.0000 meq | EXTENDED_RELEASE_TABLET | Freq: Once | ORAL | Status: AC
  Administered 2021-07-29: 40 meq via ORAL
  Filled 2021-07-29: qty 2

## 2021-07-29 NOTE — TOC Initial Note (Signed)
Transition of Care [facility]) - Initial/Assessment Note    Patient Details  Name: Cathy Glover MRN: [id number] Date of Birth: 04/24/1956  Transition of Care [facility]) CM/SW Contact:    Harriet Masson, RN Phone Number:[phone] 07/29/2021, 2:59 PM  Clinical Narrative:                 Spoke with pt and spouse Cathy Glover) at bedside today. Spouse indicates pt has been referral for hospice in the home. States the hospice liaison consult is scheduled for tomorrow. Spouse indicates he is the primary caregiver for this pt and continues to work. Lives in a single family home with spouse only and he provides all transportation and obtains all medications when needed. He is unable to provide the recommended 24/7 supervision for the pt at this time due to his work schedule.   TOC will continue to follow up accordingly.  Expected Discharge Plan: Home w Hospice Care Barriers to Discharge: Continued Medical Work up   Patient Goals and CMS Choice        Expected Discharge Plan and Services Expected Discharge Plan: [place]                                              Prior Living Arrangements/Services   Lives with:: Spouse Patient language and need for interpreter reviewed:: No Do you feel safe going back to the place where you live?: Yes      Need for Family Participation in Patient Care: Yes (Comment) Care giver support system in place?: Yes (comment)   Criminal Activity/Legal Involvement Pertinent to Current Situation/Hospitalization: No - Comment as needed  Activities of Daily Living Home Assistive Devices/Equipment: Walker (specify type) ADL Screening (condition at time of admission) Patient's cognitive ability adequate to safely complete daily activities?: No Is the patient deaf or have difficulty hearing?: No Does the patient have difficulty seeing, even when wearing glasses/contacts?: No Does the patient have difficulty concentrating, remembering, or  making decisions?: Yes Patient able to express need for assistance with ADLs?: Yes Does the patient have difficulty dressing or bathing?: Yes Independently performs ADLs?: No Does the patient have difficulty walking or climbing stairs?: Yes Weakness of Legs: Both Weakness of Arms/Hands: None  Permission Sought/Granted   Permission granted to share information with : Yes, Verbal Permission Granted  Share Information with NAME: Adali Pennings     Permission granted to share info w Relationship: spouse     Emotional Assessment Appearance:: Appears older than stated age Attitude/Demeanor/Rapport: Engaged Affect (typically observed): Accepting Orientation: : Oriented to Self, Oriented to Place, Oriented to Situation Alcohol / Substance Use: Not Applicable Psych Involvement: No (comment)  Admission diagnosis:  Dehydration [E86.0] Hypernatremia [E87.0] AKI (acute kidney injury) ([place]) [N17.9] Patient Active Problem List   Diagnosis Date Noted   History of DVT in adulthood 07/27/2021   Failure to thrive in adult 07/27/2021   Protein-calorie malnutrition, severe 07/27/2021   AKI (acute kidney injury) ([place]) 07/26/2021   Neutropenic sepsis ([place]) 06/17/2021   Hyponatremia 06/17/2021   Hypokalemia 06/17/2021   Antineoplastic chemotherapy induced pancytopenia ([place]) 06/17/2021   DVT (deep venous thrombosis) ([place]) 06/17/2021   Cellulitis of periorbital region of both eyes 06/17/2021   Goals of care, counseling/discussion 05/10/2021   Primary cancer of right lower lobe of lung ([place]) 05/01/2021   PCP:  Cammie Sickle, MD Pharmacy:  [facility] [place], [place] [facility] [address] AT [place] [address] [facility] [place] [address] Phone: [phone] Fax: [phone]     Social Determinants of Health (SDOH) Interventions    Readmission Risk Interventions Readmission Risk Prevention Plan 06/19/2021  Transportation Screening Complete  PCP or  Specialist Appt within 3-5 Days Complete  HRI or [place] Complete  Social Work Consult for [place] Planning/Counseling Complete  Palliative Care Screening Not Applicable  Medication Review Press photographer) Complete

## 2021-07-29 NOTE — Progress Notes (Signed)
PROGRESS NOTE    Cathy Glover  LFY:[id number] DOB: 1956/04/23 DOA: 07/26/2021 PCP: Cammie Sickle, MD  Outpatient Specialists: oncology    Brief Narrative:   From admission hpi Cathy Glover is a 65 y.o. 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  She has not eaten for more than a week.  She has had small sips of liquids intake.  Denies odynophagia but sometimes feels like food gets stuck in her throat.  Due to concern for dehydration, her husband contacted her oncologist for assistance. A month ago, her chemotherapy/immunotherapy was discontinued due to neutropenic sepsis and declining performance status.  Patient has been off chemotherapy for over a month.  She was recently admitted for Pseudomonas bacteremia and hospitalized between 06/17/2021 until 06/21/2021 with neutropenic sepsis.  She last saw her oncologist Dr. Lynett Fish on 05/03/2021 with plan to repeat CT scans on 08/02/2021.  She is mostly in the bed and becomes quite short of breath with minimal exertion.  Reports a persistent non productive cough.     Assessment & Plan:   Active Problems:   AKI (acute kidney injury) ([place])   History of DVT in adulthood   Failure to thrive in adult   Protein-calorie malnutrition, severe  # Metastatic lung cancer # Failure to thrive Recent admit for neutropenia and pseudomonas bacteremia. Chemo on hold. Prognosis poor. CT c/a/p shows worsening disease. No viable tx options per onc. - pt desires to transition to DNR status, ordered - patient and husband interested in hospice at home, have  contacted [facility] who will contact family, liaison to see family 9/19 - palliative following, will likely transition to comfort care over the next 1-2 days.   # Decreased appetite # Possible oral candidiasis No dysphagia but dark plaques on tongue. Overall this all likely 2/2 her advanced cancer, but reasonable to treat for candida - continue home fluconazole - SLP and RD following  # Hypotension Mental status unchanged - continue hydration  # Hypokalemia Resolved w/ adding kcl to maintenance fluids, will d/c those  # AKI Prerenal, mild, resolved w/ fluids - d/c fluids  # Hypernatremia 2/2 dehydration, resolved w/ fluids - monitor  # Anemia of chronic disease Hgb 8s, now 7.7, stable - monitor  # Hx DVT - cont apixaban  # Malnutrition, severe - RD consult   DVT prophylaxis: home apixaban Code Status: full Family Communication: husband updated @ bedside 9/18  Level of care: Med-Surg Status is: Inpatient  Remains inpatient appropriate because:Inpatient level of care appropriate due to severity of illness  Dispo:  Patient From: Home  Planned Disposition: Home with home hospice  Medically stable for discharge: No          Consultants:  Oncology, palliative care  Procedures: none  Antimicrobials:  none    Subjective: No pain, no appetite, no nausea/vomiting.  Objective: Vitals:   07/29/21 0226 07/29/21 0628 07/29/21 0644 07/29/21 0737  BP: 115/61 107/67  112/75  Pulse: 94 76 (!) 102 91  Resp: 18 16  16   Temp: 98.6 F (37 C) 98.9 F (37.2 C)  98.3 F (36.8 C)  TempSrc:  Oral    SpO2: 100% (!) 87%  99% 98%  Weight:      Height:        Intake/Output Summary (Last 24 hours) at 07/29/2021 1120 Last data filed at 07/29/2021 0910 Gross per 24 hour  Intake 2503.2 ml  Output 700 ml  Net 1803.2 ml   Filed Weights   07/26/21 1156 07/26/21 2028 07/27/21 1009  Weight: 39.3 kg 39.3 kg 41.7 kg    Examination:  General exam: Appears calm and  comfortable. Chronically ill appearing Respiratory system: Clear to auscultation. Respiratory effort normal. Cardiovascular system: S1 & S2 heard, RRR. No JVD, murmurs, rubs, gallops or clicks. No pedal edema. Gastrointestinal system: Abdomen is nondistended, soft and nontender. No organomegaly or masses felt. Normal bowel sounds heard. Central nervous system: Alert and oriented. No focal neurological deficits. Extremities: moves all 4 Skin: no lesions Psychiatry: Judgement and insight appear normal. Affect is flat    Data Reviewed: I have personally reviewed following labs and imaging studies  CBC: Recent Labs  Lab 07/26/21 1243 07/27/21 0446 07/28/21 0530 07/29/21 0531  WBC 7.4 5.0 4.5 4.9  HGB 10.8* 8.3* 7.4* 7.7*  HCT 34.2* 26.4* 24.0* 24.8*  MCV 104.6* 106.0* 105.7* 103.8*  PLT 459* 329 301 937   Basic Metabolic Panel: Recent Labs  Lab 07/26/21 1243 07/27/21 0446 07/28/21 0530 07/28/21 1556 07/29/21 0531  NA 148* 144 141  --  137  K 4.1 3.6 3.1*  --  3.5  CL 112* 117* 111  --  110  CO2 20* 21* 22  --  19*  GLUCOSE 149* 109* 126*  --  125*  BUN 43* 35* 21  --  10  CREATININE 1.34* 0.92 0.67  --  0.72  CALCIUM 9.8 8.6* 8.2*  --  8.1*  MG  --  2.4  --  1.9 1.9  PHOS  --  3.1  --   --   --    GFR: Estimated Creatinine Clearance: 45.5 mL/min (by C-G formula based on SCr of 0.72 mg/dL). Liver Function Tests: Recent Labs  Lab 07/26/21 1243 07/27/21 0446 07/28/21 0530  AST 69* 53* 26  ALT 56* 50* 31  ALKPHOS 122 100 96  BILITOT 1.4* 1.0 0.7  PROT 8.1 6.4* 5.7*  ALBUMIN 3.6 2.9* 2.6*   Recent Labs  Lab 07/26/21 1243  LIPASE 52*   No results for input(s): AMMONIA in the last 168 hours. Coagulation Profile: No results for input(s): INR, PROTIME in the last 168 hours. Cardiac Enzymes: Recent Labs  Lab 07/26/21 1243  CKTOTAL 38   BNP (last 3 results) No results for input(s): PROBNP in the last 8760 hours. HbA1C: No results for input(s): HGBA1C in  the last 72 hours. CBG: No results for input(s): GLUCAP in the last 168 hours. Lipid Profile: No results for input(s): CHOL, HDL, LDLCALC, TRIG, CHOLHDL, LDLDIRECT in the last 72 hours. Thyroid Function Tests: Recent Labs    07/26/21 1243  TSH 12.621*  FREET4 1.05   Anemia Panel: No results for input(s): VITAMINB12, FOLATE, FERRITIN, TIBC, IRON, RETICCTPCT in the last 72 hours. Urine analysis:    Component Value Date/Time   COLORURINE YELLOW (A) 06/17/2021 1104   APPEARANCEUR HAZY (A) 06/17/2021 1104   LABSPEC 1.024 06/17/2021 1104   PHURINE 6.0 06/17/2021 1104   GLUCOSEU 150 (A) 06/17/2021 1104   HGBUR LARGE (A) 06/17/2021 1104   BILIRUBINUR NEGATIVE 06/17/2021 1104   KETONESUR NEGATIVE 06/17/2021 1104   PROTEINUR 30 (A) 06/17/2021 1104   NITRITE POSITIVE (A) 06/17/2021 1104   LEUKOCYTESUR  NEGATIVE 06/17/2021 1104   Sepsis Labs: @LABRCNTIP (procalcitonin:4,lacticidven:4)  ) Recent Results (from the past 240 hour(s))  Resp Panel by RT-PCR (Flu A&B, Covid) Nasopharyngeal Swab     Status: None   Collection Time: 07/26/21  1:42 PM   Specimen: Nasopharyngeal Swab; Nasopharyngeal(NP) swabs in vial transport medium  Result Value Ref Range Status   SARS Coronavirus 2 by RT PCR NEGATIVE NEGATIVE Final    Comment: (NOTE) SARS-CoV-2 target nucleic acids are NOT DETECTED.  The SARS-CoV-2 RNA is generally detectable in upper respiratory specimens during the acute phase of infection. The lowest concentration of SARS-CoV-2 viral copies this assay can detect is 138 copies/mL. A negative result does not preclude SARS-Cov-2 infection and should not be used as the sole basis for treatment or other patient management decisions. A negative result may occur with  improper specimen collection/handling, submission of specimen other than nasopharyngeal swab, presence of viral mutation(s) within the areas targeted by this assay, and inadequate number of viral copies(<138 copies/mL). A  negative result must be combined with clinical observations, patient history, and epidemiological information. The expected result is Negative.  Fact Sheet for Patients:  EntrepreneurPulse.com.au  Fact Sheet for Healthcare Providers:  IncredibleEmployment.be  This test is no t yet approved or cleared by the [place] FDA and  has been authorized for detection and/or diagnosis of SARS-CoV-2 by FDA under an Emergency Use Authorization (EUA). This EUA will remain  in effect (meaning this test can be used) for the duration of the COVID-19 declaration under Section 564(b)(1) of the Act, 21 U.S.C.section 360bbb-3(b)(1), unless the authorization is terminated  or revoked sooner.       Influenza A by PCR NEGATIVE NEGATIVE Final   Influenza B by PCR NEGATIVE NEGATIVE Final    Comment: (NOTE) The Xpert Xpress SARS-CoV-2/FLU/RSV plus assay is intended as an aid in the diagnosis of influenza from Nasopharyngeal swab specimens and should not be used as a sole basis for treatment. Nasal washings and aspirates are unacceptable for Xpert Xpress SARS-CoV-2/FLU/RSV testing.  Fact Sheet for Patients: EntrepreneurPulse.com.au  Fact Sheet for Healthcare Providers: IncredibleEmployment.be  This test is not yet approved or cleared by the [place] FDA and has been authorized for detection and/or diagnosis of SARS-CoV-2 by FDA under an Emergency Use Authorization (EUA). This EUA will remain in effect (meaning this test can be used) for the duration of the COVID-19 declaration under Section 564(b)(1) of the Act, 21 U.S.C. section 360bbb-3(b)(1), unless the authorization is terminated or revoked.  Performed at [facility], [address] [place], [place] [address]          Radiology Studies: CT CHEST ABDOMEN PELVIS W CONTRAST  Result Date: 07/27/2021 CLINICAL DATA:  Metastatic lung cancer restaging  EXAM: CT CHEST, ABDOMEN, AND PELVIS WITH CONTRAST TECHNIQUE: Multidetector CT imaging of the chest, abdomen and pelvis was performed following the standard protocol during bolus administration of intravenous contrast. CONTRAST:  70mL OMNIPAQUE IOHEXOL 350 MG/ML SOLN, additional oral enteric contrast COMPARISON:  PET-CT, 05/03/2021, CT chest, 04/12/2021 FINDINGS: CT CHEST FINDINGS Cardiovascular: Right chest port catheter. Aortic atherosclerosis. Normal heart size. No pericardial effusion. Mediastinum/Nodes: Interval enlargement of bulky, matted mediastinal, right hilar, left supraclavicular, and left axillary lymph nodes, largest left axillary node measuring 2.8 x 2.3 cm, previously 2.2 x 1.3 cm (series 2, image 8), largest pretracheal/anterior mediastinal node measuring 2.6 x 2.3 cm, previously 1.1 x 0.9 cm (series 2, image 17). Thyroid gland, trachea, and esophagus demonstrate no significant findings. Lungs/Pleura: Mild centrilobular  and paraseptal emphysema. Bulky right hilar mass is slightly increased in size, difficult to accurately measure due to the presence of adjacent lymphadenopathy and postobstructive airspace disease, although approximately 6.0 x 4.9 cm, previously 4.0 x 3.9 cm when measured similarly (series 2, image 26). Interval increase in postobstructive consolidation of the right middle lobe, now complete. Significant mass effect on the right pulmonary artery and lobar branches, which narrows and nearly occludes the vessels. Spiculated, irregular nodules and opacities are increased in size and confluence compared to prior examination, largest nodule of the right lower lobe measuring 2.8 x 2.3 cm, previously 2.0 x 1.6 cm when measured similarly (series 5, image 62). There is new, somewhat geographic ground-glass airspace opacity of the right lung, particularly suprahilar right upper lobe (series 5, image 35), and additionally in the paramedian left upper lobe (series 5, image 32). Interlobular  septal thickening throughout the right lung, increased compared to prior examination. No pleural effusion or pneumothorax. Musculoskeletal: No chest wall mass or suspicious bone lesions identified. CT ABDOMEN PELVIS FINDINGS Hepatobiliary: Unchanged hypodense lesion of the anterior midline liver, adjacent to the falciform ligament, not previously FDG PET avid and favored to reflect focal fatty deposition or perhaps a benign liver lesion such as a focal nodular hyperplasia (series 2, image 46). No gallstones, gallbladder wall thickening, or biliary dilatation. Pancreas: Unremarkable. No pancreatic ductal dilatation or surrounding inflammatory changes. Spleen: Normal in size without significant abnormality. Adrenals/Urinary Tract: Significant interval enlargement of bilateral adrenal metastases, left adrenal mass measuring 3.2 x 2.1 cm, previously 2.1 x 1.7 cm (series 2, image 49). New, mild left hydronephrosis and hydroureter, without obstructing etiology identified to the left ureterovesicular junction. Distended, somewhat thickened urinary bladder. Stomach/Bowel: Stomach is within normal limits. Appendix appears normal. No evidence of bowel wall thickening, distention, or inflammatory changes. Vascular/Lymphatic: Aortic atherosclerosis. Interval enlargement of right external iliac lymph nodes, measuring up to 2.4 x 1.8 cm, previously 2.1 x 1.3 cm (series 2, image 91). Reproductive: No mass or other abnormality. Other: No abdominal wall hernia or abnormality. No abdominopelvic ascites. Musculoskeletal: No acute or significant osseous findings. IMPRESSION: 1. Bulky right hilar mass is slightly increased in size, difficult to accurately measure due to the presence of adjacent lymphadenopathy and postobstructive airspace disease. 2. Interval increase in postobstructive consolidation of the right middle lobe, now complete. Significant mass effect on the right pulmonary artery and lobar branches, which narrows and nearly  occludes the vessels. 3. There is interlobular septal thickening throughout the right lung, consistent with lymphangitic malignancy and/or interstitial edema due to pulmonary venous outflow obstruction. 4. Spiculated, irregular nodules and opacities throughout the right lung are increased in size and confluence compared to prior examination. 5. Interval enlargement of bulky, matted mediastinal, right hilar, left supraclavicular, left axillary, and right inguinal lymph nodes. 6. Significant interval enlargement of bilateral adrenal metastases. 7. Constellation of findings is consistent with worsened primary lung malignancy and metastatic disease. 8. There is new, somewhat geographic ground-glass airspace opacity of the right lung, particularly suprahilar right upper lobe, and additionally in the paramedian left upper lobe, likely developing radiation pneumonitis. 9. Distended, somewhat thickened urinary bladder, with new, mild left hydronephrosis and hydroureter, possibly due to back pressure. Correlate for urinary retention. Aortic Atherosclerosis (ICD10-I70.0). Electronically Signed   By: Eddie Candle M.D.   On: 07/27/2021 19:21        Scheduled Meds:  (feeding supplement) PROSource Plus  30 mL Oral BID BM   apixaban  5 mg Oral BID  Chlorhexidine Gluconate Cloth  6 each Topical Daily   feeding supplement  237 mL Oral TID BM   fluconazole  100 mg Oral Daily   multivitamin with minerals  1 tablet Oral Daily   Continuous Infusions:  dextrose 5 % and 0.45 % NaCl with KCl 20 mEq/L 125 mL/hr at 07/28/21 2209     LOS: 3 days    Time spent: 20 min    Desma Maxim, MD [facility]   If 7PM-7AM, please contact night-coverage [email or web address] Password [facility] 07/29/2021, 11:20 AM

## 2021-07-30 DIAGNOSIS — C7971 Secondary malignant neoplasm of right adrenal gland: Secondary | ICD-10-CM | POA: Diagnosis not present

## 2021-07-30 DIAGNOSIS — C7931 Secondary malignant neoplasm of brain: Secondary | ICD-10-CM | POA: Diagnosis not present

## 2021-07-30 DIAGNOSIS — R627 Adult failure to thrive: Secondary | ICD-10-CM | POA: Diagnosis not present

## 2021-07-30 DIAGNOSIS — C3412 Malignant neoplasm of upper lobe, left bronchus or lung: Secondary | ICD-10-CM | POA: Diagnosis not present

## 2021-07-30 LAB — BASIC METABOLIC PANEL
Anion gap: 5 (ref 5–15)
BUN: 14 mg/dL (ref 8–23)
CO2: 20 mmol/L — ABNORMAL LOW (ref 22–32)
Calcium: 8.3 mg/dL — ABNORMAL LOW (ref 8.9–10.3)
Chloride: 118 mmol/L — ABNORMAL HIGH (ref 98–111)
Creatinine, Ser: 0.85 mg/dL (ref 0.44–1.00)
GFR, Estimated: >60 mL/min (ref >60)
Glucose, Bld: 115 mg/dL — ABNORMAL HIGH (ref 70–99)
Potassium: 3.4 mmol/L — ABNORMAL LOW (ref 3.5–5.1)
Sodium: 143 mmol/L (ref 135–145)

## 2021-07-30 LAB — CBC
HCT: 25.9 % — ABNORMAL LOW (ref 36.0–46.0)
Hemoglobin: 8 g/dL — ABNORMAL LOW (ref 12.0–15.0)
MCH: 32.1 pg (ref 26.0–34.0)
MCHC: 30.9 g/dL (ref 30.0–36.0)
MCV: 104 fL — ABNORMAL HIGH (ref 80.0–100.0)
Platelets: 293 10*3/uL (ref 150–400)
RBC: 2.49 MIL/uL — ABNORMAL LOW (ref 3.87–5.11)
RDW: 17.9 % — ABNORMAL HIGH (ref 11.5–15.5)
WBC: 4.8 10*3/uL (ref 4.0–10.5)
nRBC: 0 % (ref 0.0–0.2)

## 2021-07-30 LAB — MAGNESIUM: Magnesium: 2 mg/dL (ref 1.7–2.4)

## 2021-07-30 MED ORDER — POTASSIUM CHLORIDE CRYS ER 20 MEQ PO TBCR: 40.0000 meq | EXTENDED_RELEASE_TABLET | Freq: Every day | ORAL | Status: DC

## 2021-07-30 NOTE — Progress Notes (Signed)
Cathy Glover   DOB:01-20-1956   AT#:[id number]    Subjective: Patient continues to feel weak.  Denies any pain.  No nausea no vomiting.  Continues to have shortness of breath on exertion.  Objective:  Vitals:   07/30/21 0516 07/30/21 0700  BP: 124/64 121/60  Pulse: 96 98  Resp: 19 18  Temp: 97.8 F (36.6 C) 97.8 F (36.6 C)  SpO2: 98% 98%     Intake/Output Summary (Last 24 hours) at 07/30/2021 2155 Last data filed at 07/30/2021 1628 Gross per 24 hour  Intake 120 ml  Output 650 ml  Net -530 ml    Physical Exam Vitals and nursing note reviewed.  Constitutional:      Comments: Accompanied by husband.  HENT:     Head: Normocephalic and atraumatic.     Mouth/Throat:     Pharynx: Oropharynx is clear.  Eyes:     Extraocular Movements: Extraocular movements intact.     Pupils: Pupils are equal, round, and reactive to light.  Cardiovascular:     Rate and Rhythm: Normal rate and regular rhythm.  Pulmonary:     Comments: Decreased breath sounds bilaterally.  Abdominal:     Palpations: Abdomen is soft.  Musculoskeletal:        General: Normal range of motion.     Cervical back: Normal range of motion.  Skin:    General: Skin is warm.  Neurological:     General: No focal deficit present.     Mental Status: She is alert and oriented to person, place, and time.  Psychiatric:        Behavior: Behavior normal.        Judgment: Judgment normal.     Labs:  Lab Results  Component Value Date   WBC 4.8 07/30/2021   HGB 8.0 (L) 07/30/2021   HCT 25.9 (L) 07/30/2021   MCV 104.0 (H) 07/30/2021   PLT 293 07/30/2021   NEUTROABS 4.4 07/17/2021    Lab Results  Component Value Date   NA 143 07/30/2021   K 3.4 (L) 07/30/2021   CL 118 (H) 07/30/2021   CO2 20 (L) 07/30/2021    Studies:  No results found.  Primary cancer of right lower lobe of lung [facility]) #65 year old female patient with history of extensive stage small cell lung cancer is currently admitted to hospital for  generalized weakness/worsening shortness of breath  # Stage IV/extensive stage small cell lung cancer-with metastasis to the right hilum/supraclavicular/subpectoral/axillary adenopathy; adrenal metastases and pelvic adenopathy/brain metastases status post whole brain radiation.  Currently s/p 2 cycles of carbo etoposide plus immunotherapy.  Restaging scans-July 27, 2021-shows progressive disease.  See discussion below  #Declining performance status-ECOG performance status 3; also multiple interruptions to therapy including neutropenic sepsis/cellulitis/failure to thrive.  Plan: I reviewed the findings of the progressive disease with the patient and husband detail.  Patient unfortunately has primary refractory small cell lung cancer-which is portends extremely poor prognosis.  Patient is a poor candidate for any further therapy given her multiple co-morbidities/poor tolerance to therapy/and high risk of death from chemotherapy.  I would recommend hospice given her life expectancy less than 6 months if malignancy takes natural course.  Discussed with Josh Borders/palliative care.  Also discussed with [place], hospice services.   # I reviewed the blood work- with the patient in detail; also reviewed the imaging independently [as summarized above]; and with the patient in detail.   # 40 minutes face-to-face with the patient discussing the above plan of care;  more than 50% of time spent on prognosis/ natural history; counseling and coordination.    Cammie Sickle, MD 07/30/2021  9:55 PM

## 2021-07-30 NOTE — Progress Notes (Addendum)
Manufacturing engineer [facility]) hospital liaison note:  New referral for [facility] hospice services at home received from [facility] over the weekend. Patient information has been reviewed by hospice MD and hospice eligibility has been confirmed. Writer met in the room with patient and her husband to initiate education regarding hospice services, philosophy and team approach to care with understanding voiced. Questions answered, DME has been ordered: Hospital bed and BSC.  Hospice contact information given to Mr. Darrow. Per Mr. Romero he would be able to receive DME today. He is unclear as to whether patient can discharge by car, she may need nonemergent transport. He was also unsure if the plan was for discharge today or tomorrow. TOC and MD updated. Please send completed out of facility DNR with patient at discharge. Thank you for the opportunity to be involved in the care of this patient.  Flo Shanks BSN, RN, [facility] [employer] [phone]

## 2021-07-30 NOTE — TOC Transition Note (Signed)
Transition of Care [facility]) - CM/SW Discharge Note   Patient Details  Name: Cathy Glover MRN: [id number] Date of Birth: March 09, 1956  [facility]) CM/SW Contact:  Shelbie Hutching, RN Phone Number: 07/30/2021, 4:04 PM   Clinical Narrative:    Patient is ready to be discharged home with [facility].  All equipment had been delivered to the home and husband is at the bedside.  Patient wants to go home with her husband so he will transport.  [place] will be out tomorrow for the patient's admission at 1pm.     Final next level of care: Home w Hospice Care Barriers to Discharge: Barriers Resolved   Patient Goals and CMS Choice Patient states their goals for this hospitalization and ongoing recovery are:: Home with hospice and patient wants husband to take her home CMS Medicare.gov Compare Post Acute Care list provided to:: Patient Choice offered to / list presented to : Patient, Spouse  Discharge Placement                       Discharge Plan and Services                DME Arranged: N/A DME Agency: NA                  Social Determinants of Health ([place]) Interventions     Readmission Risk Interventions Readmission Risk Prevention Plan 06/19/2021  Transportation Screening Complete  PCP or Specialist Appt within 3-5 Days Complete  HRI or [place] Complete  Social Work Consult for [place] Planning/Counseling Complete  Palliative Care Screening Not Applicable  Medication Review Press photographer) Complete

## 2021-07-30 NOTE — Discharge Summary (Signed)
Cathy Glover OBS:[id number] DOB: 02-23-1956 DOA: 07/26/2021  PCP: Cammie Sickle, MD  Admit date: 07/26/2021 Discharge date: 07/30/2021  Time spent: 35 minutes  Recommendations for Outpatient Follow-up:  Oncology f/u     Discharge Diagnoses:  Active Problems:   AKI (acute kidney injury) ([facility])   History of DVT in adulthood   Failure to thrive in adult   Protein-calorie malnutrition, severe   Discharge Condition: stable  Diet recommendation: regular  Filed Weights   07/26/21 1156 07/26/21 2028 07/27/21 1009  Weight: 39.3 kg 39.3 kg 41.7 kg    History of present illness:  Cathy Glover is a 65 y.o. 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  She has not eaten for more than a week.  She has had small sips of liquids intake.  Denies odynophagia but sometimes feels like food gets stuck in her throat.  Due to concern for dehydration, her husband contacted her oncologist for assistance. A month ago, her chemotherapy/immunotherapy was discontinued due to neutropenic sepsis and declining performance status.  Patient has been off chemotherapy for over a month.  She was recently admitted for Pseudomonas bacteremia and hospitalized between 06/17/2021 until 06/21/2021 with neutropenic sepsis.  She last saw her oncologist Dr. Lynett Fish on 05/03/2021 with plan to repeat CT scans on 08/02/2021.  She is mostly in the bed and becomes quite short of breath with minimal exertion.  Reports a persistent non productive cough.     Upon presentation to the ED she is weak appearing but alert and oriented.  Her  husband is at bedside and provides most of the history.  Work up revealed AKI and hypernatremia.  EDP requested admission due to dehydration, AKI, and hypernatremia.  Hospital Course:  Metastatic lung cancer # Failure to thrive Recent admit for neutropenia and pseudomonas bacteremia. Chemo on hold. Prognosis poor. CT c/a/p shows worsening disease. No viable tx options per onc. - pt desires to transition to DNR status, ordered - hospice at home following - outpt oncology f/u   # Decreased appetite # Possible oral candidiasis No dysphagia but dark plaques on tongue. Overall this all likely 2/2 her advanced cancer, but reasonable to treat for candida - continue home fluconazole  # AKI Prerenal, mild, resolved w/ fluids   # Hypernatremia 2/2 dehydration, resolved w/ fluids - monitor   # Anemia of chronic disease Hgb 8s, now 7.7, stable   # Hx DVT - cont apixaban   # Malnutrition, severe - RD consult  Procedures: none   Consultations: oncology  Discharge Exam: Vitals:   07/30/21 0516 07/30/21 0700  BP: 124/64 121/60  Pulse: 96 98  Resp: 19 18  Temp: 97.8 F (36.6 C) 97.8 F (36.6 C)  SpO2: 98% 98%    General exam: Appears calm and comfortable. Chronically ill appearing Respiratory system: Clear to auscultation. Respiratory effort normal. Cardiovascular system: S1 & S2 heard, RRR. No JVD, murmurs, rubs, gallops or clicks. No pedal edema. Gastrointestinal system: Abdomen is nondistended, soft and nontender. No organomegaly or masses felt. Normal bowel sounds heard. Central nervous system: Alert and oriented. No focal neurological deficits. Extremities: moves all 4 Skin: no lesions Psychiatry: Judgement and  insight appear normal. Affect is flat  Discharge Instructions   Discharge Instructions     Diet - low sodium heart healthy   Complete by: As directed    Increase activity slowly   Complete by: As directed       Allergies as of 07/30/2021   No Known  Allergies      Medication List     TAKE these medications    albuterol 108 (90 Base) MCG/ACT inhaler Commonly known as: VENTOLIN HFA Inhale 1-2 puffs into the lungs every 6 (six) hours as needed for wheezing or shortness of breath.   apixaban 5 MG Tabs tablet Commonly known as: ELIQUIS Take 1 tablet (5 mg total) by mouth 2 (two) times daily.   Breztri Aerosphere 160-9-4.8 MCG/ACT Aero Generic drug: Budeson-Glycopyrrol-Formoterol Inhale 2 puffs into the lungs in the morning and at bedtime.   dextromethorphan-guaiFENesin 30-600 MG 12hr tablet Commonly known as: MUCINEX DM Take 1 tablet by mouth 2 (two) times daily.   fluconazole 100 MG tablet Commonly known as: DIFLUCAN Take 1 tablet (100 mg total) by mouth daily.   lidocaine-prilocaine cream Commonly known as: EMLA Apply 1 application topically as needed.   loperamide 2 MG tablet Commonly known as: IMODIUM A-D Take 2 mg by mouth as needed for diarrhea or loose stools.   potassium chloride SA 20 MEQ tablet Commonly known as: KLOR-CON 1 pill twice a day       No Known Allergies    The results of significant diagnostics from this hospitalization (including imaging, microbiology, ancillary and laboratory) are listed below for reference.    Significant Diagnostic Studies: CT HEAD WO CONTRAST (5MM)  Result Date: 07/26/2021 CLINICAL DATA:  Neurological deficit EXAM: CT HEAD WITHOUT CONTRAST TECHNIQUE: Contiguous axial images were obtained from the base of the skull through the vertex without intravenous contrast. COMPARISON:  MRI head dated May 04, 2021 FINDINGS: Brain: Linear area of hypoattenuation involving the temporoparietal cortex associated calcification, likely treated metastatic disease. No new mass, evidence of acute infarct, hydrocephalus or extra-axial collection. Vascular: No hyperdense vessel or unexpected calcification. Skull: Fluid is seen in the right inner ear and mastoid airspaces. Negative for  fracture or focal lesion. Sinuses/Orbits: Air-fluid level of the sphenoid sinus. Other: None. IMPRESSION: No acute intracranial abnormality. Air-fluid level of the sphenoid sinus, findings can be seen in the setting of acute sinusitis Electronically Signed   By: Yetta Glassman M.D.   On: 07/26/2021 14:51   CT CHEST ABDOMEN PELVIS W CONTRAST  Result Date: 07/27/2021 CLINICAL DATA:  Metastatic lung cancer restaging EXAM: CT CHEST, ABDOMEN, AND PELVIS WITH CONTRAST TECHNIQUE: Multidetector CT imaging of the chest, abdomen and pelvis was performed following the standard protocol during bolus administration of intravenous contrast. CONTRAST:  55mL OMNIPAQUE IOHEXOL 350 MG/ML SOLN, additional oral enteric contrast COMPARISON:  PET-CT, 05/03/2021, CT chest, 04/12/2021 FINDINGS: CT CHEST FINDINGS Cardiovascular: Right chest port catheter. Aortic atherosclerosis. Normal heart size. No pericardial effusion. Mediastinum/Nodes: Interval enlargement of bulky, matted mediastinal, right hilar, left supraclavicular, and left axillary lymph nodes, largest left axillary node measuring 2.8 x 2.3 cm, previously 2.2 x 1.3 cm (series 2, image 8), largest pretracheal/anterior mediastinal node measuring 2.6 x 2.3 cm, previously 1.1 x 0.9 cm (series 2, image 17). Thyroid gland, trachea, and esophagus demonstrate no significant findings. Lungs/Pleura: Mild centrilobular and paraseptal emphysema. Bulky right hilar mass is slightly increased in size, difficult to accurately measure due to the presence of adjacent lymphadenopathy and postobstructive airspace disease, although approximately 6.0  x 4.9 cm, previously 4.0 x 3.9 cm when measured similarly (series 2, image 26). Interval increase in postobstructive consolidation of the right middle lobe, now complete. Significant mass effect on the right pulmonary artery and lobar branches, which narrows and nearly occludes the vessels. Spiculated, irregular nodules and opacities are increased  in size and confluence compared to prior examination, largest nodule of the right lower lobe measuring 2.8 x 2.3 cm, previously 2.0 x 1.6 cm when measured similarly (series 5, image 62). There is new, somewhat geographic ground-glass airspace opacity of the right lung, particularly suprahilar right upper lobe (series 5, image 35), and additionally in the paramedian left upper lobe (series 5, image 32). Interlobular septal thickening throughout the right lung, increased compared to prior examination. No pleural effusion or pneumothorax. Musculoskeletal: No chest wall mass or suspicious bone lesions identified. CT ABDOMEN PELVIS FINDINGS Hepatobiliary: Unchanged hypodense lesion of the anterior midline liver, adjacent to the falciform ligament, not previously FDG PET avid and favored to reflect focal fatty deposition or perhaps a benign liver lesion such as a focal nodular hyperplasia (series 2, image 46). No gallstones, gallbladder wall thickening, or biliary dilatation. Pancreas: Unremarkable. No pancreatic ductal dilatation or surrounding inflammatory changes. Spleen: Normal in size without significant abnormality. Adrenals/Urinary Tract: Significant interval enlargement of bilateral adrenal metastases, left adrenal mass measuring 3.2 x 2.1 cm, previously 2.1 x 1.7 cm (series 2, image 49). New, mild left hydronephrosis and hydroureter, without obstructing etiology identified to the left ureterovesicular junction. Distended, somewhat thickened urinary bladder. Stomach/Bowel: Stomach is within normal limits. Appendix appears normal. No evidence of bowel wall thickening, distention, or inflammatory changes. Vascular/Lymphatic: Aortic atherosclerosis. Interval enlargement of right external iliac lymph nodes, measuring up to 2.4 x 1.8 cm, previously 2.1 x 1.3 cm (series 2, image 91). Reproductive: No mass or other abnormality. Other: No abdominal wall hernia or abnormality. No abdominopelvic ascites. Musculoskeletal: No  acute or significant osseous findings. IMPRESSION: 1. Bulky right hilar mass is slightly increased in size, difficult to accurately measure due to the presence of adjacent lymphadenopathy and postobstructive airspace disease. 2. Interval increase in postobstructive consolidation of the right middle lobe, now complete. Significant mass effect on the right pulmonary artery and lobar branches, which narrows and nearly occludes the vessels. 3. There is interlobular septal thickening throughout the right lung, consistent with lymphangitic malignancy and/or interstitial edema due to pulmonary venous outflow obstruction. 4. Spiculated, irregular nodules and opacities throughout the right lung are increased in size and confluence compared to prior examination. 5. Interval enlargement of bulky, matted mediastinal, right hilar, left supraclavicular, left axillary, and right inguinal lymph nodes. 6. Significant interval enlargement of bilateral adrenal metastases. 7. Constellation of findings is consistent with worsened primary lung malignancy and metastatic disease. 8. There is new, somewhat geographic ground-glass airspace opacity of the right lung, particularly suprahilar right upper lobe, and additionally in the paramedian left upper lobe, likely developing radiation pneumonitis. 9. Distended, somewhat thickened urinary bladder, with new, mild left hydronephrosis and hydroureter, possibly due to back pressure. Correlate for urinary retention. Aortic Atherosclerosis (ICD10-I70.0). Electronically Signed   By: Eddie Candle M.D.   On: 07/27/2021 19:21   DG Chest Portable 1 View  Result Date: 07/26/2021 CLINICAL DATA:  Weakness, sepsis, metastatic lung cancer EXAM: PORTABLE CHEST 1 VIEW COMPARISON:  06/17/2021, 05/03/2021 FINDINGS: Single frontal view of the chest demonstrates right chest wall port via internal jugular approach tip overlying superior vena cava. The cardiac silhouette is unremarkable. There is increased  consolidation  within the right middle lobe, now obscuring the right hilar mass seen previously. Nodularity within the lateral right lung base consistent with metastatic disease observed on previous imaging. No effusion or pneumothorax. No acute bony abnormalities. IMPRESSION: 1. Increasing consolidation within the right middle lobe, obscuring the right hilar mass seen previously. This may reflect progression of disease and/or increasing postobstructive change. 2. Nodularity at the right lateral lung base consistent with metastatic disease seen previously. Electronically Signed   By: Randa Ngo M.D.   On: 07/26/2021 15:14    Microbiology: Recent Results (from the past 240 hour(s))  Resp Panel by RT-PCR (Flu A&B, Covid) Nasopharyngeal Swab     Status: None   Collection Time: 07/26/21  1:42 PM   Specimen: Nasopharyngeal Swab; Nasopharyngeal(NP) swabs in vial transport medium  Result Value Ref Range Status   SARS Coronavirus 2 by RT PCR NEGATIVE NEGATIVE Final    Comment: (NOTE) SARS-CoV-2 target nucleic acids are NOT DETECTED.  The SARS-CoV-2 RNA is generally detectable in upper respiratory specimens during the acute phase of infection. The lowest concentration of SARS-CoV-2 viral copies this assay can detect is 138 copies/mL. A negative result does not preclude SARS-Cov-2 infection and should not be used as the sole basis for treatment or other patient management decisions. A negative result may occur with  improper specimen collection/handling, submission of specimen other than nasopharyngeal swab, presence of viral mutation(s) within the areas targeted by this assay, and inadequate number of viral copies(<138 copies/mL). A negative result must be combined with clinical observations, patient history, and epidemiological information. The expected result is Negative.  Fact Sheet for Patients:  EntrepreneurPulse.com.au  Fact Sheet for Healthcare Providers:   IncredibleEmployment.be  This test is no t yet approved or cleared by the [place] FDA and  has been authorized for detection and/or diagnosis of SARS-CoV-2 by FDA under an Emergency Use Authorization (EUA). This EUA will remain  in effect (meaning this test can be used) for the duration of the COVID-19 declaration under Section 564(b)(1) of the Act, 21 U.S.C.section 360bbb-3(b)(1), unless the authorization is terminated  or revoked sooner.       Influenza A by PCR NEGATIVE NEGATIVE Final   Influenza B by PCR NEGATIVE NEGATIVE Final    Comment: (NOTE) The Xpert Xpress SARS-CoV-2/FLU/RSV plus assay is intended as an aid in the diagnosis of influenza from Nasopharyngeal swab specimens and should not be used as a sole basis for treatment. Nasal washings and aspirates are unacceptable for Xpert Xpress SARS-CoV-2/FLU/RSV testing.  Fact Sheet for Patients: EntrepreneurPulse.com.au  Fact Sheet for Healthcare Providers: IncredibleEmployment.be  This test is not yet approved or cleared by the [place] FDA and has been authorized for detection and/or diagnosis of SARS-CoV-2 by FDA under an Emergency Use Authorization (EUA). This EUA will remain in effect (meaning this test can be used) for the duration of the COVID-19 declaration under Section 564(b)(1) of the Act, 21 U.S.C. section 360bbb-3(b)(1), unless the authorization is terminated or revoked.  Performed at [facility], [address] [place], [place] [address]      Labs: Basic Metabolic Panel: Recent Labs  Lab 07/26/21 1243 07/27/21 0446 07/28/21 0530 07/28/21 1556 07/29/21 0531 07/30/21 0536  NA 148* 144 141  --  137 143  K 4.1 3.6 3.1*  --  3.5 3.4*  CL 112* 117* 111  --  110 118*  CO2 20* 21* 22  --  19* 20*  GLUCOSE 149* 109* 126*  --  125* 115*  BUN 43* 35* 21  --  10 14  CREATININE 1.34* 0.92 0.67  --  0.72 0.85  CALCIUM 9.8 8.6*  8.2*  --  8.1* 8.3*  MG  --  2.4  --  1.9 1.9 2.0  PHOS  --  3.1  --   --   --   --    Liver Function Tests: Recent Labs  Lab 07/26/21 1243 07/27/21 0446 07/28/21 0530  AST 69* 53* 26  ALT 56* 50* 31  ALKPHOS 122 100 96  BILITOT 1.4* 1.0 0.7  PROT 8.1 6.4* 5.7*  ALBUMIN 3.6 2.9* 2.6*   Recent Labs  Lab 07/26/21 1243  LIPASE 52*   No results for input(s): AMMONIA in the last 168 hours. CBC: Recent Labs  Lab 07/26/21 1243 07/27/21 0446 07/28/21 0530 07/29/21 0531 07/30/21 0536  WBC 7.4 5.0 4.5 4.9 4.8  HGB 10.8* 8.3* 7.4* 7.7* 8.0*  HCT 34.2* 26.4* 24.0* 24.8* 25.9*  MCV 104.6* 106.0* 105.7* 103.8* 104.0*  PLT 459* 329 301 292 293   Cardiac Enzymes: Recent Labs  Lab 07/26/21 1243  CKTOTAL 38   BNP: BNP (last 3 results) No results for input(s): BNP in the last 8760 hours.  ProBNP (last 3 results) No results for input(s): PROBNP in the last 8760 hours.  CBG: No results for input(s): GLUCAP in the last 168 hours.     Signed:  Desma Maxim MD.  [facility] 07/30/2021, 3:38 PM

## 2021-07-30 NOTE — Progress Notes (Signed)
Patient and husband verbalized understanding of discharge instructions. Patient d/c in stable condition via private vehicle with husband.

## 2021-08-02 ENCOUNTER — Ambulatory Visit: Payer: 59

## 2021-08-02 ENCOUNTER — Telehealth: Payer: Self-pay | Admitting: *Deleted

## 2021-08-02 NOTE — Telephone Encounter (Signed)
Contacted patient's husband. Pt had multiple apts next week. Pt now under hospice care. Husband would like to officially cnl all apts today and next week. Apts cnl per request.

## 2021-08-07 ENCOUNTER — Other Ambulatory Visit: Payer: 59

## 2021-08-07 ENCOUNTER — Ambulatory Visit: Payer: 59 | Admitting: Internal Medicine

## 2021-08-07 ENCOUNTER — Ambulatory Visit: Payer: 59

## 2021-08-08 ENCOUNTER — Ambulatory Visit: Payer: 59

## 2021-08-09 ENCOUNTER — Ambulatory Visit: Payer: 59

## 2021-08-10 ENCOUNTER — Ambulatory Visit: Payer: 59

## 2021-08-28 ENCOUNTER — Other Ambulatory Visit: Payer: Self-pay | Admitting: Hospice and Palliative Medicine

## 2021-08-28 MED ORDER — HYDROCOD POLST-CPM POLST ER 10-8 MG/5ML PO SUER: 5.0000 mL | Freq: Two times a day (BID) | ORAL | 0 refills | Status: DC | PRN

## 2021-08-28 NOTE — Progress Notes (Signed)
I spoke with patient's hospice nurse, Tillie Rung. Hospice requested Rx for tussionex due to persistent nighttime cough due to [place]. Will send Rx.

## 2021-08-29 ENCOUNTER — Telehealth: Payer: Self-pay | Admitting: *Deleted

## 2021-08-29 NOTE — Telephone Encounter (Signed)
Cathy Glover called asking if the Tussionex prescription can be changed to Tessalon perles due to Hospice not covering patient medications and family having to pay out of pocket fr medications. The cost of Tussionex is $98. The only thing on protocol is Guaifenesin and it does not work. Please advise

## 2021-08-30 ENCOUNTER — Encounter: Payer: Self-pay | Admitting: Internal Medicine

## 2021-09-19 ENCOUNTER — Telehealth: Payer: Self-pay | Admitting: *Deleted

## 2021-09-19 ENCOUNTER — Other Ambulatory Visit: Payer: Self-pay | Admitting: Hospice and Palliative Medicine

## 2021-09-19 MED ORDER — OXYCODONE HCL 5 MG PO TABS: 5.0000 mg | ORAL_TABLET | ORAL | 0 refills | Status: DC | PRN

## 2021-09-19 NOTE — Telephone Encounter (Signed)
Tillie Rung called asking for verbal order to do port flushes in patient home. Please advise, looks like last flush was 07/10/21 in our office, but she was in hospital after that.

## 2021-09-19 NOTE — Telephone Encounter (Signed)
Verbal order called to [facility] for port flushes

## 2021-09-19 NOTE — Progress Notes (Signed)
I received a call from patient's hospice nurse, Tillie Rung.  Patient is having generalized pain unrelieved with acetaminophen.  Family is requesting something stronger to keep patient comfortable.  We will start her on oxycodone.  Rx sent to pharmacy.

## 2021-09-21 ENCOUNTER — Telehealth: Payer: Self-pay | Admitting: Hospice and Palliative Medicine

## 2021-09-21 NOTE — Telephone Encounter (Signed)
Received call from hospice nurse. Oxycodone is causing GI symptoms and they would like to switch to tramadol. Okay with me.

## 2021-10-11 ENCOUNTER — Other Ambulatory Visit: Payer: Self-pay | Admitting: Internal Medicine

## 2021-10-19 ENCOUNTER — Other Ambulatory Visit: Payer: Self-pay | Admitting: Hospice and Palliative Medicine

## 2021-10-19 MED ORDER — HYDROCODONE BIT-HOMATROP MBR 5-1.5 MG/5ML PO SOLN: 5.0000 mL | Freq: Four times a day (QID) | ORAL | 0 refills | Status: DC | PRN

## 2021-10-19 NOTE — Progress Notes (Signed)
Spoke with hospice RN. Patient unable to previously get Tussionex due to cost. Will switch to hycodan.

## 2021-11-01 ENCOUNTER — Other Ambulatory Visit: Payer: Self-pay | Admitting: Hospice and Palliative Medicine

## 2021-11-01 MED ORDER — HYDROCODONE BIT-HOMATROP MBR 5-1.5 MG/5ML PO SOLN: 5.0000 mL | Freq: Four times a day (QID) | ORAL | 0 refills | Status: AC | PRN

## 2021-11-01 NOTE — Progress Notes (Signed)
Received a call from hospice nurse requesting refill of Hycodan cough syrup.  Will send Rx to pharmacy.

## 2021-11-09 ENCOUNTER — Telehealth: Payer: Self-pay | Admitting: *Deleted

## 2021-11-09 ENCOUNTER — Other Ambulatory Visit: Payer: Self-pay | Admitting: Internal Medicine

## 2021-11-09 MED ORDER — PREDNISONE 20 MG PO TABS: 20.0000 mg | ORAL_TABLET | Freq: Every morning | ORAL | 0 refills | Status: DC

## 2021-11-09 NOTE — Telephone Encounter (Signed)
Per message from Dr B, He will sned in prescription for Prednisone, left message on [facility] voice mail to let her know this

## 2021-11-09 NOTE — Telephone Encounter (Signed)
Cathy Glover with hospice called reporting that patient has progressive cough and is sensitive to codeine which makes her nauseated and she had tried tessalon perles without success. Cathy Glover asking if we could possible call in prescription for prednisone for this patient to see if that would help her cough and make her feel better in general. Please advise  Patient is self pay, so cannot afford high priced medications

## 2021-11-22 ENCOUNTER — Telehealth: Payer: Self-pay | Admitting: *Deleted

## 2021-11-22 NOTE — Telephone Encounter (Signed)
Msg rcvd from pt's Husband requesting a call back from [facility] to discuss a short term disability form for his wife.  Wife returned at 11:21 am by myself.   He discussed this with Dr. B in the past, but never had the forms sent to the cancer center for completion. Pt is now on hospice and he just has not had time to call until now. I provided him with the fax # to the cancer center to have the forms sent. I explained that the hard copy forms can also be dropped off to my attn. I would appreciate if he would write down the patient's last day of employment as this would aid in completion of the form. He said that he would have the disability office sent the forms to our office as soon as possible. He thanked me for calling him back.

## 2021-11-23 NOTE — Telephone Encounter (Addendum)
On 11/23/21- I informed the patient's husband that [employer] faxed over documents that were submitted on 05/07/2021 (along with my previous fax coversheet). I asked the patient's husband to contact [employer] to get an updated blank form faxed to our office. He did not know why the company faxed back a previous submitted form, but he will let them know to send an updated blank copy for Dr. Sharmaine [facility] office to complete. He stated that it's been difficult dealing with the [facility] as well as other agency given his wife's mental capacity. Wife is not able to speak for herself. The [employer] would not even talk with him directly and insisted that his wife give verbal permission to discuss her care. He explained that his wife is incapacitated and not able to speak for herself. The [employer] still would not discuss the care. He had to contact his wife's former HR dept to get this accomplished. He will contact the HR again to see if he can get this accomplished. He stated that the [place] [employer] had stopped sending any checks/direct deposit. He was trying to figure out why this occurred. We discussed that the dates on the form (where is wife is unable to work) will most likely need to be adjusted. Hopefully this will resolve the issue once the form/claim is resubmitted. We discussed that having a Durable Power of Attorney in place could help resolve some of the communication issues for financial information/business/monetary/insurance needs etx. He currently does not have a HPOA or Durable POA. He said he would look into this.  Pt's husband thanked me for calling him about the form. He will try to get a new form sent to our office.

## 2021-11-26 NOTE — Telephone Encounter (Signed)
A Blank Form for [employer]- received on 11/26/21. Form completed/signed by Dr. Rogue Bussing and faxed back to [employer].

## 2021-12-03 ENCOUNTER — Telehealth: Payer: Self-pay | Admitting: *Deleted

## 2021-12-03 MED ORDER — PREDNISONE 10 MG PO TABS: 10.0000 mg | ORAL_TABLET | Freq: Every day | ORAL | 0 refills | Status: DC

## 2021-12-03 NOTE — Telephone Encounter (Signed)
Kendra with Hospice called stating that the Prednisone has helped with patient cough and is asking if patient is to get refill which we would need to send or if she is to stop it,. Please advise

## 2021-12-03 NOTE — Telephone Encounter (Signed)
Received incoming fax on 12/03/21 from [employer]. Patient's short term disablity had expired on 07/29/21. Therefore a Long Term Disability Claim will need to be filed a 2 page form was completed and faxed back to [place] to start this process for the patient. Fax confirmation rcvd.

## 2021-12-03 NOTE — Telephone Encounter (Signed)
I spoke with [facility] Triage nurse and advised of medicine changes as Tillie Rung is off this afternoon

## 2021-12-13 ENCOUNTER — Other Ambulatory Visit: Payer: Self-pay | Admitting: Hospice and Palliative Medicine

## 2021-12-13 MED ORDER — MORPHINE SULFATE (CONCENTRATE) 10 MG /0.5 ML PO SOLN: 5.0000 mg | ORAL | 0 refills | Status: DC | PRN

## 2021-12-13 NOTE — Progress Notes (Signed)
I received a call from hospice nurse, Tillie Rung. Hospice would like to start morphine elixir for pain/dyspnea. Will send rx to pharmacy.

## 2021-12-31 ENCOUNTER — Other Ambulatory Visit: Payer: Self-pay | Admitting: *Deleted

## 2022-01-01 ENCOUNTER — Encounter: Payer: Self-pay | Admitting: Internal Medicine

## 2022-01-01 MED ORDER — PREDNISONE 10 MG PO TABS: 5.0000 mg | ORAL_TABLET | Freq: Every day | ORAL | 0 refills | Status: AC

## 2022-01-18 ENCOUNTER — Other Ambulatory Visit: Payer: Self-pay | Admitting: Hospice and Palliative Medicine

## 2022-01-18 MED ORDER — MORPHINE SULFATE (CONCENTRATE) 10 MG /0.5 ML PO SOLN: 5.0000 mg | ORAL | 0 refills | Status: DC | PRN

## 2022-01-18 NOTE — Progress Notes (Signed)
Received a call from hospice nurse, Tillie Rung.  Reportedly, patient has had worsening right upper extremity edema.  Patient is also actively declining.  We discussed option of upper extremity Doppler versus just focusing on comfort.  Patient is still anticoagulated on Eliquis.  Nurse will speak with family regarding goals. ?

## 2022-02-07 ENCOUNTER — Encounter: Payer: Self-pay | Admitting: Internal Medicine

## 2022-02-07 ENCOUNTER — Emergency Department
Admission: EM | Admit: 2022-02-07 | Discharge: 2022-02-07 | Disposition: A | Discharge status: Home / Self Care | Payer: 59 | Attending: Emergency Medicine | Admitting: Emergency Medicine

## 2022-02-07 ENCOUNTER — Emergency Department: Payer: 59

## 2022-02-07 ENCOUNTER — Encounter: Payer: Self-pay | Admitting: Emergency Medicine

## 2022-02-07 ENCOUNTER — Other Ambulatory Visit: Payer: Self-pay

## 2022-02-07 DIAGNOSIS — Z85118 Personal history of other malignant neoplasm of bronchus and lung: Secondary | ICD-10-CM | POA: Diagnosis not present

## 2022-02-07 DIAGNOSIS — Z515 Encounter for palliative care: Secondary | ICD-10-CM | POA: Diagnosis not present

## 2022-02-07 DIAGNOSIS — R600 Localized edema: Secondary | ICD-10-CM | POA: Insufficient documentation

## 2022-02-07 DIAGNOSIS — Z87891 Personal history of nicotine dependence: Secondary | ICD-10-CM | POA: Insufficient documentation

## 2022-02-07 DIAGNOSIS — E876 Hypokalemia: Secondary | ICD-10-CM | POA: Diagnosis not present

## 2022-02-07 DIAGNOSIS — Z7901 Long term (current) use of anticoagulants: Secondary | ICD-10-CM | POA: Diagnosis not present

## 2022-02-07 DIAGNOSIS — R221 Localized swelling, mass and lump, neck: Secondary | ICD-10-CM | POA: Diagnosis not present

## 2022-02-07 DIAGNOSIS — I871 Compression of vein: Secondary | ICD-10-CM

## 2022-02-07 DIAGNOSIS — I3139 Other pericardial effusion (noninflammatory): Secondary | ICD-10-CM

## 2022-02-07 DIAGNOSIS — J90 Pleural effusion, not elsewhere classified: Secondary | ICD-10-CM

## 2022-02-07 LAB — COMPREHENSIVE METABOLIC PANEL
ALT: 11 U/L (ref 0–44)
AST: 27 U/L (ref 15–41)
Albumin: 2.8 g/dL — ABNORMAL LOW (ref 3.5–5.0)
Alkaline Phosphatase: 96 U/L (ref 38–126)
Anion gap: 10 (ref 5–15)
BUN: 16 mg/dL (ref 8–23)
CO2: 27 mmol/L (ref 22–32)
Calcium: 8.7 mg/dL — ABNORMAL LOW (ref 8.9–10.3)
Chloride: 105 mmol/L (ref 98–111)
Creatinine, Ser: 0.66 mg/dL (ref 0.44–1.00)
GFR, Estimated: >60 mL/min (ref >60)
Glucose, Bld: 107 mg/dL — ABNORMAL HIGH (ref 70–99)
Potassium: 3.2 mmol/L — ABNORMAL LOW (ref 3.5–5.1)
Sodium: 142 mmol/L (ref 135–145)
Total Bilirubin: 0.7 mg/dL (ref 0.3–1.2)
Total Protein: 7.2 g/dL (ref 6.5–8.1)

## 2022-02-07 LAB — CBC
HCT: 35.9 % — ABNORMAL LOW (ref 36.0–46.0)
Hemoglobin: 10.7 g/dL — ABNORMAL LOW (ref 12.0–15.0)
MCH: 28.9 pg (ref 26.0–34.0)
MCHC: 29.8 g/dL — ABNORMAL LOW (ref 30.0–36.0)
MCV: 97 fL (ref 80.0–100.0)
Platelets: 608 10*3/uL — ABNORMAL HIGH (ref 150–400)
RBC: 3.7 MIL/uL — ABNORMAL LOW (ref 3.87–5.11)
RDW: 15.8 % — ABNORMAL HIGH (ref 11.5–15.5)
WBC: 4.7 10*3/uL (ref 4.0–10.5)
nRBC: 0 % (ref 0.0–0.2)

## 2022-02-07 MED ORDER — IOHEXOL 350 MG/ML SOLN
60.0000 mL | Freq: Once | INTRAVENOUS | Status: AC | PRN
  Administered 2022-02-07: 60 mL via INTRAVENOUS

## 2022-02-07 NOTE — ED Provider Notes (Addendum)
? ?[facility] ?Provider Note ? ? ? Event Date/Time  ? First MD Initiated Contact with Patient 02/07/22 [phone]   ?  (approximate) ? ?History  ? ?Chief Complaint: Neck and arm swelling ? ?HPI ? ?Cathy Glover is a 66 y.o. female with a past medical history of stage IV small cell lung cancer now on hospice/palliative care who presents to the emergency department for bilateral upper extremity swelling and a mass to her left neck.  According to the patient and family for the past month or so she has noticed increased swelling to her left arm with a mass to her left neck.  She is now experiencing some swelling of the right upper extremity as well.  She spoke to her doctor who recommended she come to the emergency department for CT imaging.  Patient states some shortness of breath at times especially when she lies flat.  No chest pain.  Patient has a port. ? ?Physical Exam  ? ?Triage Vital Signs: ?ED Triage Vitals  ?Enc Vitals Group  ?   BP 02/07/22 0919 122/90  ?   Pulse Rate 02/07/22 0919 (!) 103  ?   Resp 02/07/22 0919 20  ?   Temp 02/07/22 0919 (!) 97.5 ?F (36.4 ?C)  ?   Temp Source 02/07/22 0919 Oral  ?   SpO2 02/07/22 0919 98 %  ?   Weight 02/07/22 0923 91 lb 14.9 oz (41.7 kg)  ?   Height 02/07/22 0923 4\' 11"  (1.499 m)  ?   Head Circumference --   ?   Peak Flow --   ?   Pain Score 02/07/22 0923 0  ?   Pain Loc --   ?   Pain Edu? --   ?   Excl. in [place]? --   ? ? ?Most recent vital signs: ?Vitals:  ? 02/07/22 0930 02/07/22 1045  ?BP: 101/63 111/68  ?Pulse: (!) 57 97  ?Resp:  18  ?Temp:    ?SpO2: 91% 100%  ? ? ?General: Awake, no distress.  ?CV:  Good peripheral perfusion.  Regular rate and rhythm  ?Resp:  Normal effort.  Equal breath sounds bilaterally.  ?Abd:  No distention.  Soft, nontender.  No rebound or guarding. ?Other:  Patient does have moderate swelling of bilateral upper extremities, left greater than right she also has a solid feeling mass to her left neck that is nontender. ? ? ?ED  Results / Procedures / Treatments  ? ?RADIOLOGY ? ?I personally reviewed the CT images appears to have a large right-sided pleural effusion as well as masses within the lungs mostly right-sided. ?IMPRESSION:  ?1.   No evidence of pulmonary embolus.  ?2. Increased size of bulky right hilar mass and mediastinal/hilar  ?lymph nodes causing severe narrowing of the superior vena cava.  ?Numerous venous collaterals of the left chest and left upper  ?quadrant. 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  ?3. Severe right pulmonary artery narrowing and occlusion of the  ?right pulmonary veins. Multiple peripheral opacities of the aerated  ?right upper lobe, likely due to pulmonary infarcts.  ?4. New moderate pericardial effusion with flattening of the right  ?ventricular wall, concerning for tamponade physiology. Recommend  ?correlation with echocardiography.  ?5. Severe narrowing of the right upper lobe bronchi and complete  ?occlusion of the right middle lobe and right lower lobe  ?bronchi.Collapsed right middle and lower lobes with heterogeneous  ?enhancement, likely due to a combination of atelectatic lung and  ?tumor.  ?  6. Large loculated right pleural effusion and small left pleural  ?effusion.  ? ? ?MEDICATIONS ORDERED IN ED: ?Medications - No data to display ? ? ?IMPRESSION / MDM / ASSESSMENT AND PLAN / ED COURSE  ?I reviewed the triage vital signs and the nursing notes. ? ?Patient presents to the emergency department for bilateral upper extremity swelling and a mass to her left neck.  Patient has known small cell lung cancer with metastatic disease currently on palliative and hospice care at home.  However given the increased swelling they were referred to the emergency department for CT scan to see if any intervention is needed or warranted.  Overall the patient appears well she is calm cooperative she is in no distress denies any shortness of breath at this time but states she will get short of breath if she lies  flat.  Denies any chest pain at any point.  We will check basic labs and obtain a CT angiography of the chest to further evaluate.  Patient agreeable to plan. ? ?Patient's lab work shows fairly normal CBC, slight hypokalemia otherwise normal chemistry. ?Patient CTA has resulted showing significant abnormalities including large pleural effusion large pericardial effusion, SVC syndrome in addition to other findings.  I had a long discussion with the patient and husband regarding the patient's extensive CT findings went over imaging with them as well, and discussed the unfortunate findings and grim prognosis.  They are on palliative care, but they are not sure what they wish to do at this point as far as treatment and goals of care.  I spoke to Dr. Grayland Ormond of oncology who is sending his PA who is well-established with the patient over to speak to the patient and husband in more depth to see what they wish to do going forward to remain on palliative care versus undergoing treatment or operations. ? ?Oncology has been down to speak to the patient.  They have decided ultimately to go home with continued palliative care.  Oncology has requested a bed at hospice home and have called in additional medications including Ativan and morphine for the patient to use at home.  Patient will be discharged from the emergency department. ? ?FINAL CLINICAL IMPRESSION(S) / ED DIAGNOSES  ? ?Upper extremity edema ?Neck mass ? ? ?Note:  This document was prepared using Dragon voice recognition software and may include unintentional dictation errors. ?  ? ? ?  ?Harvest Dark, MD ?02/07/22 1407 ? ?  ?Harvest Dark, MD ?02/07/22 1443 ? ?

## 2022-02-07 NOTE — Consult Note (Signed)
? ?  ?Palliative Medicine ?[place] [facility] ?[phone] Fax:(336) [phone] ? ? ?Name: Cathy Glover ?Date: 02/07/2022 ?MRN: [id number]  ?DOB: 06-05-1956 ? ?Patient Care Team: ?Cammie Sickle, MD as PCP - General (Internal Medicine) ?Telford Nab, RN as Sales executive  ? ? ?REASON FOR CONSULTATION: ?Cathy Glover is a 66 y.o. female with multiple medical problems including extensive stage small cell lung cancer currently under hospice care who presented to the ER for evaluation of worsening shortness of breath and bilateral lower extremity/facial edema.  CTA revealed SVC syndrome.  Palliative care was consulted to address goals. ? ?SOCIAL HISTORY:    ? reports that she quit smoking about 9 months ago. Her smoking use included cigarettes. She has a 25.00 pack-year smoking history. She has never used smokeless tobacco. She reports that she does not drink alcohol and does not use drugs. ? ?Patient is married and lives at home with her husband. ? ?ADVANCE DIRECTIVES:  ?None on file ? ?CODE STATUS: DNR ? ?PAST MEDICAL HISTORY: ?Past Medical History:  ?Diagnosis Date  ? Cancer [facility])   ? Pulmonary nodule   ? ? ?PAST SURGICAL HISTORY:  ?Past Surgical History:  ?Procedure Laterality Date  ? IR IMAGING GUIDED PORT INSERTION  05/11/2021  ? VIDEO BRONCHOSCOPY WITH ENDOBRONCHIAL ULTRASOUND N/A 04/25/2021  ? Procedure: VIDEO BRONCHOSCOPY WITH ENDOBRONCHIAL ULTRASOUND;  Surgeon: Tyler Pita, MD;  Location: [facility] ORS;  Service: Cardiopulmonary;  Laterality: N/A;  ? ? ?HEMATOLOGY/ONCOLOGY HISTORY:  ?Oncology History Overview Note  ?# June 2022-  ?A. LUNG, RIGHT LOWER LOBE; BIOPSY: [Dr.Gonzalez] SMALL CELL CARCINOMA.  ? ?#Stage IV/extensive stage small cell lung cancer-with metastasis to the right hilum/supraclavicular/subpectoral/axillary adenopathy; adrenal metastases and pelvic adenopathy.  June 2022 brain MRI-metastasis [s/p WBRT- 7/22]. S/p  carboplatin etoposide  #1-day 1 [because of ongoing whole brain radiation-] ? ?# s/p cycle #2 of carbo-etop-Tecentriq-neutropenic sepsis/Pseudomonas sepsis-orbital cellulitis ? ?# AUG 2022 -neutropenic sepsis Pseudomonas bacteremia orbital cellulitis ; right IJ DVT-on Eliquis ?  ?Primary cancer of right lower lobe of lung ([place])  ?05/01/2021 Initial Diagnosis  ? Primary cancer of right lower lobe of lung ([place]) ?  ?05/01/2021 Cancer Staging  ? Staging form: Lung, AJCC 8th Edition ?- Clinical: Stage IVB (cT3, cN2, pM1c) - Signed by Cammie Sickle, MD on 05/10/2021 ?Stage prefix: Initial diagnosis ? ?  ?05/15/2021 -  Chemotherapy  ?  Patient is on Treatment Plan: LUNG SCLC CARBOPLATIN + ETOPOSIDE + ATEZOLIZUMAB INDUCTION Q21D / ATEZOLIZUMAB MAINTENANCE Q21D ? ?  ? ?  ? ? ?ALLERGIES:  has No Known Allergies. ? ?MEDICATIONS:  ?No current facility-administered medications for this encounter.  ? ?Current Outpatient Medications  ?Medication Sig Dispense Refill  ? albuterol (VENTOLIN HFA) 108 (90 Base) MCG/ACT inhaler INHALE 1 TO 2 PUFFS INTO THE LUNGS EVERY 6 HOURS AS NEEDED FOR WHEEZING OR SHORTNESS OF BREATH 6.7 g 3  ? apixaban (ELIQUIS) 5 MG TABS tablet Take 1 tablet (5 mg total) by mouth 2 (two) times daily. 60 tablet 1  ? Budeson-Glycopyrrol-Formoterol (BREZTRI AEROSPHERE) 160-9-4.8 MCG/ACT AERO Inhale 2 puffs into the lungs in the morning and at bedtime. 5.9 g 0  ? dextromethorphan-guaiFENesin (MUCINEX DM) 30-600 MG 12hr tablet Take 1 tablet by mouth 2 (two) times daily.    ? fluconazole (DIFLUCAN) 100 MG tablet Take 1 tablet (100 mg total) by mouth daily. 7 tablet 0  ? HYDROcodone bit-homatropine (HYCODAN) 5-1.5 MG/5ML syrup Take 5 mLs by mouth every 6 (six) hours as needed  for cough. 120 mL 0  ? lidocaine-prilocaine (EMLA) cream Apply 1 application topically as needed. 30 g 1  ? loperamide (IMODIUM A-D) 2 MG tablet Take 2 mg by mouth as needed for diarrhea or loose stools.    ? Morphine Sulfate (MORPHINE CONCENTRATE) 10 mg / 0.5 ml  concentrated solution Take 0.25 mLs (5 mg total) by mouth every 2 (two) hours as needed for severe pain or shortness of breath. 30 mL 0  ? potassium chloride SA (KLOR-CON) 20 MEQ tablet 1 pill twice a day 30 tablet 3  ? predniSONE (DELTASONE) 10 MG tablet Take 0.5 tablets (5 mg total) by mouth daily with breakfast. 30 tablet 0  ? ? ?VITAL SIGNS: ?BP 97/75   Pulse 100   Temp (!) 97.5 ?F (36.4 ?C) (Oral)   Resp 20   Ht 4\' 11"  (1.499 m)   Wt 91 lb 14.9 oz (41.7 kg)   SpO2 99%   BMI 18.57 kg/m?  ?Filed Weights  ? 02/07/22 0923  ?Weight: 91 lb 14.9 oz (41.7 kg)  ?  ?Estimated body mass index is 18.57 kg/m? as calculated from the following: ?  Height as of this encounter: 4\' 11"  (1.499 m). ?  Weight as of this encounter: 91 lb 14.9 oz (41.7 kg). ? ?LABS: ?CBC: ?   ?Component Value Date/Time  ? WBC 4.7 02/07/2022 1046  ? HGB 10.7 (L) 02/07/2022 1046  ? HCT 35.9 (L) 02/07/2022 1046  ? PLT 608 (H) 02/07/2022 1046  ? MCV 97.0 02/07/2022 1046  ? NEUTROABS 4.4 07/17/2021 0935  ? LYMPHSABS 1.2 07/17/2021 0935  ? MONOABS 0.5 07/17/2021 0935  ? EOSABS 0.1 07/17/2021 0935  ? BASOSABS 0.1 07/17/2021 0935  ? ?Comprehensive Metabolic Panel: ?   ?Component Value Date/Time  ? NA 142 02/07/2022 1135  ? K 3.2 (L) 02/07/2022 1135  ? CL 105 02/07/2022 1135  ? CO2 27 02/07/2022 1135  ? BUN 16 02/07/2022 1135  ? CREATININE 0.66 02/07/2022 1135  ? GLUCOSE 107 (H) 02/07/2022 1135  ? CALCIUM 8.7 (L) 02/07/2022 1135  ? AST 27 02/07/2022 1135  ? ALT 11 02/07/2022 1135  ? ALKPHOS 96 02/07/2022 1135  ? BILITOT 0.7 02/07/2022 1135  ? PROT 7.2 02/07/2022 1135  ? ALBUMIN 2.8 (L) 02/07/2022 1135  ? ? ?RADIOGRAPHIC STUDIES: ?CT Angio Chest PE W and/or Wo Contrast ? ?Result Date: 02/07/2022 ?CLINICAL DATA:  Left arm right arm swelling EXAM: CT ANGIOGRAPHY CHEST WITH CONTRAST TECHNIQUE: Multidetector CT imaging of the chest was performed using the standard protocol during bolus administration of intravenous contrast. Multiplanar CT image  reconstructions and MIPs were obtained to evaluate the vascular anatomy. RADIATION DOSE REDUCTION: This exam was performed according to the departmental dose-optimization program which includes automated exposure control, adjustment of the mA and/or kV according to patient size and/or use of iterative reconstruction technique. CONTRAST:  1mL OMNIPAQUE IOHEXOL 350 MG/ML SOLN COMPARISON:  CT chest abdomen and pelvis dated July 27, 2021 FINDINGS: Cardiovascular: Adequate contrast opacification of the pulmonary arteries with no evidence of pulmonary embolus. Severe narrowing of the superior vena cava, right pulmonary artery and occlusion of the right pulmonary veins. Numerous venous collaterals of the left chest and left upper quadrant. New moderate right pleural effusion with flattening of the right ventricular wall. Normal heart size. Atherosclerotic disease of the thoracic aorta. Mild coronary artery calcifications. Mediastinum/Nodes: Esophagus and thyroid are unremarkable. Right hilar mass is difficult to measure due to presence of adjacent lymphadenopathy and collapsed lung, but is  increased in size when compared to prior exam. Bulky mediastinal, right hilar, bilateral supraclavicular and left axillary lymph nodes, increased in size when compared with prior exam. Reference conglomerate of prevascular lymph nodes measures 5.0 x 2.8 cm on series 7, image 69, previously largest lymph node at this location measured 9 mm. Reference enlarged left axillary lymph node measures 2.7 cm in short axis on series 7, image 71, previously 1.8. Lungs/Pleura: Severe narrowing of the right upper lobe bronchi and complete occlusion of the right middle lobe and left lower lobe bronchi. Collapsed right middle and lower lobes with heterogeneous enhancement, likely due to a combination of atelectatic lung and tumor. Increased nodular thickening multiple peripheral opacities of the aerated right upper lobe, likely due to pulmonary  infarcts. Large right and small left pleural effusions. Upper Abdomen: Interval increased size of bilateral adrenal masses. Left adrenal mass measures 3.9 x 3.5 cm, previously 3.3 x 2.2 cm. Musculoskeletal: No chest wall abnormal

## 2022-02-07 NOTE — ED Notes (Signed)
Patient transported to CT 

## 2022-02-07 NOTE — Progress Notes (Signed)
[facility] ED01 [employer] engineer [facility])   ?    ?This patient is a current hospice patient with [facility], admitted with a terminal diagnosis right lower lobe stage IV small cell lung cancer with metastasis to adrenal, brain, and right helium/supraclavicular/pectoral/axillary/ pelvic lymphadenopathy  ?  ?[facility] will continue to follow for any discharge planning needs and to coordinate continuation of hospice care.  ?   ?Please call with any questions/concerns.  ?  ?Thank you for the opportunity to participate in this patient's care. ? ?Daphene Calamity, MSW ?[place]  ?[phone] ? ?

## 2022-02-07 NOTE — ED Triage Notes (Addendum)
Presents via EMS from home  states she developed swelling to left clavicle/ about [redacted] weeks along with  swelling to left arm   also has had swelling to right arm at the beginning of the month  ?

## 2022-02-14 ENCOUNTER — Other Ambulatory Visit: Payer: Self-pay | Admitting: *Deleted

## 2022-02-15 ENCOUNTER — Encounter: Payer: Self-pay | Admitting: Internal Medicine

## 2022-02-15 MED ORDER — MORPHINE SULFATE (CONCENTRATE) 10 MG /0.5 ML PO SOLN: 5.0000 mg | ORAL | 0 refills | Status: DC | PRN

## 2022-02-28 ENCOUNTER — Other Ambulatory Visit: Payer: Self-pay | Admitting: *Deleted

## 2022-02-28 MED ORDER — LORAZEPAM 0.5 MG PO TABS: 0.5000 mg | ORAL_TABLET | ORAL | 1 refills | Status: AC | PRN

## 2022-03-07 ENCOUNTER — Other Ambulatory Visit: Payer: Self-pay | Admitting: Hospice and Palliative Medicine

## 2022-03-07 MED ORDER — MORPHINE SULFATE (CONCENTRATE) 10 MG /0.5 ML PO SOLN
5.0000 mg | ORAL | 0 refills | Status: AC | PRN
Start: 2022-03-07 — End: ?

## 2022-03-07 NOTE — Progress Notes (Signed)
Hospice RN requested refill of morphine elixir. Rx sent to pharmacy.  ?

## 2022-03-27 ENCOUNTER — Telehealth: Payer: Self-pay | Admitting: Internal Medicine

## 2022-03-27 NOTE — Telephone Encounter (Signed)
I called patient's husband and offered my condolences.  Family very thankful for the call. Appreciate the cancer center staff for the care and support provided to the patient.   

## 2022-04-11 DEATH — deceased

## 2023-02-23 IMAGING — RF DG FLUORO GUIDE CV LINE
2 series · 5 of 5 positions shown · non-contrast
Comparison: none

CLINICAL DATA: Small-cell lung carcinoma, status post port catheter
placement 06/06/2021. Right IJ nonocclusive thrombus recently noted
on ultrasound. Recent inability to aspirate from the port.

EXAM:
PORT  CATHETER INJECTION UNDER FLUOROSCOPY
TECHNIQUE: The procedure, risks (including but not limited to bleeding,
infection, organ damage ), benefits, and alternatives were explained
to the patient. Questions regarding the procedure were encouraged
and answered. The patient understands and consents to the procedure.

[Series 1: fluoro_iodine 2fps_bw · 0.17mm/px · 1 of 1 slices shown]
[im 1/1]
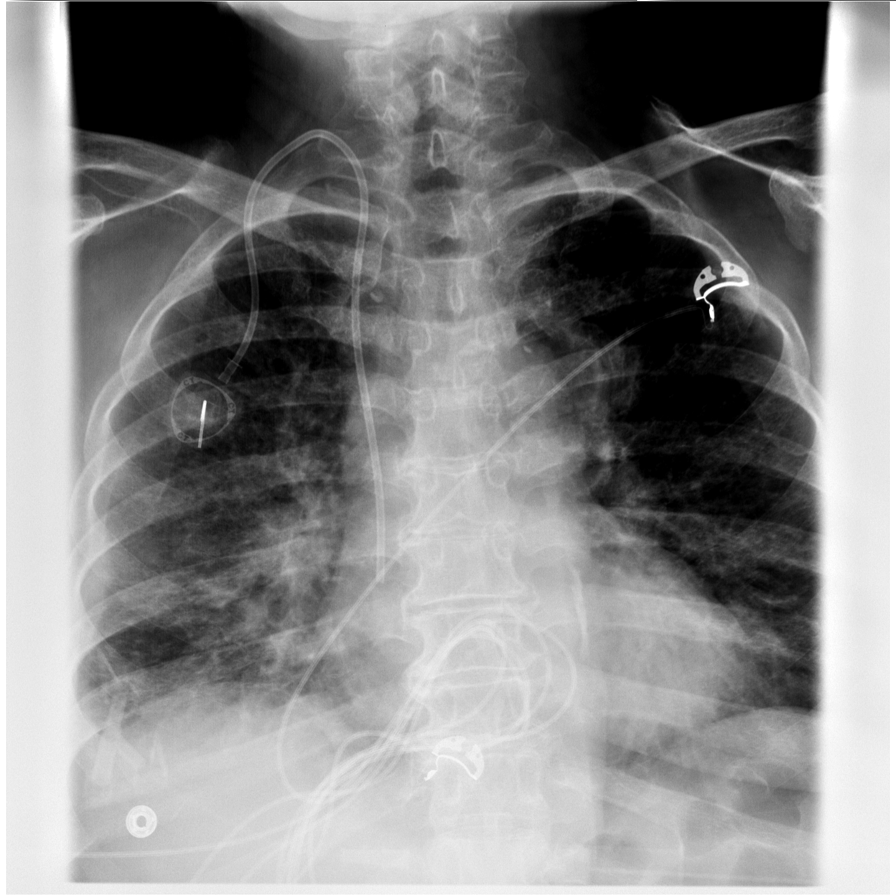

[Series 2: cp_standard · 0.26mm/px · 4 of 33 frames shown]
[frame 1/33]
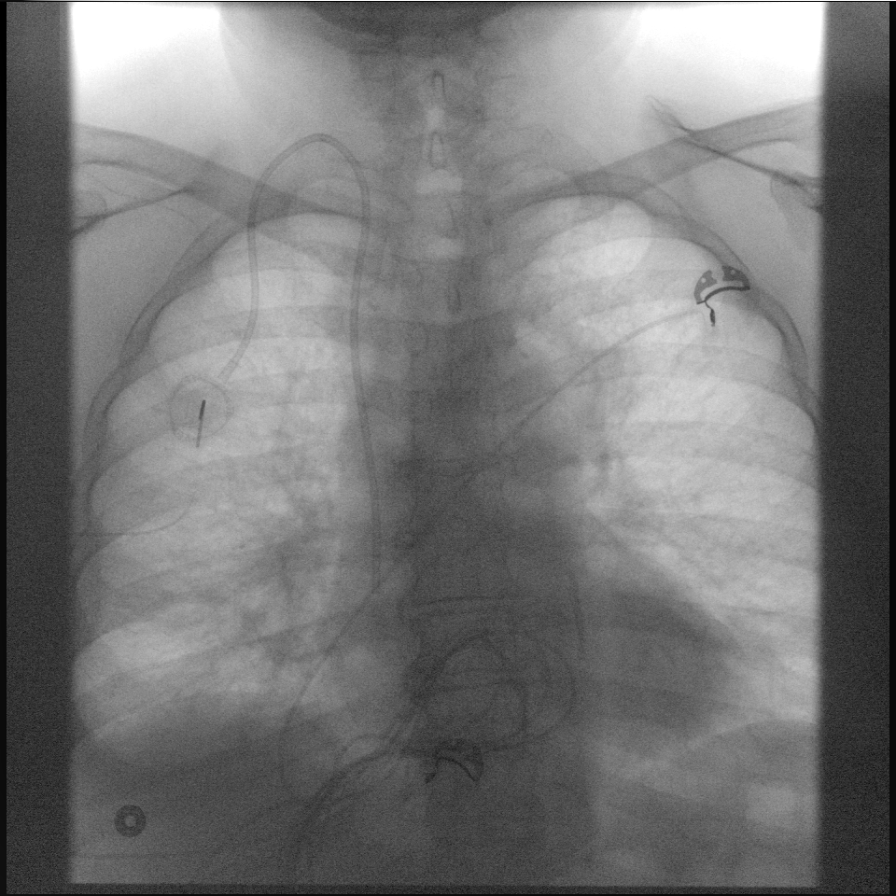
[frame 5/33]
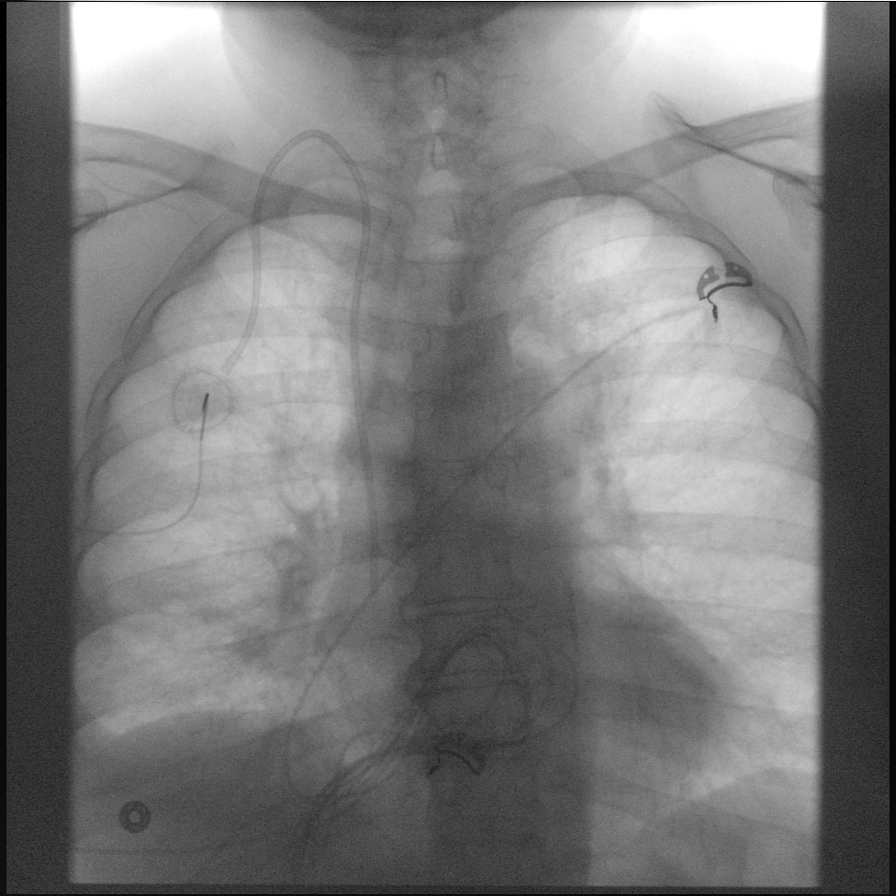
[frame 17/33]
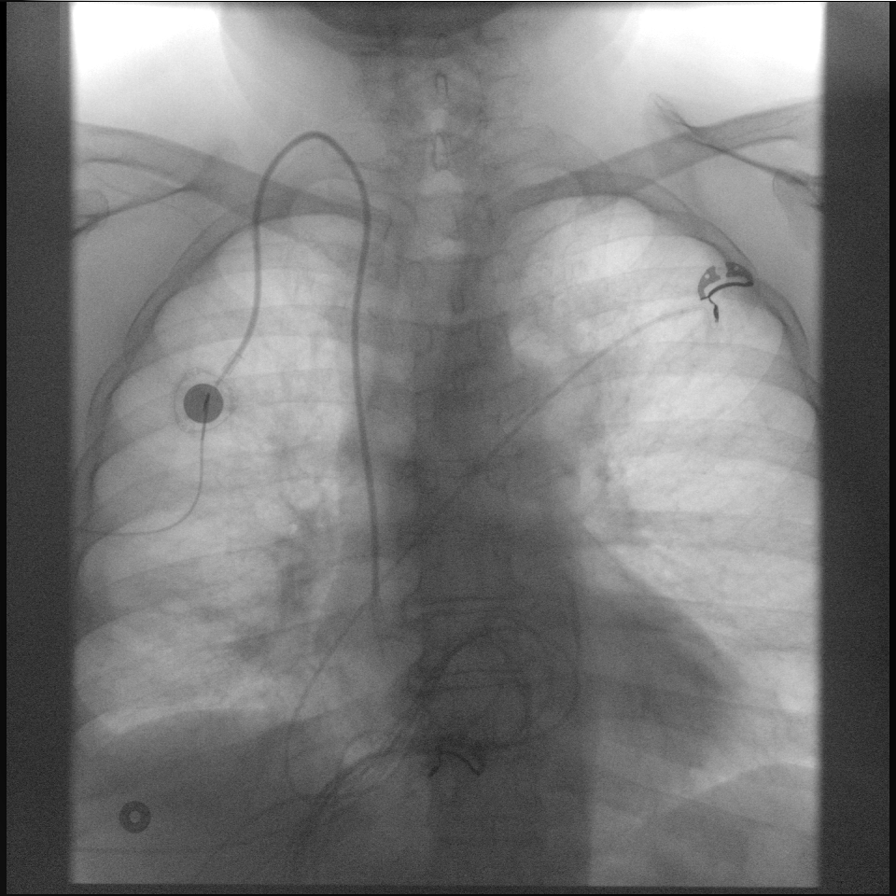
[frame 29/33]
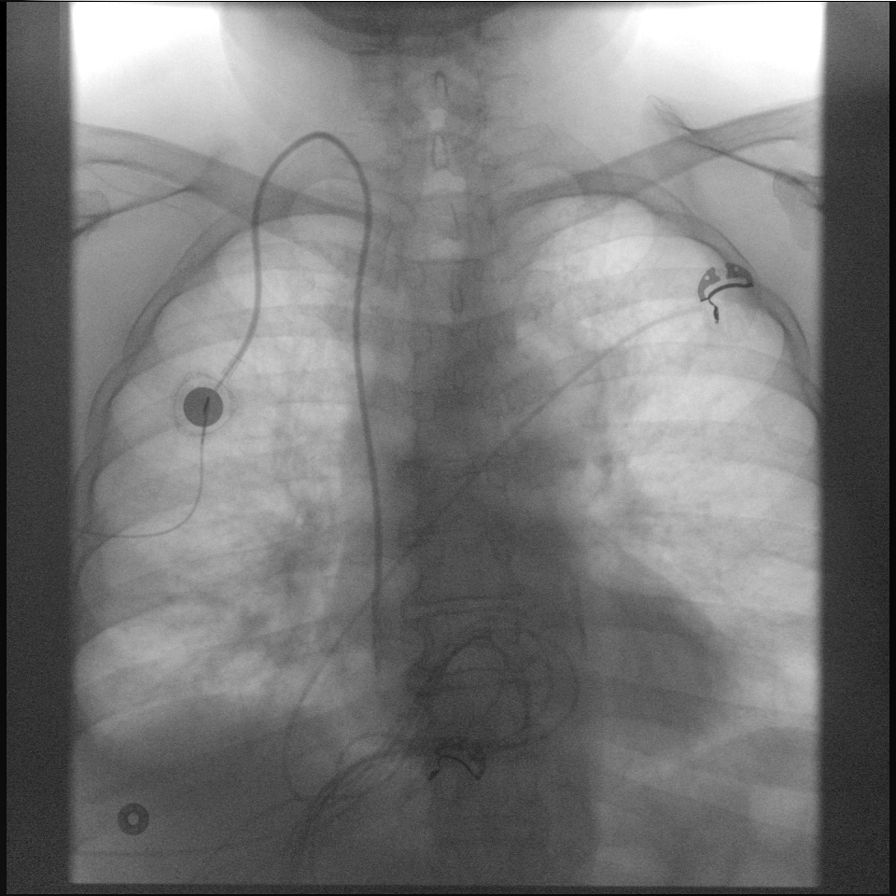

[5 of 5 positions shown; findings below may reference images not displayed]

Survey fluoroscopic inspection reveals stable position of the
tunneled power injectable right IJ port catheter, tip at the
cavoatrial junction. Note: Blood could be easily aspirated from the
port at the beginning and conclusion of the procedure.

Injection demonstrates patency of reservoir and catheter tubing.
Contrast flows freely into the right atrium. No evidence of fibrin
sheath or thrombus.
IMPRESSION: 1. Normal port injection under fluoroscopy. Okay for routine use. If
difficulty with aspiration recurs, consider CathFlow dwell per
protocol.

## 2023-03-30 IMAGING — DX DG CHEST 1V PORT
1 series · 1 of 1 positions shown · non-contrast
Comparison: 06/17/2021, 05/03/2021

CLINICAL DATA: Weakness, sepsis, metastatic lung cancer

EXAM:
PORTABLE CHEST 1 VIEW

[chest ap]
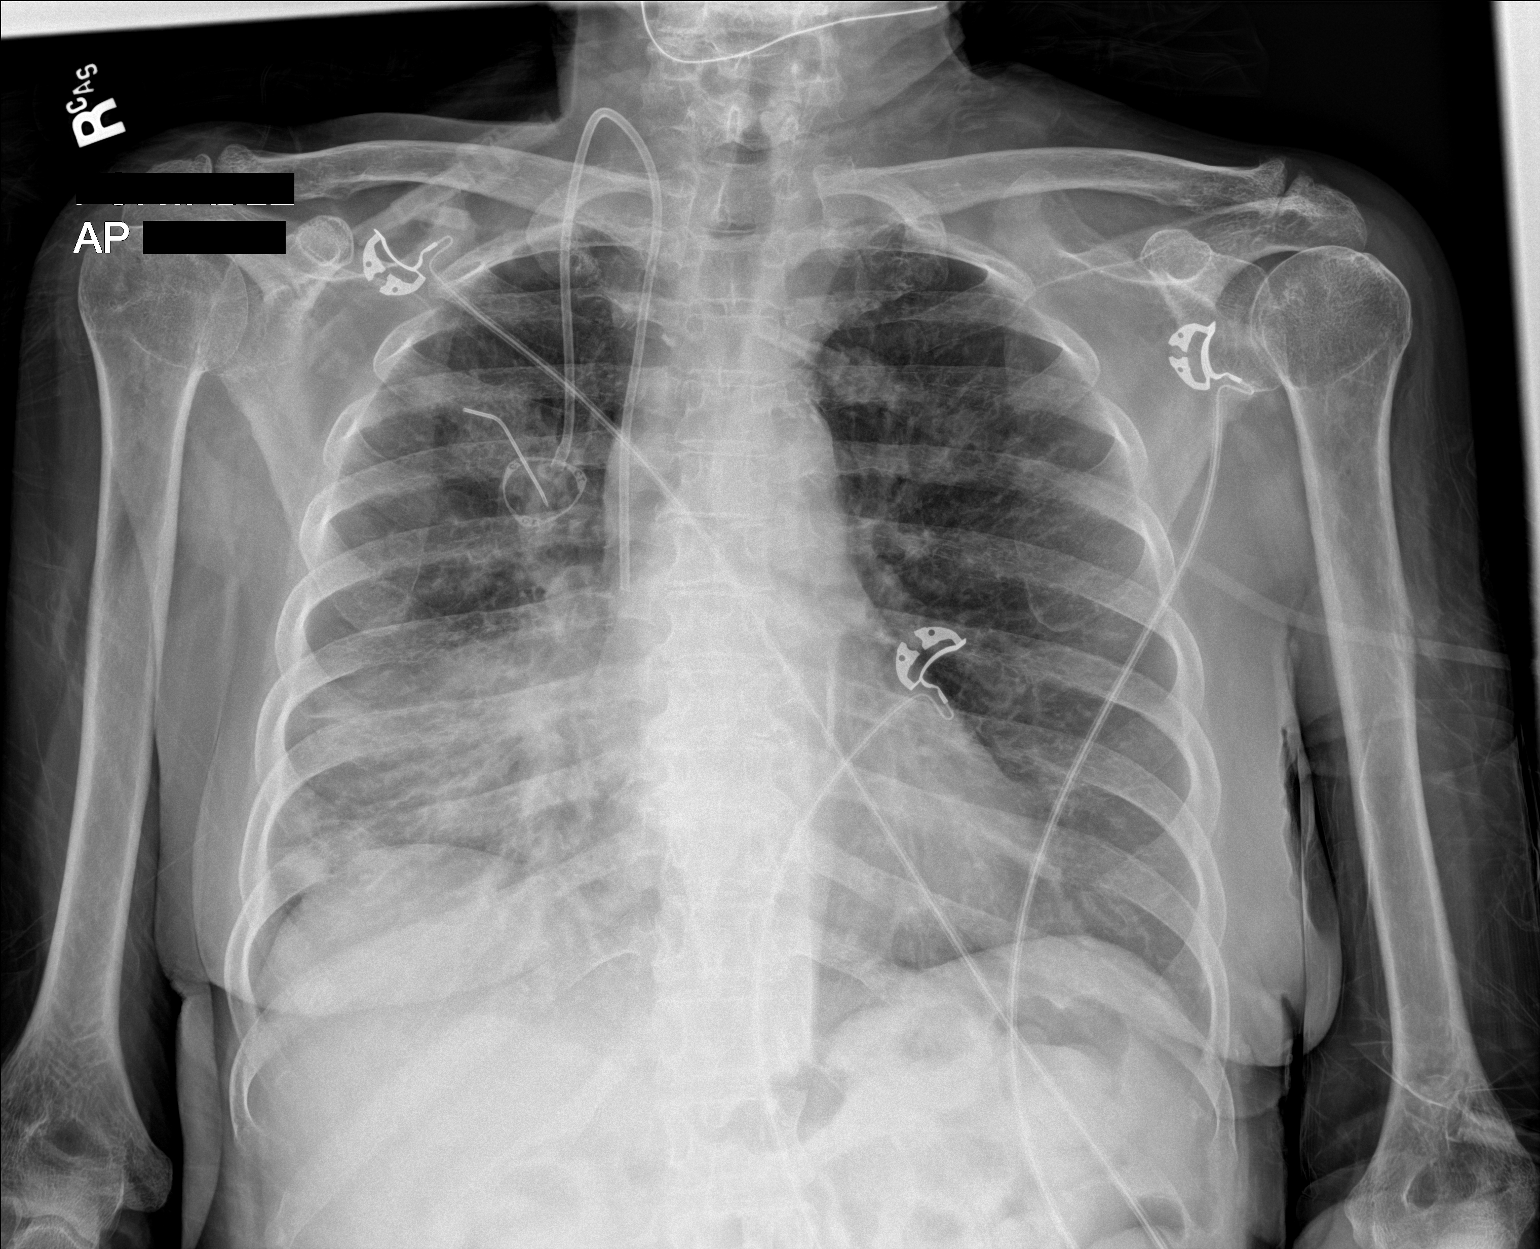

[1 of 1 positions shown; findings below may reference images not displayed]

FINDINGS: Single frontal view of the chest demonstrates right chest wall port
via internal jugular approach tip overlying superior vena cava. The
cardiac silhouette is unremarkable. There is increased consolidation
within the right middle lobe, now obscuring the right hilar mass
seen previously. Nodularity within the lateral right lung base
consistent with metastatic disease observed on previous imaging. No
effusion or pneumothorax. No acute bony abnormalities.
IMPRESSION: 1. Increasing consolidation within the right middle lobe, obscuring
the right hilar mass seen previously. This may reflect progression
of disease and/or increasing postobstructive change.
2. Nodularity at the right lateral lung base consistent with
metastatic disease seen previously.
